# Patient Record
Sex: Male | Born: 1940 | Race: White | Hispanic: No | Smoking: Former smoker
Health system: Southern US, Community
[De-identification: ages and names within clinical notes are randomized; demographics above are authoritative.]

## PROBLEM LIST (undated history)

## (undated) DIAGNOSIS — F410 Panic disorder [episodic paroxysmal anxiety] without agoraphobia: Secondary | ICD-10-CM

## (undated) DIAGNOSIS — I471 Supraventricular tachycardia, unspecified: Secondary | ICD-10-CM

## (undated) DIAGNOSIS — R911 Solitary pulmonary nodule: Secondary | ICD-10-CM

## (undated) DIAGNOSIS — K219 Gastro-esophageal reflux disease without esophagitis: Secondary | ICD-10-CM

## (undated) DIAGNOSIS — K851 Biliary acute pancreatitis without necrosis or infection: Secondary | ICD-10-CM

## (undated) DIAGNOSIS — I10 Essential (primary) hypertension: Secondary | ICD-10-CM

## (undated) DIAGNOSIS — F419 Anxiety disorder, unspecified: Secondary | ICD-10-CM

## (undated) DIAGNOSIS — C801 Malignant (primary) neoplasm, unspecified: Secondary | ICD-10-CM

## (undated) DIAGNOSIS — A419 Sepsis, unspecified organism: Secondary | ICD-10-CM

## (undated) DIAGNOSIS — I5030 Unspecified diastolic (congestive) heart failure: Secondary | ICD-10-CM

## (undated) DIAGNOSIS — I214 Non-ST elevation (NSTEMI) myocardial infarction: Secondary | ICD-10-CM

## (undated) DIAGNOSIS — F1010 Alcohol abuse, uncomplicated: Secondary | ICD-10-CM

## (undated) DIAGNOSIS — I2510 Atherosclerotic heart disease of native coronary artery without angina pectoris: Secondary | ICD-10-CM

## (undated) DIAGNOSIS — D649 Anemia, unspecified: Secondary | ICD-10-CM

## (undated) DIAGNOSIS — E785 Hyperlipidemia, unspecified: Secondary | ICD-10-CM

## (undated) DIAGNOSIS — Z9049 Acquired absence of other specified parts of digestive tract: Secondary | ICD-10-CM

## (undated) DIAGNOSIS — J90 Pleural effusion, not elsewhere classified: Secondary | ICD-10-CM

## (undated) DIAGNOSIS — E039 Hypothyroidism, unspecified: Secondary | ICD-10-CM

## (undated) HISTORY — DX: Solitary pulmonary nodule: R91.1

## (undated) HISTORY — PX: KNEE ARTHROSCOPY: SUR90

## (undated) HISTORY — PX: THORACENTESIS: SHX235

## (undated) HISTORY — DX: Pleural effusion, not elsewhere classified: J90

## (undated) HISTORY — PX: CORONARY ARTERY BYPASS GRAFT: SHX141

## (undated) HISTORY — DX: Anxiety disorder, unspecified: F41.9

## (undated) HISTORY — DX: Alcohol abuse, uncomplicated: F10.10

## (undated) HISTORY — DX: Supraventricular tachycardia, unspecified: I47.10

## (undated) HISTORY — DX: Anemia, unspecified: D64.9

## (undated) HISTORY — DX: Hypercalcemia: E83.52

## (undated) HISTORY — DX: Panic disorder (episodic paroxysmal anxiety): F41.0

## (undated) HISTORY — DX: Sepsis, unspecified organism: A41.9

## (undated) HISTORY — DX: Supraventricular tachycardia: I47.1

## (undated) HISTORY — DX: Biliary acute pancreatitis without necrosis or infection: K85.10

## (undated) HISTORY — DX: Unspecified diastolic (congestive) heart failure: I50.30

---

## 2001-04-03 DIAGNOSIS — F410 Panic disorder [episodic paroxysmal anxiety] without agoraphobia: Secondary | ICD-10-CM | POA: Insufficient documentation

## 2002-06-16 DIAGNOSIS — K219 Gastro-esophageal reflux disease without esophagitis: Secondary | ICD-10-CM

## 2010-08-26 HISTORY — PX: COLON SURGERY: SHX602

## 2011-08-27 HISTORY — PX: COLOSTOMY REVERSAL: SHX5782

## 2013-11-17 ENCOUNTER — Emergency Department: Payer: Self-pay | Admitting: Emergency Medicine

## 2013-11-17 LAB — URINALYSIS, COMPLETE
BLOOD: NEGATIVE
Bacteria: NONE SEEN
Bilirubin,UR: NEGATIVE
Glucose,UR: NEGATIVE mg/dL (ref 0–75)
KETONE: NEGATIVE
Leukocyte Esterase: NEGATIVE
Nitrite: NEGATIVE
PROTEIN: NEGATIVE
Ph: 5 (ref 4.5–8.0)
RBC,UR: NONE SEEN /HPF (ref 0–5)
SPECIFIC GRAVITY: 1.005 (ref 1.003–1.030)
Squamous Epithelial: <1

## 2013-11-17 LAB — CBC WITH DIFFERENTIAL/PLATELET
Basophil #: 0 10*3/uL (ref 0.0–0.1)
Basophil %: 0.6 %
EOS ABS: 0.4 10*3/uL (ref 0.0–0.7)
Eosinophil %: 8.6 %
HCT: 40.7 % (ref 40.0–52.0)
HGB: 13.8 g/dL (ref 13.0–18.0)
LYMPHS ABS: 1.2 10*3/uL (ref 1.0–3.6)
Lymphocyte %: 25.7 %
MCH: 31.7 pg (ref 26.0–34.0)
MCHC: 34 g/dL (ref 32.0–36.0)
MCV: 93 fL (ref 80–100)
MONOS PCT: 13.5 %
Monocyte #: 0.6 x10 3/mm (ref 0.2–1.0)
NEUTROS ABS: 2.4 10*3/uL (ref 1.4–6.5)
Neutrophil %: 51.6 %
Platelet: 175 10*3/uL (ref 150–440)
RBC: 4.37 10*6/uL — AB (ref 4.40–5.90)
RDW: 12.4 % (ref 11.5–14.5)
WBC: 4.7 10*3/uL (ref 3.8–10.6)

## 2013-11-17 LAB — COMPREHENSIVE METABOLIC PANEL
ALBUMIN: 4.2 g/dL (ref 3.4–5.0)
ALK PHOS: 71 U/L
ANION GAP: 5 — AB (ref 7–16)
BUN: 11 mg/dL (ref 7–18)
Bilirubin,Total: 0.7 mg/dL (ref 0.2–1.0)
CALCIUM: 9.2 mg/dL (ref 8.5–10.1)
CHLORIDE: 104 mmol/L (ref 98–107)
Co2: 27 mmol/L (ref 21–32)
Creatinine: 1.16 mg/dL (ref 0.60–1.30)
EGFR (African American): >60
EGFR (Non-African Amer.): >60
Glucose: 127 mg/dL — ABNORMAL HIGH (ref 65–99)
Osmolality: 273 (ref 275–301)
Potassium: 3.6 mmol/L (ref 3.5–5.1)
SGOT(AST): 42 U/L — ABNORMAL HIGH (ref 15–37)
SGPT (ALT): 40 U/L (ref 12–78)
Sodium: 136 mmol/L (ref 136–145)
TOTAL PROTEIN: 8.1 g/dL (ref 6.4–8.2)

## 2014-02-22 DIAGNOSIS — R918 Other nonspecific abnormal finding of lung field: Secondary | ICD-10-CM | POA: Insufficient documentation

## 2014-02-22 DIAGNOSIS — F1011 Alcohol abuse, in remission: Secondary | ICD-10-CM | POA: Insufficient documentation

## 2014-02-22 DIAGNOSIS — F419 Anxiety disorder, unspecified: Secondary | ICD-10-CM | POA: Insufficient documentation

## 2014-02-22 DIAGNOSIS — Z85038 Personal history of other malignant neoplasm of large intestine: Secondary | ICD-10-CM | POA: Insufficient documentation

## 2014-02-22 DIAGNOSIS — Z87891 Personal history of nicotine dependence: Secondary | ICD-10-CM | POA: Insufficient documentation

## 2014-02-22 DIAGNOSIS — K432 Incisional hernia without obstruction or gangrene: Secondary | ICD-10-CM | POA: Insufficient documentation

## 2015-02-03 ENCOUNTER — Encounter: Payer: Self-pay | Admitting: Intensive Care

## 2015-02-03 ENCOUNTER — Emergency Department
Admission: EM | Admit: 2015-02-03 | Discharge: 2015-02-03 | Disposition: A | Discharge status: Home / Self Care | Payer: Medicare Other | Attending: Emergency Medicine | Admitting: Emergency Medicine

## 2015-02-03 ENCOUNTER — Emergency Department: Payer: Medicare Other

## 2015-02-03 DIAGNOSIS — Z87891 Personal history of nicotine dependence: Secondary | ICD-10-CM | POA: Insufficient documentation

## 2015-02-03 DIAGNOSIS — Y9289 Other specified places as the place of occurrence of the external cause: Secondary | ICD-10-CM | POA: Insufficient documentation

## 2015-02-03 DIAGNOSIS — S39012A Strain of muscle, fascia and tendon of lower back, initial encounter: Secondary | ICD-10-CM

## 2015-02-03 DIAGNOSIS — Y93H2 Activity, gardening and landscaping: Secondary | ICD-10-CM | POA: Insufficient documentation

## 2015-02-03 DIAGNOSIS — I10 Essential (primary) hypertension: Secondary | ICD-10-CM | POA: Insufficient documentation

## 2015-02-03 DIAGNOSIS — X58XXXA Exposure to other specified factors, initial encounter: Secondary | ICD-10-CM | POA: Insufficient documentation

## 2015-02-03 DIAGNOSIS — Y998 Other external cause status: Secondary | ICD-10-CM | POA: Insufficient documentation

## 2015-02-03 HISTORY — DX: Malignant (primary) neoplasm, unspecified: C80.1

## 2015-02-03 HISTORY — DX: Gastro-esophageal reflux disease without esophagitis: K21.9

## 2015-02-03 HISTORY — DX: Essential (primary) hypertension: I10

## 2015-02-03 HISTORY — DX: Acquired absence of other specified parts of digestive tract: Z90.49

## 2015-02-03 LAB — URINALYSIS COMPLETE WITH MICROSCOPIC (ARMC ONLY)
Bacteria, UA: NONE SEEN
Bilirubin Urine: NEGATIVE
GLUCOSE, UA: NEGATIVE mg/dL
Hgb urine dipstick: NEGATIVE
Ketones, ur: NEGATIVE mg/dL
LEUKOCYTES UA: NEGATIVE
NITRITE: NEGATIVE
PH: 6 (ref 5.0–8.0)
Protein, ur: NEGATIVE mg/dL
SQUAMOUS EPITHELIAL / LPF: NONE SEEN
Specific Gravity, Urine: 1.004 — ABNORMAL LOW (ref 1.005–1.030)

## 2015-02-03 MED ORDER — DIAZEPAM 2 MG PO TABS: 2.0000 mg | ORAL_TABLET | Freq: Three times a day (TID) | ORAL | Status: DC | PRN

## 2015-02-03 MED ORDER — HYDROCODONE-ACETAMINOPHEN 5-325 MG PO TABS
1.0000 | ORAL_TABLET | Freq: Once | ORAL | Status: AC
  Administered 2015-02-03: 1 via ORAL

## 2015-02-03 MED ORDER — HYDROCODONE-ACETAMINOPHEN 5-325 MG PO TABS: 1.0000 | ORAL_TABLET | ORAL | Status: DC | PRN

## 2015-02-03 MED ORDER — HYDROCODONE-ACETAMINOPHEN 5-325 MG PO TABS
ORAL_TABLET | ORAL | Status: AC
  Filled 2015-02-03: qty 1

## 2015-02-03 NOTE — ED Notes (Signed)
Pt states that he has pain in right lower back that radiated down his right leg. It started 3-4 days ago , and had never had this pain before . No numbness and tingling in the right leg. He feels stabbing pain when he lays on bed.

## 2015-02-03 NOTE — ED Provider Notes (Signed)
[facility] Emergency Department Provider Note  ____________________________________________  Time seen: 1611  I have reviewed the triage vital signs and the nursing notes.   HISTORY  Chief Complaint Back Pain   HPI Preston Bell is a 74 y.o. male is complaining of low back pain. He states approximately 3-4 days ago he was out doing some gardening where he was bent over for some time hoeing.  He denies any fall or trauma to his back. There was no heavy lifting done that day. Since that time he's had right lower back pain radiating into his right leg. He describes it as a stabbing pain when he is lying in bed. He has not had problems such as this in the past. He deniesUTI or kidney stones in the past. He has not taken any medication over-the-counter. He denies any loss of bowel or bladder function. Currently he rates his pain as 10 out of 10.   Past Medical History  Diagnosis Date  . Cancer   . History of colectomy   . Hypertension   . Irregular heart rhythm   . GERD (gastroesophageal reflux disease)     There are no active problems to display for this patient.   Past Surgical History  Procedure Laterality Date  . Colostomy reversal      Current Outpatient Rx  Name  Route  Sig  Dispense  Refill  . diazepam (VALIUM) 2 MG tablet   Oral   Take 1 tablet (2 mg total) by mouth every 8 (eight) hours as needed for muscle spasms.   9 tablet   0   . HYDROcodone-acetaminophen (NORCO/VICODIN) 5-325 MG per tablet   Oral   Take 1 tablet by mouth every 4 (four) hours as needed for moderate pain.   20 tablet   0     Allergies Review of patient's allergies indicates no known allergies.  History reviewed. No pertinent family history.  Social History History  Substance Use Topics  . Smoking status: Former Research scientist (life sciences)  . Smokeless tobacco: Never Used  . Alcohol Use: No    Review of Systems Constitutional: No fever/chills Cardiovascular: Denies chest  pain. Respiratory: Denies shortness of breath. Gastrointestinal: No abdominal pain.  No nausea, no vomiting.  No diarrhea.  No constipation. Genitourinary: Negative for dysuria. Musculoskeletal: Negative for back pain. Skin: Negative for rash. Neurological: Negative for headaches, focal weakness or numbness.  10-point ROS otherwise negative.  ____________________________________________   PHYSICAL EXAM:  VITAL SIGNS: ED Triage Vitals  Enc Vitals Group     BP --      Pulse --      Resp --      Temp --      Temp src --      SpO2 --      Weight --      Height --      Head Cir --      Peak Flow --      Pain Score 02/03/15 1546 10     Pain Loc --      Pain Edu? --      Excl. in [place]? --     Constitutional: Alert and oriented. Well appearing and in no acute distress. Eyes: Conjunctivae are normal. PERRL. EOMI. Head: Atraumatic. Nose: No congestion/rhinnorhea. Neck: No stridor.   Cardiovascular: Normal rate, regular rhythm. Grossly normal heart sounds.  Good peripheral circulation. Respiratory: Normal respiratory effort.  No retractions. Lungs CTAB. Gastrointestinal: Soft and nontender. No distention. No abdominal  bruits. No CVA tenderness. Musculoskeletal: No lower extremity tenderness nor edema.  No joint effusions. Examination of the back did not show any gross deformity. Range of motion is guarded secondary to pain. There is moderate tenderness on palpation of the right lateral muscle area. There is minimal tenderness on palpation of the vertebral lumbosacral area L5-S1 area. Straight leg raises are 90 and negative bilaterally. Strength in both legs are grossly equal bilaterally. Neurologic:  Normal speech and language. No gross focal neurologic deficits are appreciated. Speech is normal. No gait instability. Reflexes are 2+ bilaterally Skin:  Skin is warm, dry and intact. No rash noted. Psychiatric: Mood and affect are normal. Speech and behavior are  normal.  ____________________________________________   LABS (all labs ordered are listed, but only abnormal results are displayed)  Labs Reviewed  URINALYSIS COMPLETEWITH MICROSCOPIC ([place] ONLY) - Abnormal; Notable for the following:    Color, Urine STRAW (*)    APPearance CLEAR (*)    Specific Gravity, Urine 1.004 (*)    All other components within normal limits   RADIOLOGY  Lumbar x-rays showed no acute bony abnormality. Patient has degenerative changes on multiple levels. Thoracic spine x-rays showed no bony or abnormalities and minimal spine degenerative changes. ____________________________________________   PROCEDURES  Procedure(s) performed: None  Critical Care performed: No  ____________________________________________   INITIAL IMPRESSION / ASSESSMENT AND PLAN / ED COURSE  Pertinent labs & imaging results that were available during my care of the patient were reviewed by me and considered in my medical decision making (see chart for details).  Patient was started on Norco for pain and also Valium 2 mg as needed for muscle spasms. He is aware that the 2 medications can cause drowsiness increase his risk for fall. He is also sees ice or heat to his back as needed. He is to follow-up with his doctor at [facility] if any continued problems. ____________________________________________   FINAL CLINICAL IMPRESSION(S) / ED DIAGNOSES  Final diagnoses:  Back strain, initial encounter      Johnn Hai, PA-C 02/03/15 1744  Harvest Dark, MD 02/03/15 2325

## 2015-02-03 NOTE — Discharge Instructions (Signed)
° °  ICE OR HEAT TO MUSCLE AREAS AS NEEDED FOR COMFORT BE AWARE THAT THESE MEDICATIONS COULD CAUSE DROWSINESS AND INCREASE YOUR RISK OF FALLING RETURN TO ER IF ANY SEVERE WORSENING OF YOUR SYMPTOMS OR URGENT CONCERNS

## 2015-02-03 NOTE — ED Notes (Signed)
PT C/O right lower back pain that radiates to right thigh. Pt states he was gardening the day before back pain started. Pt denies urinary symptoms.

## 2015-06-14 DIAGNOSIS — R634 Abnormal weight loss: Secondary | ICD-10-CM | POA: Insufficient documentation

## 2015-09-27 DIAGNOSIS — I214 Non-ST elevation (NSTEMI) myocardial infarction: Secondary | ICD-10-CM

## 2015-09-27 HISTORY — DX: Non-ST elevation (NSTEMI) myocardial infarction: I21.4

## 2015-10-05 ENCOUNTER — Other Ambulatory Visit: Payer: Self-pay

## 2015-10-05 ENCOUNTER — Encounter: Payer: Self-pay | Admitting: Anesthesiology

## 2015-10-05 ENCOUNTER — Emergency Department: Payer: Medicare Other

## 2015-10-05 ENCOUNTER — Inpatient Hospital Stay
Admission: EM | Admit: 2015-10-05 | Discharge: 2015-10-09 | DRG: 871 | Disposition: A | Discharge status: Other Acute Inpatient Hospital | Payer: Medicare Other | Attending: Specialist | Admitting: Specialist

## 2015-10-05 ENCOUNTER — Inpatient Hospital Stay: Payer: Medicare Other

## 2015-10-05 ENCOUNTER — Inpatient Hospital Stay
Admit: 2015-10-05 | Discharge: 2015-10-05 | Disposition: A | Discharge status: Home / Self Care | Payer: Medicare Other | Attending: General Surgery | Admitting: General Surgery

## 2015-10-05 ENCOUNTER — Encounter
Admission: EM | Disposition: A | Discharge status: Other Acute Inpatient Hospital | Payer: Self-pay | Source: Home / Self Care | Attending: Specialist

## 2015-10-05 ENCOUNTER — Encounter: Payer: Self-pay | Admitting: Emergency Medicine

## 2015-10-05 DIAGNOSIS — Z8049 Family history of malignant neoplasm of other genital organs: Secondary | ICD-10-CM

## 2015-10-05 DIAGNOSIS — Z9049 Acquired absence of other specified parts of digestive tract: Secondary | ICD-10-CM | POA: Diagnosis not present

## 2015-10-05 DIAGNOSIS — E785 Hyperlipidemia, unspecified: Secondary | ICD-10-CM | POA: Diagnosis present

## 2015-10-05 DIAGNOSIS — A419 Sepsis, unspecified organism: Secondary | ICD-10-CM | POA: Diagnosis not present

## 2015-10-05 DIAGNOSIS — R197 Diarrhea, unspecified: Secondary | ICD-10-CM

## 2015-10-05 DIAGNOSIS — R109 Unspecified abdominal pain: Secondary | ICD-10-CM | POA: Insufficient documentation

## 2015-10-05 DIAGNOSIS — R778 Other specified abnormalities of plasma proteins: Secondary | ICD-10-CM

## 2015-10-05 DIAGNOSIS — K219 Gastro-esophageal reflux disease without esophagitis: Secondary | ICD-10-CM | POA: Diagnosis present

## 2015-10-05 DIAGNOSIS — Z79899 Other long term (current) drug therapy: Secondary | ICD-10-CM

## 2015-10-05 DIAGNOSIS — I471 Supraventricular tachycardia: Secondary | ICD-10-CM | POA: Diagnosis present

## 2015-10-05 DIAGNOSIS — Z7982 Long term (current) use of aspirin: Secondary | ICD-10-CM | POA: Diagnosis not present

## 2015-10-05 DIAGNOSIS — R7989 Other specified abnormal findings of blood chemistry: Secondary | ICD-10-CM

## 2015-10-05 DIAGNOSIS — R1084 Generalized abdominal pain: Secondary | ICD-10-CM | POA: Diagnosis not present

## 2015-10-05 DIAGNOSIS — K8000 Calculus of gallbladder with acute cholecystitis without obstruction: Secondary | ICD-10-CM | POA: Diagnosis present

## 2015-10-05 DIAGNOSIS — I340 Nonrheumatic mitral (valve) insufficiency: Secondary | ICD-10-CM | POA: Diagnosis not present

## 2015-10-05 DIAGNOSIS — A4189 Other specified sepsis: Principal | ICD-10-CM | POA: Diagnosis present

## 2015-10-05 DIAGNOSIS — I2510 Atherosclerotic heart disease of native coronary artery without angina pectoris: Secondary | ICD-10-CM | POA: Diagnosis not present

## 2015-10-05 DIAGNOSIS — Z8249 Family history of ischemic heart disease and other diseases of the circulatory system: Secondary | ICD-10-CM

## 2015-10-05 DIAGNOSIS — Z85038 Personal history of other malignant neoplasm of large intestine: Secondary | ICD-10-CM

## 2015-10-05 DIAGNOSIS — I252 Old myocardial infarction: Secondary | ICD-10-CM | POA: Diagnosis not present

## 2015-10-05 DIAGNOSIS — E860 Dehydration: Secondary | ICD-10-CM | POA: Diagnosis present

## 2015-10-05 DIAGNOSIS — Z801 Family history of malignant neoplasm of trachea, bronchus and lung: Secondary | ICD-10-CM | POA: Diagnosis not present

## 2015-10-05 DIAGNOSIS — I10 Essential (primary) hypertension: Secondary | ICD-10-CM | POA: Diagnosis present

## 2015-10-05 DIAGNOSIS — B961 Klebsiella pneumoniae [K. pneumoniae] as the cause of diseases classified elsewhere: Secondary | ICD-10-CM | POA: Diagnosis present

## 2015-10-05 DIAGNOSIS — Z87891 Personal history of nicotine dependence: Secondary | ICD-10-CM

## 2015-10-05 DIAGNOSIS — K819 Cholecystitis, unspecified: Secondary | ICD-10-CM

## 2015-10-05 DIAGNOSIS — I214 Non-ST elevation (NSTEMI) myocardial infarction: Secondary | ICD-10-CM | POA: Diagnosis not present

## 2015-10-05 DIAGNOSIS — R1110 Vomiting, unspecified: Secondary | ICD-10-CM

## 2015-10-05 HISTORY — DX: Hyperlipidemia, unspecified: E78.5

## 2015-10-05 LAB — URINALYSIS COMPLETE WITH MICROSCOPIC (ARMC ONLY)
BACTERIA UA: NONE SEEN
Bilirubin Urine: NEGATIVE
GLUCOSE, UA: NEGATIVE mg/dL
Ketones, ur: NEGATIVE mg/dL
Leukocytes, UA: NEGATIVE
Nitrite: NEGATIVE
Protein, ur: NEGATIVE mg/dL
Specific Gravity, Urine: 1.011 (ref 1.005–1.030)
pH: 7 (ref 5.0–8.0)

## 2015-10-05 LAB — BLOOD CULTURE ID PANEL (REFLEXED)
ACINETOBACTER BAUMANNII: NOT DETECTED
CANDIDA PARAPSILOSIS: NOT DETECTED
Candida albicans: NOT DETECTED
Candida glabrata: NOT DETECTED
Candida krusei: NOT DETECTED
Candida tropicalis: NOT DETECTED
Carbapenem resistance: NOT DETECTED
ENTEROCOCCUS SPECIES: NOT DETECTED
Enterobacter cloacae complex: NOT DETECTED
Enterobacteriaceae species: DETECTED — AB
Escherichia coli: NOT DETECTED
HAEMOPHILUS INFLUENZAE: NOT DETECTED
Klebsiella oxytoca: NOT DETECTED
Klebsiella pneumoniae: DETECTED — AB
LISTERIA MONOCYTOGENES: NOT DETECTED
METHICILLIN RESISTANCE: NOT DETECTED
Neisseria meningitidis: NOT DETECTED
PSEUDOMONAS AERUGINOSA: NOT DETECTED
Proteus species: NOT DETECTED
SERRATIA MARCESCENS: NOT DETECTED
STAPHYLOCOCCUS AUREUS BCID: NOT DETECTED
STREPTOCOCCUS SPECIES: NOT DETECTED
Staphylococcus species: NOT DETECTED
Streptococcus agalactiae: NOT DETECTED
Streptococcus pneumoniae: NOT DETECTED
Streptococcus pyogenes: NOT DETECTED
VANCOMYCIN RESISTANCE: NOT DETECTED

## 2015-10-05 LAB — COMPREHENSIVE METABOLIC PANEL
ALBUMIN: 4.8 g/dL (ref 3.5–5.0)
ALT: 173 U/L — ABNORMAL HIGH (ref 17–63)
ANION GAP: 21 — AB (ref 5–15)
AST: 190 U/L — ABNORMAL HIGH (ref 15–41)
Alkaline Phosphatase: 125 U/L (ref 38–126)
BUN: 21 mg/dL — ABNORMAL HIGH (ref 6–20)
CO2: 16 mmol/L — AB (ref 22–32)
Calcium: 9.8 mg/dL (ref 8.9–10.3)
Chloride: 104 mmol/L (ref 101–111)
Creatinine, Ser: 1.51 mg/dL — ABNORMAL HIGH (ref 0.61–1.24)
GFR calc Af Amer: 51 mL/min — ABNORMAL LOW (ref >60)
GFR calc non Af Amer: 44 mL/min — ABNORMAL LOW (ref >60)
GLUCOSE: 216 mg/dL — AB (ref 65–99)
POTASSIUM: 3.6 mmol/L (ref 3.5–5.1)
Sodium: 141 mmol/L (ref 135–145)
TOTAL PROTEIN: 8.4 g/dL — AB (ref 6.5–8.1)
Total Bilirubin: 1.9 mg/dL — ABNORMAL HIGH (ref 0.3–1.2)

## 2015-10-05 LAB — APTT: aPTT: 31 seconds (ref 24–36)

## 2015-10-05 LAB — CBC WITH DIFFERENTIAL/PLATELET
BASOS PCT: 0 %
Basophils Absolute: 0.1 10*3/uL (ref 0–0.1)
EOS ABS: 0 10*3/uL (ref 0–0.7)
Eosinophils Relative: 0 %
HCT: 39.3 % — ABNORMAL LOW (ref 40.0–52.0)
Hemoglobin: 13.2 g/dL (ref 13.0–18.0)
Lymphocytes Relative: 4 %
Lymphs Abs: 0.8 10*3/uL — ABNORMAL LOW (ref 1.0–3.6)
MCH: 32.4 pg (ref 26.0–34.0)
MCHC: 33.6 g/dL (ref 32.0–36.0)
MCV: 96.4 fL (ref 80.0–100.0)
MONO ABS: 0.7 10*3/uL (ref 0.2–1.0)
Monocytes Relative: 3 %
NEUTROS PCT: 93 %
Neutro Abs: 21.6 10*3/uL — ABNORMAL HIGH (ref 1.4–6.5)
PLATELETS: 238 10*3/uL (ref 150–440)
RBC: 4.07 MIL/uL — ABNORMAL LOW (ref 4.40–5.90)
RDW: 14.1 % (ref 11.5–14.5)
WBC: 23.1 10*3/uL — ABNORMAL HIGH (ref 3.8–10.6)

## 2015-10-05 LAB — GLUCOSE, CAPILLARY: GLUCOSE-CAPILLARY: 124 mg/dL — AB (ref 65–99)

## 2015-10-05 LAB — BASIC METABOLIC PANEL
Anion gap: 7 (ref 5–15)
BUN: 19 mg/dL (ref 6–20)
CALCIUM: 7.9 mg/dL — AB (ref 8.9–10.3)
CO2: 24 mmol/L (ref 22–32)
CREATININE: 1.07 mg/dL (ref 0.61–1.24)
Chloride: 105 mmol/L (ref 101–111)
Glucose, Bld: 151 mg/dL — ABNORMAL HIGH (ref 65–99)
Potassium: 4.1 mmol/L (ref 3.5–5.1)
SODIUM: 136 mmol/L (ref 135–145)

## 2015-10-05 LAB — LACTIC ACID, PLASMA
LACTIC ACID, VENOUS: 3.2 mmol/L — AB (ref 0.5–2.0)
Lactic Acid, Venous: 8.9 mmol/L (ref 0.5–2.0)

## 2015-10-05 LAB — TROPONIN I
TROPONIN I: 7.74 ng/mL — AB (ref <0.031)
Troponin I: 0.04 ng/mL — ABNORMAL HIGH (ref <0.031)
Troponin I: 13.17 ng/mL — ABNORMAL HIGH (ref <0.031)

## 2015-10-05 LAB — PROTIME-INR
INR: 1.18
PROTHROMBIN TIME: 15.2 s — AB (ref 11.4–15.0)

## 2015-10-05 LAB — MAGNESIUM: MAGNESIUM: 1.1 mg/dL — AB (ref 1.7–2.4)

## 2015-10-05 LAB — LIPASE, BLOOD: Lipase: 366 U/L — ABNORMAL HIGH (ref 11–51)

## 2015-10-05 SURGERY — LAPAROSCOPIC CHOLECYSTECTOMY WITH INTRAOPERATIVE CHOLANGIOGRAM
Anesthesia: Choice

## 2015-10-05 MED ORDER — PIPERACILLIN-TAZOBACTAM 3.375 G IVPB
3.3750 g | Freq: Once | INTRAVENOUS | Status: AC
  Administered 2015-10-05: 3.375 g via INTRAVENOUS
  Filled 2015-10-05: qty 50

## 2015-10-05 MED ORDER — ATORVASTATIN CALCIUM 20 MG PO TABS
40.0000 mg | ORAL_TABLET | Freq: Every day | ORAL | Status: DC
  Administered 2015-10-05 – 2015-10-08 (×4): 40 mg via ORAL
  Filled 2015-10-05 (×4): qty 2

## 2015-10-05 MED ORDER — LIDOCAINE HCL (PF) 1 % IJ SOLN
INTRAMUSCULAR | Status: DC | PRN
  Administered 2015-10-05: 10 mL

## 2015-10-05 MED ORDER — DIPHENHYDRAMINE HCL 50 MG/ML IJ SOLN: 12.5000 mg | Freq: Four times a day (QID) | INTRAMUSCULAR | Status: DC | PRN

## 2015-10-05 MED ORDER — LACTATED RINGERS IV BOLUS (SEPSIS)
500.0000 mL | Freq: Once | INTRAVENOUS | Status: AC
  Administered 2015-10-05: 500 mL via INTRAVENOUS

## 2015-10-05 MED ORDER — MIRTAZAPINE 15 MG PO TABS
15.0000 mg | ORAL_TABLET | Freq: Every day | ORAL | Status: DC
  Administered 2015-10-05 – 2015-10-08 (×4): 15 mg via ORAL
  Filled 2015-10-05 (×4): qty 1

## 2015-10-05 MED ORDER — FENTANYL CITRATE (PF) 100 MCG/2ML IJ SOLN
INTRAMUSCULAR | Status: AC | PRN
  Administered 2015-10-05: 25 ug via INTRAVENOUS
  Administered 2015-10-05: 50 ug via INTRAVENOUS
  Administered 2015-10-05: 25 ug via INTRAVENOUS

## 2015-10-05 MED ORDER — DILTIAZEM HCL 25 MG/5ML IV SOLN
10.0000 mg | Freq: Once | INTRAVENOUS | Status: AC
  Administered 2015-10-05: 10 mg via INTRAVENOUS
  Filled 2015-10-05: qty 5

## 2015-10-05 MED ORDER — HYDROMORPHONE HCL 1 MG/ML IJ SOLN
1.0000 mg | Freq: Once | INTRAMUSCULAR | Status: AC
  Administered 2015-10-05: 1 mg via INTRAVENOUS

## 2015-10-05 MED ORDER — ASPIRIN 325 MG PO TABS
325.0000 mg | ORAL_TABLET | Freq: Once | ORAL | Status: AC
  Administered 2015-10-05: 325 mg via ORAL

## 2015-10-05 MED ORDER — LISINOPRIL 20 MG PO TABS
40.0000 mg | ORAL_TABLET | Freq: Every day | ORAL | Status: DC
  Administered 2015-10-06: 40 mg via ORAL
  Filled 2015-10-05 (×2): qty 2

## 2015-10-05 MED ORDER — HEPARIN BOLUS VIA INFUSION
4000.0000 [IU] | Freq: Once | INTRAVENOUS | Status: AC
  Administered 2015-10-05: 4000 [IU] via INTRAVENOUS
  Filled 2015-10-05: qty 4000

## 2015-10-05 MED ORDER — MIDAZOLAM HCL 5 MG/5ML IJ SOLN
INTRAMUSCULAR | Status: AC
  Filled 2015-10-05: qty 5

## 2015-10-05 MED ORDER — MORPHINE SULFATE (PF) 4 MG/ML IV SOLN
4.0000 mg | Freq: Once | INTRAVENOUS | Status: AC
  Administered 2015-10-05: 4 mg via INTRAVENOUS
  Filled 2015-10-05: qty 1

## 2015-10-05 MED ORDER — SODIUM CHLORIDE 0.9 % IV SOLN
INTRAVENOUS | Status: DC
  Administered 2015-10-05 – 2015-10-08 (×4): via INTRAVENOUS

## 2015-10-05 MED ORDER — MORPHINE SULFATE (PF) 4 MG/ML IV SOLN
4.0000 mg | INTRAVENOUS | Status: DC | PRN
  Administered 2015-10-05 – 2015-10-08 (×7): 4 mg via INTRAVENOUS
  Filled 2015-10-05 (×7): qty 1

## 2015-10-05 MED ORDER — ASPIRIN EC 81 MG PO TBEC
81.0000 mg | DELAYED_RELEASE_TABLET | Freq: Every day | ORAL | Status: DC
  Administered 2015-10-05: 325 mg via ORAL
  Administered 2015-10-05 – 2015-10-08 (×4): 81 mg via ORAL
  Filled 2015-10-05 (×4): qty 1

## 2015-10-05 MED ORDER — VANCOMYCIN HCL IN DEXTROSE 1-5 GM/200ML-% IV SOLN
1000.0000 mg | Freq: Once | INTRAVENOUS | Status: AC
  Administered 2015-10-05: 1000 mg via INTRAVENOUS
  Filled 2015-10-05: qty 200

## 2015-10-05 MED ORDER — ADENOSINE 6 MG/2ML IV SOLN
INTRAVENOUS | Status: AC
  Administered 2015-10-05: 6 mg via INTRAVENOUS
  Filled 2015-10-05: qty 2

## 2015-10-05 MED ORDER — LACTATED RINGERS IV SOLN
INTRAVENOUS | Status: DC
  Administered 2015-10-05 – 2015-10-06 (×3): via INTRAVENOUS

## 2015-10-05 MED ORDER — IOHEXOL 240 MG/ML SOLN
25.0000 mL | Freq: Once | INTRAMUSCULAR | Status: AC | PRN
  Administered 2015-10-05: 25 mL via ORAL

## 2015-10-05 MED ORDER — FENTANYL CITRATE (PF) 100 MCG/2ML IJ SOLN
INTRAMUSCULAR | Status: AC
  Filled 2015-10-05: qty 4

## 2015-10-05 MED ORDER — PANTOPRAZOLE SODIUM 40 MG IV SOLR
40.0000 mg | Freq: Every day | INTRAVENOUS | Status: DC
  Administered 2015-10-05 – 2015-10-08 (×4): 40 mg via INTRAVENOUS
  Filled 2015-10-05 (×4): qty 40

## 2015-10-05 MED ORDER — HYDRALAZINE HCL 20 MG/ML IJ SOLN
10.0000 mg | INTRAMUSCULAR | Status: DC | PRN
Start: 2015-10-05 — End: 2015-10-09

## 2015-10-05 MED ORDER — SODIUM CHLORIDE 0.9 % IV BOLUS (SEPSIS)
1310.0000 mL | Freq: Once | INTRAVENOUS | Status: AC
  Administered 2015-10-05: 1000 mL via INTRAVENOUS

## 2015-10-05 MED ORDER — DIPHENHYDRAMINE HCL 12.5 MG/5ML PO ELIX
12.5000 mg | ORAL_SOLUTION | Freq: Four times a day (QID) | ORAL | Status: DC | PRN
  Filled 2015-10-05: qty 5

## 2015-10-05 MED ORDER — IOHEXOL 300 MG/ML  SOLN
30.0000 mL | Freq: Once | INTRAMUSCULAR | Status: AC | PRN
  Administered 2015-10-05: 5 mL

## 2015-10-05 MED ORDER — SODIUM CHLORIDE 0.9 % IV BOLUS (SEPSIS)
1000.0000 mL | Freq: Once | INTRAVENOUS | Status: AC
  Administered 2015-10-05: 1000 mL via INTRAVENOUS

## 2015-10-05 MED ORDER — MAGNESIUM SULFATE 4 GM/100ML IV SOLN
4.0000 g | Freq: Once | INTRAVENOUS | Status: AC
  Administered 2015-10-05: 4 g via INTRAVENOUS
  Filled 2015-10-05: qty 100

## 2015-10-05 MED ORDER — ENOXAPARIN SODIUM 40 MG/0.4ML ~~LOC~~ SOLN: 40.0000 mg | SUBCUTANEOUS | Status: DC

## 2015-10-05 MED ORDER — ONDANSETRON HCL 4 MG/2ML IJ SOLN
4.0000 mg | Freq: Once | INTRAMUSCULAR | Status: AC
  Administered 2015-10-05: 4 mg via INTRAVENOUS
  Filled 2015-10-05: qty 2

## 2015-10-05 MED ORDER — PANTOPRAZOLE SODIUM 40 MG PO TBEC: 40.0000 mg | DELAYED_RELEASE_TABLET | Freq: Every day | ORAL | Status: DC

## 2015-10-05 MED ORDER — ADENOSINE 6 MG/2ML IV SOLN
6.0000 mg | Freq: Once | INTRAVENOUS | Status: AC
  Administered 2015-10-05: 6 mg via INTRAVENOUS

## 2015-10-05 MED ORDER — IOHEXOL 300 MG/ML  SOLN
80.0000 mL | Freq: Once | INTRAMUSCULAR | Status: AC | PRN
  Administered 2015-10-05: 80 mL via INTRAVENOUS

## 2015-10-05 MED ORDER — PIPERACILLIN-TAZOBACTAM 3.375 G IVPB
3.3750 g | Freq: Three times a day (TID) | INTRAVENOUS | Status: AC
  Administered 2015-10-05 – 2015-10-06 (×4): 3.375 g via INTRAVENOUS
  Filled 2015-10-05 (×5): qty 50

## 2015-10-05 MED ORDER — METOPROLOL SUCCINATE ER 50 MG PO TB24
50.0000 mg | ORAL_TABLET | Freq: Every day | ORAL | Status: DC
  Administered 2015-10-05 – 2015-10-06 (×2): 50 mg via ORAL
  Filled 2015-10-05 (×3): qty 1

## 2015-10-05 MED ORDER — HEPARIN (PORCINE) IN NACL 100-0.45 UNIT/ML-% IJ SOLN
1550.0000 [IU]/h | INTRAMUSCULAR | Status: DC
  Administered 2015-10-05: 950 [IU]/h via INTRAVENOUS
  Administered 2015-10-07: 1400 [IU]/h via INTRAVENOUS
  Administered 2015-10-08 – 2015-10-09 (×2): 1550 [IU]/h via INTRAVENOUS
  Filled 2015-10-05 (×10): qty 250

## 2015-10-05 MED ORDER — ONDANSETRON HCL 4 MG/2ML IJ SOLN
4.0000 mg | Freq: Four times a day (QID) | INTRAMUSCULAR | Status: DC | PRN
  Administered 2015-10-06: 4 mg via INTRAVENOUS
  Filled 2015-10-05: qty 2

## 2015-10-05 MED ORDER — MIDAZOLAM HCL 5 MG/5ML IJ SOLN
INTRAMUSCULAR | Status: AC | PRN
  Administered 2015-10-05: 1 mg via INTRAVENOUS
  Administered 2015-10-05: 0.5 mg via INTRAVENOUS
  Administered 2015-10-05: 2 mg via INTRAVENOUS

## 2015-10-05 MED ORDER — HYDROMORPHONE HCL 1 MG/ML IJ SOLN
INTRAMUSCULAR | Status: AC
  Administered 2015-10-05: 1 mg via INTRAVENOUS
  Filled 2015-10-05: qty 1

## 2015-10-05 MED ORDER — ONDANSETRON 4 MG PO TBDP
4.0000 mg | ORAL_TABLET | Freq: Four times a day (QID) | ORAL | Status: DC | PRN
  Filled 2015-10-05: qty 1

## 2015-10-05 NOTE — Consult Note (Signed)
[place] [facility] [place] NAME: Preston Bell    MR#:  [id number]  DATE OF BIRTH:  Nov 23, 1940  DATE OF CONSULT:  10/05/2015  PRIMARY CARE PHYSICIAN: Pcp Not In System   REQUESTING/REFERRING PHYSICIAN: Dr. Adonis Huguenin  CHIEF COMPLAINT:   Chief Complaint  Patient presents with  . Abdominal Pain    HISTORY OF PRESENT ILLNESS:  Preston Bell  is a 75 y.o. male with a known history of colon cancer, hypertension, GERD, history of tobacco and alcohol abuse, anxiety who presented to the hospital complaining of right upper quadrant abdominal pain associated with nausea and vomiting. Patient's abdominal pain and nausea vomiting began yesterday and have progressively gotten worse and therefore he came to the ER for further evaluation. In the emergency room patient underwent a CT scan of the abdomen and pelvis which was suggestive of acute cholecystitis. She was noted to be in SVT and unstable for a laparoscopic cholecystectomy and therefore was taken for a percutaneous cholecystostomy tube drainage. Patient post percutaneous drainage feels better and his heart rate has improved. Hospitalist services were contacted for medical management. Patient presently denies any chest pain, shortness of breath, nausea, vomiting, fever, chills, syncope or any other associated symptoms presently.  PAST MEDICAL HISTORY:   Past Medical History  Diagnosis Date  . Cancer ([place])   . History of colectomy   . Hypertension   . Irregular heart rhythm   . GERD (gastroesophageal reflux disease)     PAST SURGICAL HISTOIRY:   Past Surgical History  Procedure Laterality Date  . Colostomy reversal      SOCIAL HISTORY:   Social History  Substance Use Topics  . Smoking status: Former Smoker -- 1.00 packs/day for 30 years  . Smokeless tobacco: Never Used  . Alcohol Use: No    FAMILY HISTORY:   Family History  Problem Relation Age of Onset  . Cervical cancer Mother    . Lung cancer Father   . Lung cancer Brother     DRUG ALLERGIES:  No Known Allergies  REVIEW OF SYSTEMS:   Review of Systems  Constitutional: Negative for fever and weight loss.  HENT: Negative for congestion, nosebleeds and tinnitus.   Eyes: Negative for blurred vision, double vision and redness.  Respiratory: Negative for cough, hemoptysis and shortness of breath.   Cardiovascular: Negative for chest pain, orthopnea, leg swelling and PND.  Gastrointestinal: Positive for abdominal pain (right upper quadrant). Negative for diarrhea and melena.  Genitourinary: Negative for dysuria, urgency and hematuria.  Musculoskeletal: Negative for joint pain and falls.  Neurological: Negative for dizziness, tingling, sensory change, focal weakness, seizures, weakness and headaches.  Endo/Heme/Allergies: Negative for polydipsia. Does not bruise/bleed easily.  Psychiatric/Behavioral: Negative for depression and memory loss. The patient is not nervous/anxious.      MEDICATIONS AT HOME:   Prior to Admission medications   Medication Sig Start Date End Date Taking? Authorizing Provider  aspirin EC 81 MG tablet Take 81 mg by mouth daily. 06/14/15 02/20/29 Yes Historical Provider, MD  atorvastatin (LIPITOR) 20 MG tablet Take 40 mg by mouth daily. 06/14/15 06/13/16 Yes Historical Provider, MD  lisinopril (PRINIVIL,ZESTRIL) 40 MG tablet Take 40 mg by mouth daily. 06/14/15 06/13/16 Yes Historical Provider, MD  metoprolol succinate (TOPROL XL) 50 MG 24 hr tablet Take 50 mg by mouth daily. 06/14/15 06/13/16 Yes Historical Provider, MD  mirtazapine (REMERON) 15 MG tablet Take 15 mg by mouth at bedtime. 06/14/15 06/13/16 Yes Historical Provider, MD  omeprazole (PRILOSEC) 20 MG capsule Take 20 mg by mouth daily. 06/14/15  Yes Historical Provider, MD      VITAL SIGNS:  Blood pressure 114/61, pulse 116, temperature 98.4 F (36.9 C), temperature source Oral, resp. rate 28, height 6\' 2"  (1.88 m), weight 78 kg  (171 lb 15.3 oz), SpO2 94 %.  PHYSICAL EXAMINATION:  GENERAL:  75 y.o.-year-old patient lying in the bed with no acute distress.  EYES: Pupils equal, round, reactive to light and accommodation. No scleral icterus. Extraocular muscles intact.  HEENT: Head atraumatic, normocephalic. Oropharynx and nasopharynx clear.  NECK:  Supple, no jugular venous distention. No thyroid enlargement, no tenderness.  LUNGS: Normal breath sounds bilaterally, no wheezing, rales, rhonchi . No use of accessory muscles of respiration.  CARDIOVASCULAR: S1, S2, RRR. No murmurs, rubs, gallops, clicks.  ABDOMEN: Soft, Tender in the RUQ, no rebound, rigidity, nondistended. Bowel sounds present. No organomegaly or mass. + abdominal wall hernia from previous colon cancer surgery which is reducible  + right sided percutaneous cholecystostomy tube with bilious drainage EXTREMITIES: No pedal edema, cyanosis, or clubbing.  NEUROLOGIC: Cranial nerves II through XII are intact. No focal motor or sensory deficits appreciated bilaterally  PSYCHIATRIC: The patient is alert and oriented x 3. Good affect SKIN: No obvious rash, lesion, or ulcer.   LABORATORY PANEL:   CBC  Recent Labs Lab 10/05/15 0839  WBC 23.1*  HGB 13.2  HCT 39.3*  PLT 238   ------------------------------------------------------------------------------------------------------------------  Chemistries   Recent Labs Lab 10/05/15 0839  NA 141  K 3.6  CL 104  CO2 16*  GLUCOSE 216*  BUN 21*  CREATININE 1.51*  CALCIUM 9.8  AST 190*  ALT 173*  ALKPHOS 125  BILITOT 1.9*   ------------------------------------------------------------------------------------------------------------------  Cardiac Enzymes  Recent Labs Lab 10/05/15 0839  TROPONINI 0.04*   ------------------------------------------------------------------------------------------------------------------  RADIOLOGY:  Dg Abd 1 View  10/05/2015  CLINICAL DATA:  Abdominal pain.   Colon cancer.  Possible sepsis. EXAM: ABDOMEN - 1 VIEW COMPARISON:  Chest radiograph of same date. FINDINGS: Portable AP upright view of the abdomen pelvis. No free intraperitoneal air. A right abdominal air-fluid level could be within the proximal small bowel or distal stomach. Mild paucity of bowel gas otherwise. Exclusion of the pelvis. No abnormal abdominal calcifications. No appendicolith. Convex left lumbar spine curvature with advanced spondylosis. IMPRESSION: Nonspecific bowel gas pattern. Relative paucity of bowel gas with an isolated right abdominal fluid level. No free intraperitoneal air identified Electronically Signed   By: Abigail Miyamoto M.D.   On: 10/05/2015 09:45   [place] Intraoperative  10/05/2015  CLINICAL DATA:  Ultrasound was provided for use by the ordering physician, and a technical charge was applied by the performing facility.  No radiologist interpretation/professional services rendered.   Ct Abdomen Pelvis W Contrast  10/05/2015  CLINICAL DATA:  Abdominal pain since 8 o'clock last night. Vomiting and diarrhea. EXAM: CT ABDOMEN AND PELVIS WITH CONTRAST TECHNIQUE: Multidetector CT imaging of the abdomen and pelvis was performed using the standard protocol following bolus administration of intravenous contrast. CONTRAST:  16mL OMNIPAQUE IOHEXOL 300 MG/ML  SOLN COMPARISON:  None. FINDINGS: Lower chest: Mild bibasilar atelectasis. The mild right basilar calcified pleural plaque with a 4.2 x 1.5 cm nodular area adjacently which may reflect focal pleural thickening versus fibrosis. Normal heart size. Mild coronary artery atherosclerotic cyst in the LAD. Hepatobiliary: Normal liver. Cholelithiasis with gallbladder wall thickening with pericholecystic fluid and surrounding inflammatory changes . Pancreas: Normal. Spleen: Normal. Adrenals/Urinary Tract: Normal adrenal glands.  Normal kidneys. Normal bladder. Stomach/Bowel: No bowel wall thickening or dilatation. No pneumatosis, pneumoperitoneum or  portal venous gas. Small amount of abdominal free fluid extending into the right pericolic gutter. Vascular/Lymphatic: Normal caliber abdominal aorta with atherosclerosis. No lymphadenopathy. Other: No fluid collection or hematoma. Musculoskeletal: No lytic or sclerotic osseous lesion. No aggressive osseous abnormality. 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 IMPRESSION: 1. Cholelithiasis with gallbladder wall thickening with pericholecystic fluid and surrounding inflammatory changes most concerning for acute cholecystitis. 2. Mild right basilar calcified pleural plaque with a 4.2 x 1.5 cm nodular area adjacently which may reflect focal pleural thickening versus fibrosis. Recommend follow-up CT chest in 3-6 months. Electronically Signed   By: Kathreen Devoid   On: 10/05/2015 11:11   Ir Perc Cholecystostomy  10/05/2015  CLINICAL DATA:  Acute calculus cholecystitis. The patient is at high risk for immediate cholecystectomy and requires percutaneous cholecystostomy tube placement. EXAM: PERCUTANEOUS CHOLECYSTOSTOMY COMPARISON:  CT of the abdomen earlier today ANESTHESIA/SEDATION: 3.5 mg IV Versed; 125 mcg IV Fentanyl. Total Moderate Sedation Time 15 minutes. The patient's level of consciousness and physiologic status were continuously monitored during the procedure by Radiology nursing. CONTRAST:  10mL OMNIPAQUE IOHEXOL 300 MG/ML  SOLN MEDICATIONS: No additional medications. FLUOROSCOPY TIME:  30 seconds. PROCEDURE: The procedure, risks, benefits, and alternatives were explained to the patient. Questions regarding the procedure were encouraged and answered. The patient understands and consents to the procedure. A time-out was performed prior to the procedure. Ultrasound was used to localize the gallbladder. The right abdominal wall was prepped with chlorhexidine in a sterile fashion, and a sterile drape was applied covering the operative field. A sterile  gown and sterile gloves were used for the procedure. Local anesthesia was provided with 1% Lidocaine. Ultrasound image documentation was performed. Fluoroscopy during the procedure and fluoro spot radiograph confirms appropriate catheter position. Under direct ultrasound guidance, a 21 gauge needle was advanced into the gallbladder lumen. Aspiration was performed and a bile sample sent for culture studies. A small amount of diluted contrast material was injected. A guide wire was then advanced into the gallbladder. A transitional dilator was placed. Percutaneous tract dilatation was then performed over a guide wire to 10-French. A 10-French pigtail drainage catheter was then advanced into the gallbladder lumen under fluoroscopy. Catheter was formed and injected with contrast material to confirm position. The catheter was flushed and connected to a gravity drainage bag. It was secured at the skin with a Prolene retention suture and Stat-Lock device. COMPLICATIONS: None FINDINGS: A cholecystostomy tube was advanced into the gallbladder lumen and formed. It is now draining bile. This tube will be left to gravity drainage. IMPRESSION: Percutaneous cholecystostomy with placement of 10-French drainage catheter into the gallbladder lumen. This was left to gravity drainage. Electronically Signed   By: Aletta Edouard M.D.   On: 10/05/2015 16:33   Dg Chest Portable 1 View  10/05/2015  CLINICAL DATA:  Vomiting. EXAM: PORTABLE CHEST 1 VIEW COMPARISON:  None. FINDINGS: The heart size and mediastinal contours are within normal limits. Both lungs are clear. No pneumothorax or pleural effusion is noted. The visualized skeletal structures are unremarkable. IMPRESSION: No acute cardiopulmonary abnormality seen. Electronically Signed   By: Marijo Conception, M.D.   On: 10/05/2015 09:44     IMPRESSION AND PLAN:   75 year old male with past medical history of colon cancer, hypertension, GERD, anxiety, history of alcohol and  tobacco abuse who presents to the  hospital with right upper quadrant abdominal pain nausea vomiting.    #1 acute cholecystitis-this is the cause of patient's right upper quadrant abdominal pain nausea vomiting. -Patient is status post percutaneous cholecystostomy tube placement. -Continue IV antibiotics (Zosyn) and supportive care as per general surgery. Patient likely will need a laparoscopic cholecystectomy in the next couple weeks.  #2 sepsis-due to acute cholecystitis. -Continue IV Zosyn, patient is status post percutaneous drainage. Follow blood cultures and fever curve.  #3 SVT/sinus tachycardia-patient apparently had SVT with heart rates in the 160s but that has significantly improved with some as needed Cardizem/Adenosine and IV fluids and treatment for underlying sepsis. -Currently patient is in sinus tachycardia and is probably related to underlying infection and dehydration. -Continue IV fluid, IV abx (Zosyn) and follow HR.  - there is a Cardiology consult pending.   #4. Elevated Troponin - likely in the setting of demand ischemia from sepsis.  - cycle cardiac markers and will monitor.   #5. Essential hypertension - cont. Metoprolol, Lisinopril if BP can tolerate it.   #6. Hyperlipidemia - cont. Atorvastatin  #7. GERD - cont. Protonix.    All the records are reviewed and case discussed with Consulting provider. Management plans discussed with the patient, family and they are in agreement.  CODE STATUS: Full  TOTAL TIME TAKING CARE OF THIS PATIENT: 45 minutes.    Henreitta Leber M.D on 10/05/2015 at 6:33 PM  Between 7am to 6pm - Pager - [phone]  After 6pm go to [email or web address] - password [email or web address] [place] [facility]  Office  [phone]  CC: Primary care Physician: Pcp Not In System

## 2015-10-05 NOTE — ED Notes (Signed)
Reported lactic acid of 3.2 to Dr Edd Fabian

## 2015-10-05 NOTE — Progress Notes (Signed)
Antibiotic follow up note.  [facility] returned K. pneumoniae  from blood cultures, KPC (-) Patient os already on Zosyn for cholecystitis which should also cover this organism. No change in therapy per conversation with hospitalist.  Sim Boast, PharmD, BCPS  10/05/2015

## 2015-10-05 NOTE — Sedation Documentation (Signed)
Was logged out of computer/procedure log prior to completing documentation.  Patient stable.  Procedure uncomplicated.

## 2015-10-05 NOTE — Procedures (Signed)
Interventional Radiology Procedure Note  Procedure:  Percutaneous cholecystostomy  Complications:  None  Estimated Blood Loss: < 10 mL  Dark colored bile in gallbladder.  10 Fr percutaneous drain placed into gallbladder. Drain attached to gravity bag drainage.  Venetia Night. Kathlene Cote, M.D Pager:  [phone]

## 2015-10-05 NOTE — Care Management (Signed)
Patient presented to the ED with abd pain.  History of colon cancer w colostomy.  Radiology findings concerning for a cute cholecystitis.  Also found to have heart rate in the 150's. Patient is  now status post percutaneous drainage on 2/9.  Admitted to icu with sx of spepsis

## 2015-10-05 NOTE — Progress Notes (Signed)
ANTICOAGULATION CONSULT NOTE - Initial Consult  Pharmacy Consult for heparin drip Indication: ACS/STEMI  No Known Allergies  Patient Measurements: Height: 6\' 2"  (188 cm) Weight: 171 lb 15.3 oz (78 kg) IBW/kg (Calculated) : 82.2 Heparin Dosing Weight: 78kg  Vital Signs: Temp: 98 F (36.7 C) (02/09 1900) Temp Source: Axillary (02/09 1900) BP: 112/60 mmHg (02/09 2110) Pulse Rate: 114 (02/09 2110)  Labs:  Recent Labs  10/05/15 0839 10/05/15 1858 10/05/15 2141  HGB 13.2  --   --   HCT 39.3*  --   --   PLT 238  --   --   APTT 31  --   --   LABPROT 15.2*  --   --   INR 1.18  --   --   CREATININE 1.51*  --  1.07  TROPONINI 0.04* 7.74*  --     Estimated Creatinine Clearance: 66.8 mL/min (by C-G formula based on Cr of 1.07).   Medical History: Past Medical History  Diagnosis Date  . Cancer ([place])   . History of colectomy   . Hypertension   . Irregular heart rhythm   . GERD (gastroesophageal reflux disease)     Medications:    Assessment: Hgb 13.2  plt 230 INR 1.18  aPTT 31 No outpatient anticoag in med rec.  Goal of Therapy:  Heparin level 0.3-0.7 units/ml Monitor platelets by anticoagulation protocol: Yes   Plan:  4000 unit bolus and initial rate of 950 units/hr. First anti-Xa 8 hours after start of infusion.  Ridhaan Dreibelbis S 10/05/2015,10:39 PM

## 2015-10-05 NOTE — H&P (Signed)
Patient ID: Preston Bell, male   DOB: 02/03/1941, 75 y.o.   MRN: [id number]  CC: ABDOMINAL PAIN  HPI Preston Bell is a 75 y.o. male presents emergency department with a one-day history of breast or worsening abdominal pain, nausea, vomiting. Patient states the pain started mid abdomen moves all way up to the right side. Currently hurts over the entirety of the abdomen. He is been unable to hold anything down by mouth 24 hours. Patient states she's never had anything like this before. Patient describes pain as sharp and coming in and out of waves. Patient is also had diarrhea today had a normal bowel movement yesterday. He admits to chills but has not checked her temperature for fever. He denies any chest pain or shortness of breath  HPI  Past Medical History  Diagnosis Date  . Cancer ([place])   . History of colectomy   . Hypertension   . Irregular heart rhythm   . GERD (gastroesophageal reflux disease)     Past Surgical History  Procedure Laterality Date  . Colostomy reversal      No family history on file.  Social History Social History  Substance Use Topics  . Smoking status: Former Research scientist (life sciences)  . Smokeless tobacco: Never Used  . Alcohol Use: No    No Known Allergies  No current facility-administered medications for this encounter.   Current Outpatient Prescriptions  Medication Sig Dispense Refill  . aspirin EC 81 MG tablet Take 81 mg by mouth daily.    Marland Kitchen atorvastatin (LIPITOR) 20 MG tablet Take 40 mg by mouth daily.    Marland Kitchen lisinopril (PRINIVIL,ZESTRIL) 40 MG tablet Take 40 mg by mouth daily.    . metoprolol succinate (TOPROL XL) 50 MG 24 hr tablet Take 50 mg by mouth daily.    . mirtazapine (REMERON) 15 MG tablet Take 15 mg by mouth at bedtime.    Marland Kitchen omeprazole (PRILOSEC) 20 MG capsule Take 20 mg by mouth daily.       Review of Systems A tie point review of systems was asked and was negative except for the findings documented in the history of present illness  Physical  Exam Blood pressure 136/64, pulse 126, temperature 97.6 F (36.4 C), resp. rate 20, height 6\' 2"  (1.88 m), weight 81.647 kg (180 lb), SpO2 95 %. CONSTITUTIONAL: Resting in bed in no distress. EYES: Pupils are equal, round, and reactive to light, Sclera are non-icteric. EARS, NOSE, MOUTH AND THROAT: The oropharynx is clear. The oral mucosa is pink and moist. Hearing is intact to voice. LYMPH NODES:  Lymph nodes in the neck are normal. RESPIRATORY:  Lungs are coarse throughout. There is normal respiratory effort, with equal breath sounds bilaterally, and without pathologic use of accessory muscles. CARDIOVASCULAR: Heart is tachycardic GI: The abdomen is soft, tender to palpation in all quadrants worse in the right upper quadrant with a positive Murphy sign but no obvious peritonitis, and nondistended. There is a large obvious hernia at the site of his prior colostomy. Multiple well-healed prior incisions in the midline and lower abdomen. There are normal bowel sounds in all quadrants. GU: Rectal deferred.   MUSCULOSKELETAL: Normal muscle strength and tone. No cyanosis or edema.   SKIN: Turgor is good and there are no pathologic skin lesions or ulcers. NEUROLOGIC: Motor and sensation is grossly normal. Cranial nerves are grossly intact. PSYCH:  Oriented to person, place and time. Affect is normal.  Data Reviewed I reviewed the patient's images and labs. CT scan  does show a very large, edematous gallbladder with fluid tracking along the right paracolic gutter. Multiple areas of bowel missing the staple lines from prior colon cancer. No evidence of free air within the abdominal cavity. There is a calcified lesion in the base of the right long along the diaphragm. Labs are mostly concerning for leukocytosis of 23.1. Also has a lactic acidosis of 8.9 and elevated transaminases and bilirubin. I have personally reviewed the patient's imaging, laboratory findings and medical records.    Assessment     75 year old male with acute cholecystitis.    Plan    47 male with a very inflamed acute cholecystitis. Discussed treatment options with the patient to include antibiotics, per venous strain, possible surgery. Patient states he wants to be over with this process as was possible and elects for surgery.  I discussed the procedure in detail. We discussed the risks and benefits of a laparoscopic cholecystectomy and possible cholangiogram including, but not limited to bleeding, infection, injury to surrounding structures such as the intestine or liver, bile leak, retained gallstones, need to convert to an open procedure, prolonged diarrhea, blood clots such as  DVT, common bile duct injury, anesthesia risks, and possible need for additional procedures.  The likelihood of improvement in symptoms and return to the patient's normal status is good. We discussed the typical post-operative recovery course. Plan for admission to the hospital from the emergency department currently. We'll continue with IV fluid rehydration. Proceed to the operating room later this afternoon. He will likely have a prolonged stay postoperatively given his advanced age      Time spent with the patient was 55 minutes, with more than 50% of the time spent in face-to-face education, counseling and care coordination.     Clayburn Pert, MD FACS General Surgeon 10/05/2015, 11:58 AM

## 2015-10-05 NOTE — ED Provider Notes (Signed)
[facility] Emergency Department Provider Note  ____________________________________________  Time seen: Approximately 8:30 AM  I have reviewed the triage vital signs and the nursing notes.   HISTORY  Chief Complaint Abdominal Pain    HPI Preston Bell is a 75 y.o. male history of hypertension, coronary artery disease, stable lung mass, history of colon cancer status post colostomy with reversal who presents for evaluation of less than 24 hours recurrent nonbloody nonbilious emesis as well as diffuse abdominal pain, gradual onset, constant since onset, currently severe. He also had one episode of diarrhea this morning. He denies fevers. He has had chills. No chest pain or difficulty breathing. No dysuria.   Past Medical History  Diagnosis Date  . Cancer ([place])   . History of colectomy   . Hypertension   . Irregular heart rhythm   . GERD (gastroesophageal reflux disease)     Patient Active Problem List   Diagnosis Date Noted  . Cholecystitis 10/05/2015  . Sepsis ([place]) 10/05/2015  . Generalized abdominal pain     Past Surgical History  Procedure Laterality Date  . Colostomy reversal      Current Outpatient Rx  Name  Route  Sig  Dispense  Refill  . aspirin EC 81 MG tablet   Oral   Take 81 mg by mouth daily.         Marland Kitchen atorvastatin (LIPITOR) 20 MG tablet   Oral   Take 40 mg by mouth daily.         Marland Kitchen lisinopril (PRINIVIL,ZESTRIL) 40 MG tablet   Oral   Take 40 mg by mouth daily.         . metoprolol succinate (TOPROL XL) 50 MG 24 hr tablet   Oral   Take 50 mg by mouth daily.         . mirtazapine (REMERON) 15 MG tablet   Oral   Take 15 mg by mouth at bedtime.         Marland Kitchen omeprazole (PRILOSEC) 20 MG capsule   Oral   Take 20 mg by mouth daily.           Allergies Review of patient's allergies indicates no known allergies.  No family history on file.  Social History Social History  Substance Use Topics  . Smoking  status: Former Research scientist (life sciences)  . Smokeless tobacco: Never Used  . Alcohol Use: No    Review of Systems Constitutional: No fever; +chills Eyes: No visual changes. ENT: No sore throat. Cardiovascular: Denies chest pain. Respiratory: Denies shortness of breath. Gastrointestinal: + abdominal pain.  + nausea, + vomiting.  + diarrhea.  No constipation. Genitourinary: Negative for dysuria. Musculoskeletal: Negative for back pain. Skin: Negative for rash. Neurological: Negative for headaches, focal weakness or numbness.  10-point ROS otherwise negative.  ____________________________________________   PHYSICAL EXAM:  VITAL SIGNS: ED Triage Vitals  Enc Vitals Group     BP 10/05/15 0822 180/99 mmHg     Pulse Rate 10/05/15 0821 157     Resp 10/05/15 0821 24     Temp 10/05/15 0821 97.6 F (36.4 C)     Temp src --      SpO2 --      Weight 10/05/15 0821 170 lb (77.111 kg)     Height 10/05/15 0821 6\' 2"  (1.88 m)     Head Cir --      Peak Flow --      Pain Score 10/05/15 0822 10     Pain Loc --  Pain Edu? --      Excl. in [place]? --     Constitutional: Alert and oriented. Ill-appearing, tremulous. Eyes: Conjunctivae are normal. PERRL. EOMI. Head: Atraumatic. Nose: No congestion/rhinnorhea. Mouth/Throat: Mucous membranes are moist.  Oropharynx non-erythematous. Neck: No stridor.  Cardiovascular: Tachycardic rate, regular rhythm. Grossly normal heart sounds.  Good peripheral circulation. Respiratory: + tachypneic. Normal respiratory effort.  No retractions. Lungs CTAB. Gastrointestinal: Soft with diffuse tenderness. No CVA tenderness. Genitourinary: deferred Musculoskeletal: No lower extremity tenderness nor edema.  No joint effusions. Neurologic:  Normal speech and language. No gross focal neurologic deficits are appreciated. Skin:  Skin is warm, dry and intact. No rash noted. Psychiatric: Mood and affect are normal. Speech and behavior are  normal.  ____________________________________________   LABS (all labs ordered are listed, but only abnormal results are displayed)  Labs Reviewed  CBC WITH DIFFERENTIAL/PLATELET - Abnormal; Notable for the following:    WBC 23.1 (*)    RBC 4.07 (*)    HCT 39.3 (*)    Neutro Abs 21.6 (*)    Lymphs Abs 0.8 (*)    All other components within normal limits  COMPREHENSIVE METABOLIC PANEL - Abnormal; Notable for the following:    CO2 16 (*)    Glucose, Bld 216 (*)    BUN 21 (*)    Creatinine, Ser 1.51 (*)    Total Protein 8.4 (*)    AST 190 (*)    ALT 173 (*)    Total Bilirubin 1.9 (*)    GFR calc non Af Amer 44 (*)    GFR calc Af Amer 51 (*)    Anion gap 21 (*)    All other components within normal limits  LIPASE, BLOOD - Abnormal; Notable for the following:    Lipase 366 (*)    All other components within normal limits  TROPONIN I - Abnormal; Notable for the following:    Troponin I 0.04 (*)    All other components within normal limits  PROTIME-INR - Abnormal; Notable for the following:    Prothrombin Time 15.2 (*)    All other components within normal limits  URINALYSIS COMPLETEWITH MICROSCOPIC ([facility] ONLY) - Abnormal; Notable for the following:    Color, Urine STRAW (*)    APPearance CLEAR (*)    Hgb urine dipstick 1+ (*)    Squamous Epithelial / LPF 0-5 (*)    All other components within normal limits  LACTIC ACID, PLASMA - Abnormal; Notable for the following:    Lactic Acid, Venous 8.9 (*)    All other components within normal limits  LACTIC ACID, PLASMA - Abnormal; Notable for the following:    Lactic Acid, Venous 3.2 (*)    All other components within normal limits  CULTURE, BLOOD (ROUTINE X 2)  CULTURE, BLOOD (ROUTINE X 2)  APTT   ____________________________________________  EKG  ED ECG REPORT I, Joanne Gavel, the attending physician, personally viewed and interpreted this ECG.   Date: 10/05/2015  EKG Time: 08:40  Rate: 152  Rhythm: sinus  tachycardia  Axis: normal  Intervals:none  ST&T Change: No acute ST elevation. ST depression in V2, V3, V4, V5, V6.  ____________________________________________  RADIOLOGY  CXR  CT abdomen and pelvis ____________________________________________   PROCEDURES  Procedure(s) performed: None  Critical Care performed: Yes, see critical care note(s). Total critical care time spent 35 minutes.  ____________________________________________   INITIAL IMPRESSION / ASSESSMENT AND PLAN / ED COURSE  Pertinent labs & imaging results that were available  during my care of the patient were reviewed by me and considered in my medical decision making (see chart for details).  Preston Bell is a 75 y.o. male history of hypertension, coronary artery disease, stable lung mass, history of colon cancer status post colostomy with reversal who presents for evaluation of less than 24 hours recurrent nonbloody nonbilious emesis as well as diffuse abdominal pain. On arrival to the emergency department he is ill-appearing, tremulous, tachypnea, heart rate 150s. EKG with question of sinus tachycardia however clinically his rate behaves more like atrial flutter so will give Cardizem. Additionally, meeting multiple Sirs criteria, we'll give IV fluids as well as Zosyn due to concern for possible intra-abdominal infection. We'll treat his pain. Screening labs, urinalysis, CT abdomen and pelvis and chest x-ray pending.   ----------------------------------------- 9:37 AM on 10/05/2015 -----------------------------------------  Patient with heart rate in the 160s despite diltiazem. Attempted to give adenosine to slow rate so that we can determine the underlying rhythm however due to problems with the EKG at bedside, this was not captured. The patient's rate has improved to 137 bpm at this time and I suspect sinus tachycardia.  ----------------------------------------- 11:24 AM on  10/05/2015 -----------------------------------------  Heart rate improved to 125 bpm at this time. CT scan concerning for acute cholecystitis. I discussed the case with Dr. Adonis Huguenin of Gen. surgery who will evaluate the patient and admit. ____________________________________________   FINAL CLINICAL IMPRESSION(S) / ED DIAGNOSES  Final diagnoses:  Generalized abdominal pain  Vomiting and diarrhea  Cholecystitis      Joanne Gavel, MD 10/05/15 1544

## 2015-10-05 NOTE — ED Notes (Signed)
Pt transported to specials with medic and rn.

## 2015-10-05 NOTE — ED Notes (Addendum)
Pt reports vomiting since last night; hx of colon cancer and colostomy, pt reports vomiting and diarrhea since last night. Pt reports generalized abdominal pain.

## 2015-10-05 NOTE — H&P (Signed)
Chief Complaint: Cholecystitis.  Referring Physician(s): Dr. Clayburn Pert  History of Present Illness: Preston Bell is a 75 y.o. male presenting with abdominal pain, sepsis and CT evidence of cholelithiasis and acute cholecystitis.  Not a candidate for cholecystectomy per Dr. Adonis Huguenin due to high current surgical risk.  Consulted for percutaneous cholecystostomy tube placement.  Past Medical History  Diagnosis Date  . Cancer ([place])   . History of colectomy   . Hypertension   . Irregular heart rhythm   . GERD (gastroesophageal reflux disease)     Past Surgical History  Procedure Laterality Date  . Colostomy reversal      Allergies: Review of patient's allergies indicates no known allergies.  Medications: Prior to Admission medications   Medication Sig Start Date End Date Taking? Authorizing Provider  aspirin EC 81 MG tablet Take 81 mg by mouth daily. 06/14/15 02/20/29 Yes Historical Provider, MD  atorvastatin (LIPITOR) 20 MG tablet Take 40 mg by mouth daily. 06/14/15 06/13/16 Yes Historical Provider, MD  lisinopril (PRINIVIL,ZESTRIL) 40 MG tablet Take 40 mg by mouth daily. 06/14/15 06/13/16 Yes Historical Provider, MD  metoprolol succinate (TOPROL XL) 50 MG 24 hr tablet Take 50 mg by mouth daily. 06/14/15 06/13/16 Yes Historical Provider, MD  mirtazapine (REMERON) 15 MG tablet Take 15 mg by mouth at bedtime. 06/14/15 06/13/16 Yes Historical Provider, MD  omeprazole (PRILOSEC) 20 MG capsule Take 20 mg by mouth daily. 06/14/15  Yes Historical Provider, MD     No family history on file.  Social History   Social History  . Marital Status: Legally Separated    Spouse Name: N/A  . Number of Children: N/A  . Years of Education: N/A   Social History Main Topics  . Smoking status: Former Research scientist (life sciences)  . Smokeless tobacco: Never Used  . Alcohol Use: No  . Drug Use: No  . Sexual Activity: Not Asked   Other Topics Concern  . None   Social History Narrative     Review of Systems: A 12 point ROS discussed and pertinent positives are indicated in the HPI above.  All other systems are negative.  Review of Systems  Vital Signs: BP 121/66 mmHg  Pulse 122  Temp(Src) 98.3 F (36.8 C) (Axillary)  Resp 20  Ht 6\' 2"  (1.88 m)  Wt 180 lb (81.647 kg)  BMI 23.10 kg/m2  SpO2 93%  Physical Exam  Mallampati Score:  MD Evaluation Airway: WNL Heart: WNL Abdomen: Other (comments) Abdomen comments: Tender to palpation Chest/ Lungs: WNL ASA  Classification: 3 Mallampati/Airway Score: One  Imaging: Dg Abd 1 View  10/05/2015  CLINICAL DATA:  Abdominal pain.  Colon cancer.  Possible sepsis. EXAM: ABDOMEN - 1 VIEW COMPARISON:  Chest radiograph of same date. FINDINGS: Portable AP upright view of the abdomen pelvis. No free intraperitoneal air. A right abdominal air-fluid level could be within the proximal small bowel or distal stomach. Mild paucity of bowel gas otherwise. Exclusion of the pelvis. No abnormal abdominal calcifications. No appendicolith. Convex left lumbar spine curvature with advanced spondylosis. IMPRESSION: Nonspecific bowel gas pattern. Relative paucity of bowel gas with an isolated right abdominal fluid level. No free intraperitoneal air identified Electronically Signed   By: Abigail Miyamoto M.D.   On: 10/05/2015 09:45   Ct Abdomen Pelvis W Contrast  10/05/2015  CLINICAL DATA:  Abdominal pain since 8 o'clock last night. Vomiting and diarrhea. EXAM: CT ABDOMEN AND PELVIS WITH CONTRAST TECHNIQUE: Multidetector CT imaging of the abdomen and pelvis was  performed using the standard protocol following bolus administration of intravenous contrast. CONTRAST:  12mL OMNIPAQUE IOHEXOL 300 MG/ML  SOLN COMPARISON:  None. FINDINGS: Lower chest: Mild bibasilar atelectasis. The mild right basilar calcified pleural plaque with a 4.2 x 1.5 cm nodular area adjacently which may reflect focal pleural thickening versus fibrosis. Normal heart size. Mild coronary artery  atherosclerotic cyst in the LAD. Hepatobiliary: Normal liver. Cholelithiasis with gallbladder wall thickening with pericholecystic fluid and surrounding inflammatory changes . Pancreas: Normal. Spleen: Normal. Adrenals/Urinary Tract: Normal adrenal glands. Normal kidneys. Normal bladder. Stomach/Bowel: No bowel wall thickening or dilatation. No pneumatosis, pneumoperitoneum or portal venous gas. Small amount of abdominal free fluid extending into the right pericolic gutter. Vascular/Lymphatic: Normal caliber abdominal aorta with atherosclerosis. No lymphadenopathy. Other: No fluid collection or hematoma. Musculoskeletal: No lytic or sclerotic osseous lesion. No aggressive osseous abnormality. 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 IMPRESSION: 1. Cholelithiasis with gallbladder wall thickening with pericholecystic fluid and surrounding inflammatory changes most concerning for acute cholecystitis. 2. Mild right basilar calcified pleural plaque with a 4.2 x 1.5 cm nodular area adjacently which may reflect focal pleural thickening versus fibrosis. Recommend follow-up CT chest in 3-6 months. Electronically Signed   By: Kathreen Devoid   On: 10/05/2015 11:11   Dg Chest Portable 1 View  10/05/2015  CLINICAL DATA:  Vomiting. EXAM: PORTABLE CHEST 1 VIEW COMPARISON:  None. FINDINGS: The heart size and mediastinal contours are within normal limits. Both lungs are clear. No pneumothorax or pleural effusion is noted. The visualized skeletal structures are unremarkable. IMPRESSION: No acute cardiopulmonary abnormality seen. Electronically Signed   By: Marijo Conception, M.D.   On: 10/05/2015 09:44    Labs:  CBC:  Recent Labs  10/05/15 0839  WBC 23.1*  HGB 13.2  HCT 39.3*  PLT 238    COAGS:  Recent Labs  10/05/15 0839  INR 1.18  APTT 31    BMP:  Recent Labs  10/05/15 0839  NA 141  K 3.6  CL 104  CO2 16*  GLUCOSE 216*  BUN 21*    CALCIUM 9.8  CREATININE 1.51*  GFRNONAA 44*  GFRAA 51*    LIVER FUNCTION TESTS:  Recent Labs  10/05/15 0839  BILITOT 1.9*  AST 190*  ALT 173*  ALKPHOS 125  PROT 8.4*  ALBUMIN 4.8     Assessment and Plan:  CT reviewed.  Discussed procedure with Preston Bell and his son.  Percutaneous cholecystostomy indicated due to high current surgical risk.  Will proceed today with tube placement under IV conscious sedation.  Risks and benefits discussed with the patient including, but not limited to bleeding, infection, gallbladder perforation, bile leak, sepsis or even death.  All of the patient's questions were answered, patient is agreeable to proceed.  Consent signed and in chart.  Electronically SignedAletta Edouard T 10/05/2015, 3:17 PM

## 2015-10-05 NOTE — Progress Notes (Signed)
Pt w Acute cholecystitis s/p cholecystostomy tube SVT earlier this am, adenosine and cardizem given. Rounding this evening I discovered a bumped in his trop to 7.7. Ordered repeat EKG , follow another set of troponin Cards consult requested ( further w/u including echo, control HR , ? Heparin) D/w hospitalist in detail give 500 cc LR since I think he is still intravascular depleted Will likely need to delay any surgical intervention until medically optimize from CV perspective ( ideally more than 6 months if in fact he is having an MI) From the surgical perspective his abdomen is soft. Mildly tender and there is improvement after drain has been placed

## 2015-10-05 NOTE — Progress Notes (Signed)
[place] [facility] [place] NAME: Preston Bell    MR#:  [id number]  DATE OF BIRTH:  1941/07/23  SUBJECTIVE:  CHIEF COMPLAINT:   Chief Complaint  Patient presents with  . Abdominal Pain   Patient admitted for acute cholecystitis. Had SVT earlier and was given IV Cardizem and adenosine which resolved SVT. He was considered high risk for surgery and interventional radiology placed a percutaneous cholecystostomy tube. Follow-up troponin has returned elevated at 7. Patient does not complain of any chest pain or shortness of breath. He does have a history of SVT. EKG shows no significant ST changes.  REVIEW OF SYSTEMS:    Review of Systems  Constitutional: Positive for chills and malaise/fatigue. Negative for fever.  HENT: Negative for sore throat.   Respiratory: Negative for cough, hemoptysis, shortness of breath and wheezing.   Cardiovascular: Positive for palpitations. Negative for chest pain and leg swelling.  Gastrointestinal: Negative for heartburn, nausea, vomiting, abdominal pain, diarrhea and constipation.  Genitourinary: Negative for dysuria and hematuria.  Musculoskeletal: Negative for back pain and joint pain.  Skin: Negative for rash.  Neurological: Positive for weakness. Negative for sensory change, speech change and focal weakness.  Endo/Heme/Allergies: Does not bruise/bleed easily.  Psychiatric/Behavioral: Negative for depression. The patient is not nervous/anxious.       DRUG ALLERGIES:  No Known Allergies  VITALS:  Blood pressure 112/60, pulse 114, temperature 98 F (36.7 C), temperature source Axillary, resp. rate 28, height 6\' 2"  (1.88 m), weight 78 kg (171 lb 15.3 oz), SpO2 94 %.  PHYSICAL EXAMINATION:   Physical Exam  GENERAL:  75 y.o.-year-old patient lying in the bed with no acute distress.  EYES:  No scleral icterus. Extraocular muscles intact.  HEENT: Head atraumatic, normocephalic. Oropharynx and  nasopharynx clear.  NECK:  Supple, no jugular venous distention. No thyroid enlargement, no tenderness.  LUNGS: Normal breath sounds bilaterally, no wheezing, rales, rhonchi. No use of accessory muscles of respiration.  CARDIOVASCULAR: S1, S2 normal. No murmurs, rubs, or gallops.  ABDOMEN: Tenderness positive percutaneous cholecystostomy drain in place.  EXTREMITIES: No cyanosis, clubbing or edema b/l.    PSYCHIATRIC: The patient is alert and oriented x 3.  SKIN: No obvious rash, lesion, or ulcer.    LABORATORY PANEL:   CBC  Recent Labs Lab 10/05/15 0839  WBC 23.1*  HGB 13.2  HCT 39.3*  PLT 238   ------------------------------------------------------------------------------------------------------------------  Chemistries   Recent Labs Lab 10/05/15 0839 10/05/15 2141  NA 141 136  K 3.6 4.1  CL 104 105  CO2 16* 24  GLUCOSE 216* 151*  BUN 21* 19  CREATININE 1.51* 1.07  CALCIUM 9.8 7.9*  MG  --  1.1*  AST 190*  --   ALT 173*  --   ALKPHOS 125  --   BILITOT 1.9*  --    ------------------------------------------------------------------------------------------------------------------  Cardiac Enzymes  Recent Labs Lab 10/05/15 2141  TROPONINI 13.17*   ------------------------------------------------------------------------------------------------------------------  RADIOLOGY:  Dg Abd 1 View  10/05/2015  CLINICAL DATA:  Abdominal pain.  Colon cancer.  Possible sepsis. EXAM: ABDOMEN - 1 VIEW COMPARISON:  Chest radiograph of same date. FINDINGS: Portable AP upright view of the abdomen pelvis. No free intraperitoneal air. A right abdominal air-fluid level could be within the proximal small bowel or distal stomach. Mild paucity of bowel gas otherwise. Exclusion of the pelvis. No abnormal abdominal calcifications. No appendicolith. Convex left lumbar spine curvature with advanced spondylosis. IMPRESSION: Nonspecific bowel gas pattern. Relative paucity  of bowel gas with an  isolated right abdominal fluid level. No free intraperitoneal air identified Electronically Signed   By: Abigail Miyamoto M.D.   On: 10/05/2015 09:45   [place] Intraoperative  10/05/2015  CLINICAL DATA:  Ultrasound was provided for use by the ordering physician, and a technical charge was applied by the performing facility.  No radiologist interpretation/professional services rendered.   Ct Abdomen Pelvis W Contrast  10/05/2015  CLINICAL DATA:  Abdominal pain since 8 o'clock last night. Vomiting and diarrhea. EXAM: CT ABDOMEN AND PELVIS WITH CONTRAST TECHNIQUE: Multidetector CT imaging of the abdomen and pelvis was performed using the standard protocol following bolus administration of intravenous contrast. CONTRAST:  66mL OMNIPAQUE IOHEXOL 300 MG/ML  SOLN COMPARISON:  None. FINDINGS: Lower chest: Mild bibasilar atelectasis. The mild right basilar calcified pleural plaque with a 4.2 x 1.5 cm nodular area adjacently which may reflect focal pleural thickening versus fibrosis. Normal heart size. Mild coronary artery atherosclerotic cyst in the LAD. Hepatobiliary: Normal liver. Cholelithiasis with gallbladder wall thickening with pericholecystic fluid and surrounding inflammatory changes . Pancreas: Normal. Spleen: Normal. Adrenals/Urinary Tract: Normal adrenal glands. Normal kidneys. Normal bladder. Stomach/Bowel: No bowel wall thickening or dilatation. No pneumatosis, pneumoperitoneum or portal venous gas. Small amount of abdominal free fluid extending into the right pericolic gutter. Vascular/Lymphatic: Normal caliber abdominal aorta with atherosclerosis. No lymphadenopathy. Other: No fluid collection or hematoma. Musculoskeletal: No lytic or sclerotic osseous lesion. No aggressive osseous abnormality. 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 IMPRESSION: 1. Cholelithiasis with gallbladder wall thickening with pericholecystic fluid and surrounding inflammatory  changes most concerning for acute cholecystitis. 2. Mild right basilar calcified pleural plaque with a 4.2 x 1.5 cm nodular area adjacently which may reflect focal pleural thickening versus fibrosis. Recommend follow-up CT chest in 3-6 months. Electronically Signed   By: Kathreen Devoid   On: 10/05/2015 11:11   Ir Perc Cholecystostomy  10/05/2015  CLINICAL DATA:  Acute calculus cholecystitis. The patient is at high risk for immediate cholecystectomy and requires percutaneous cholecystostomy tube placement. EXAM: PERCUTANEOUS CHOLECYSTOSTOMY COMPARISON:  CT of the abdomen earlier today ANESTHESIA/SEDATION: 3.5 mg IV Versed; 125 mcg IV Fentanyl. Total Moderate Sedation Time 15 minutes. The patient's level of consciousness and physiologic status were continuously monitored during the procedure by Radiology nursing. CONTRAST:  66mL OMNIPAQUE IOHEXOL 300 MG/ML  SOLN MEDICATIONS: No additional medications. FLUOROSCOPY TIME:  30 seconds. PROCEDURE: The procedure, risks, benefits, and alternatives were explained to the patient. Questions regarding the procedure were encouraged and answered. The patient understands and consents to the procedure. A time-out was performed prior to the procedure. Ultrasound was used to localize the gallbladder. The right abdominal wall was prepped with chlorhexidine in a sterile fashion, and a sterile drape was applied covering the operative field. A sterile gown and sterile gloves were used for the procedure. Local anesthesia was provided with 1% Lidocaine. Ultrasound image documentation was performed. Fluoroscopy during the procedure and fluoro spot radiograph confirms appropriate catheter position. Under direct ultrasound guidance, a 21 gauge needle was advanced into the gallbladder lumen. Aspiration was performed and a bile sample sent for culture studies. A small amount of diluted contrast material was injected. A guide wire was then advanced into the gallbladder. A transitional dilator was  placed. Percutaneous tract dilatation was then performed over a guide wire to 10-French. A 10-French pigtail drainage catheter was then advanced into the gallbladder lumen under fluoroscopy. Catheter was formed and injected  with contrast material to confirm position. The catheter was flushed and connected to a gravity drainage bag. It was secured at the skin with a Prolene retention suture and Stat-Lock device. COMPLICATIONS: None FINDINGS: A cholecystostomy tube was advanced into the gallbladder lumen and formed. It is now draining bile. This tube will be left to gravity drainage. IMPRESSION: Percutaneous cholecystostomy with placement of 10-French drainage catheter into the gallbladder lumen. This was left to gravity drainage. Electronically Signed   By: Aletta Edouard M.D.   On: 10/05/2015 16:33   Dg Chest Portable 1 View  10/05/2015  CLINICAL DATA:  Vomiting. EXAM: PORTABLE CHEST 1 VIEW COMPARISON:  None. FINDINGS: The heart size and mediastinal contours are within normal limits. Both lungs are clear. No pneumothorax or pleural effusion is noted. The visualized skeletal structures are unremarkable. IMPRESSION: No acute cardiopulmonary abnormality seen. Electronically Signed   By: Marijo Conception, M.D.   On: 10/05/2015 09:44     ASSESSMENT AND PLAN:   * Elevated troponin Possible elevation due to SVT with infection/ demand ischemia. Aspirin and beta blocker given. Discussed case with cardiology Dr. Georga Hacking. We'll start heparin drip. Check echocardiogram Discussed with patient regarding plan. Discussed with surgery Dr. Dahlia Byes and can start heparin from surgical standpoint. No plans for surgery at this time. Normal platelets and INR. No bleeding.  * Acute cholecystitis Status post percutaneous cholecystostomy by interventional radiology. Drain in place. On IV antibiotics  * SVT Patient received IV Cardizem and adenosine earlier. Now on metoprolol daily He does have history of SVT.  I  will also taken over as the attending of the patient from surgical service.  Management plans discussed with the patient and is in agreement.   DVT Prophylaxis: heparin drip  TOTAL CC TIME TAKING CARE OF THIS PATIENT: 35 minutes.    [name] M.D on 10/05/2015 at 10:50 PM  Between 7am to 6pm - Pager - [phone]  After 6pm go to [email or web address] - password [email or web address] [facility]  Office  [phone]  CC: Primary care physician; Pcp Not In System    Note: This dictation was prepared with Dragon dictation along with smaller phrase technology. Any transcriptional errors that result from this process are unintentional.

## 2015-10-06 ENCOUNTER — Inpatient Hospital Stay (HOSPITAL_COMMUNITY)
Admit: 2015-10-06 | Discharge: 2015-10-06 | Disposition: A | Discharge status: Home / Self Care | Payer: Medicare Other | Attending: Internal Medicine | Admitting: Internal Medicine

## 2015-10-06 ENCOUNTER — Encounter: Payer: Self-pay | Admitting: Student

## 2015-10-06 DIAGNOSIS — I340 Nonrheumatic mitral (valve) insufficiency: Secondary | ICD-10-CM

## 2015-10-06 DIAGNOSIS — I214 Non-ST elevation (NSTEMI) myocardial infarction: Secondary | ICD-10-CM | POA: Insufficient documentation

## 2015-10-06 DIAGNOSIS — R7989 Other specified abnormal findings of blood chemistry: Secondary | ICD-10-CM

## 2015-10-06 DIAGNOSIS — R778 Other specified abnormalities of plasma proteins: Secondary | ICD-10-CM

## 2015-10-06 LAB — HEPARIN LEVEL (UNFRACTIONATED)
HEPARIN UNFRACTIONATED: 0.27 [IU]/mL — AB (ref 0.30–0.70)
Heparin Unfractionated: 0.22 IU/mL — ABNORMAL LOW (ref 0.30–0.70)

## 2015-10-06 LAB — COMPREHENSIVE METABOLIC PANEL
ALBUMIN: 3.2 g/dL — AB (ref 3.5–5.0)
ALK PHOS: 58 U/L (ref 38–126)
ALT: 103 U/L — AB (ref 17–63)
ANION GAP: 6 (ref 5–15)
AST: 174 U/L — ABNORMAL HIGH (ref 15–41)
BUN: 18 mg/dL (ref 6–20)
CALCIUM: 8.1 mg/dL — AB (ref 8.9–10.3)
CHLORIDE: 103 mmol/L (ref 101–111)
CO2: 27 mmol/L (ref 22–32)
CREATININE: 0.99 mg/dL (ref 0.61–1.24)
GFR calc Af Amer: >60 mL/min (ref >60)
GFR calc non Af Amer: >60 mL/min (ref >60)
Glucose, Bld: 125 mg/dL — ABNORMAL HIGH (ref 65–99)
Potassium: 3.8 mmol/L (ref 3.5–5.1)
SODIUM: 136 mmol/L (ref 135–145)
Total Bilirubin: 2.2 mg/dL — ABNORMAL HIGH (ref 0.3–1.2)
Total Protein: 5.9 g/dL — ABNORMAL LOW (ref 6.5–8.1)

## 2015-10-06 LAB — CBC
HCT: 31.4 % — ABNORMAL LOW (ref 40.0–52.0)
Hemoglobin: 10.8 g/dL — ABNORMAL LOW (ref 13.0–18.0)
MCH: 32.3 pg (ref 26.0–34.0)
MCHC: 34.3 g/dL (ref 32.0–36.0)
MCV: 94.2 fL (ref 80.0–100.0)
Platelets: 115 10*3/uL — ABNORMAL LOW (ref 150–440)
RBC: 3.34 MIL/uL — ABNORMAL LOW (ref 4.40–5.90)
RDW: 14.1 % (ref 11.5–14.5)
WBC: 11.3 10*3/uL — AB (ref 3.8–10.6)

## 2015-10-06 LAB — MAGNESIUM: MAGNESIUM: 2.4 mg/dL (ref 1.7–2.4)

## 2015-10-06 LAB — PHOSPHORUS: PHOSPHORUS: 2.7 mg/dL (ref 2.5–4.6)

## 2015-10-06 LAB — TROPONIN I
TROPONIN I: 11.27 ng/mL — AB (ref <0.031)
Troponin I: 19.47 ng/mL — ABNORMAL HIGH (ref <0.031)
Troponin I: 13.95 ng/mL — ABNORMAL HIGH (ref <0.031)

## 2015-10-06 MED ORDER — CEFTRIAXONE SODIUM 2 G IJ SOLR
2.0000 g | INTRAMUSCULAR | Status: DC
  Administered 2015-10-06 – 2015-10-08 (×3): 2 g via INTRAVENOUS
  Filled 2015-10-06 (×5): qty 2

## 2015-10-06 MED ORDER — METOPROLOL SUCCINATE ER 100 MG PO TB24
100.0000 mg | ORAL_TABLET | Freq: Every day | ORAL | Status: DC
  Administered 2015-10-07 – 2015-10-08 (×2): 100 mg via ORAL
  Filled 2015-10-06 (×2): qty 1

## 2015-10-06 MED ORDER — HEPARIN BOLUS VIA INFUSION
1200.0000 [IU] | Freq: Once | INTRAVENOUS | Status: AC
  Administered 2015-10-06: 1200 [IU] via INTRAVENOUS
  Filled 2015-10-06: qty 1200

## 2015-10-06 MED ORDER — LISINOPRIL 20 MG PO TABS
20.0000 mg | ORAL_TABLET | Freq: Every day | ORAL | Status: DC
  Administered 2015-10-07 – 2015-10-08 (×2): 20 mg via ORAL
  Filled 2015-10-06 (×2): qty 1

## 2015-10-06 NOTE — Progress Notes (Signed)
[facility] at [place] NAME: Preston Bell    MR#:  [id number]  DATE OF BIRTH:  September 24, 1940  SUBJECTIVE:   Patient noted to have elevated troponin consistent with a non-ST elevation MI. He denies any chest pain. Heart rate stable. Afebrile, hemodynamically stable.  REVIEW OF SYSTEMS:    Review of Systems  Constitutional: Negative for fever and chills.  HENT: Negative for congestion and tinnitus.   Eyes: Negative for blurred vision and double vision.  Respiratory: Negative for cough, shortness of breath and wheezing.   Cardiovascular: Negative for chest pain, orthopnea and PND.  Gastrointestinal: Positive for abdominal pain (right upper quadrant). Negative for nausea, vomiting and diarrhea.  Genitourinary: Negative for dysuria and hematuria.  Neurological: Negative for dizziness, sensory change and focal weakness.  All other systems reviewed and are negative.   Nutrition: Clear liquid Tolerating Diet: Yes Tolerating PT: Await evaluation   DRUG ALLERGIES:  No Known Allergies  VITALS:  Blood pressure 134/51, pulse 110, temperature 99.3 F (37.4 C), temperature source Oral, resp. rate 28, height 6\' 2"  (1.88 m), weight 78 kg (171 lb 15.3 oz), SpO2 94 %.  PHYSICAL EXAMINATION:   Physical Exam  GENERAL: 75 y.o.-year-old patient lying in the bed with no acute distress.  EYES: Pupils equal, round, reactive to light and accommodation. No scleral icterus. Extraocular muscles intact.  HEENT: Head atraumatic, normocephalic. Oropharynx and nasopharynx clear.  NECK: Supple, no jugular venous distention. No thyroid enlargement, no tenderness.  LUNGS: Normal breath sounds bilaterally, no wheezing, rales, rhonchi . No use of accessory muscles of respiration.  CARDIOVASCULAR: S1, S2, RRR. No murmurs, rubs, gallops, clicks.  ABDOMEN: Soft, Tender in the RUQ, no rebound, rigidity, nondistended. Bowel sounds present. No organomegaly or  mass. + abdominal wall hernia from previous colon cancer surgery which is reducible  + right sided percutaneous cholecystostomy tube with bilious drainage EXTREMITIES: No pedal edema, cyanosis, or clubbing.  NEUROLOGIC: Cranial nerves II through XII are intact. No focal motor or sensory deficits appreciated bilaterally  PSYCHIATRIC: The patient is alert and oriented x 3. Good affect SKIN: No obvious rash, lesion, or ulcer.    LABORATORY PANEL:   CBC  Recent Labs Lab 10/06/15 0303  WBC 11.3*  HGB 10.8*  HCT 31.4*  PLT 115*   ------------------------------------------------------------------------------------------------------------------  Chemistries   Recent Labs Lab 10/06/15 0303  NA 136  K 3.8  CL 103  CO2 27  GLUCOSE 125*  BUN 18  CREATININE 0.99  CALCIUM 8.1*  MG 2.4  AST 174*  ALT 103*  ALKPHOS 58  BILITOT 2.2*   ------------------------------------------------------------------------------------------------------------------  Cardiac Enzymes  Recent Labs Lab 10/06/15 1119  TROPONINI 13.95*   ------------------------------------------------------------------------------------------------------------------  RADIOLOGY:  Dg Abd 1 View  10/05/2015  CLINICAL DATA:  Abdominal pain.  Colon cancer.  Possible sepsis. EXAM: ABDOMEN - 1 VIEW COMPARISON:  Chest radiograph of same date. FINDINGS: Portable AP upright view of the abdomen pelvis. No free intraperitoneal air. A right abdominal air-fluid level could be within the proximal small bowel or distal stomach. Mild paucity of bowel gas otherwise. Exclusion of the pelvis. No abnormal abdominal calcifications. No appendicolith. Convex left lumbar spine curvature with advanced spondylosis. IMPRESSION: Nonspecific bowel gas pattern. Relative paucity of bowel gas with an isolated right abdominal fluid level. No free intraperitoneal air identified Electronically Signed   By: Abigail Miyamoto M.D.   On: 10/05/2015 09:45    [place] Intraoperative  10/05/2015  CLINICAL DATA:  Ultrasound was  provided for use by the ordering physician, and a technical charge was applied by the performing facility.  No radiologist interpretation/professional services rendered.   Ct Abdomen Pelvis W Contrast  10/05/2015  CLINICAL DATA:  Abdominal pain since 8 o'clock last night. Vomiting and diarrhea. EXAM: CT ABDOMEN AND PELVIS WITH CONTRAST TECHNIQUE: Multidetector CT imaging of the abdomen and pelvis was performed using the standard protocol following bolus administration of intravenous contrast. CONTRAST:  23mL OMNIPAQUE IOHEXOL 300 MG/ML  SOLN COMPARISON:  None. FINDINGS: Lower chest: Mild bibasilar atelectasis. The mild right basilar calcified pleural plaque with a 4.2 x 1.5 cm nodular area adjacently which may reflect focal pleural thickening versus fibrosis. Normal heart size. Mild coronary artery atherosclerotic cyst in the LAD. Hepatobiliary: Normal liver. Cholelithiasis with gallbladder wall thickening with pericholecystic fluid and surrounding inflammatory changes . Pancreas: Normal. Spleen: Normal. Adrenals/Urinary Tract: Normal adrenal glands. Normal kidneys. Normal bladder. Stomach/Bowel: No bowel wall thickening or dilatation. No pneumatosis, pneumoperitoneum or portal venous gas. Small amount of abdominal free fluid extending into the right pericolic gutter. Vascular/Lymphatic: Normal caliber abdominal aorta with atherosclerosis. No lymphadenopathy. Other: No fluid collection or hematoma. Musculoskeletal: No lytic or sclerotic osseous lesion. No aggressive osseous abnormality. 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 IMPRESSION: 1. Cholelithiasis with gallbladder wall thickening with pericholecystic fluid and surrounding inflammatory changes most concerning for acute cholecystitis. 2. Mild right basilar calcified pleural plaque with a 4.2 x 1.5 cm nodular area adjacently which  may reflect focal pleural thickening versus fibrosis. Recommend follow-up CT chest in 3-6 months. Electronically Signed   By: Kathreen Devoid   On: 10/05/2015 11:11   Ir Perc Cholecystostomy  10/05/2015  CLINICAL DATA:  Acute calculus cholecystitis. The patient is at high risk for immediate cholecystectomy and requires percutaneous cholecystostomy tube placement. EXAM: PERCUTANEOUS CHOLECYSTOSTOMY COMPARISON:  CT of the abdomen earlier today ANESTHESIA/SEDATION: 3.5 mg IV Versed; 125 mcg IV Fentanyl. Total Moderate Sedation Time 15 minutes. The patient's level of consciousness and physiologic status were continuously monitored during the procedure by Radiology nursing. CONTRAST:  74mL OMNIPAQUE IOHEXOL 300 MG/ML  SOLN MEDICATIONS: No additional medications. FLUOROSCOPY TIME:  30 seconds. PROCEDURE: The procedure, risks, benefits, and alternatives were explained to the patient. Questions regarding the procedure were encouraged and answered. The patient understands and consents to the procedure. A time-out was performed prior to the procedure. Ultrasound was used to localize the gallbladder. The right abdominal wall was prepped with chlorhexidine in a sterile fashion, and a sterile drape was applied covering the operative field. A sterile gown and sterile gloves were used for the procedure. Local anesthesia was provided with 1% Lidocaine. Ultrasound image documentation was performed. Fluoroscopy during the procedure and fluoro spot radiograph confirms appropriate catheter position. Under direct ultrasound guidance, a 21 gauge needle was advanced into the gallbladder lumen. Aspiration was performed and a bile sample sent for culture studies. A small amount of diluted contrast material was injected. A guide wire was then advanced into the gallbladder. A transitional dilator was placed. Percutaneous tract dilatation was then performed over a guide wire to 10-French. A 10-French pigtail drainage catheter was then advanced  into the gallbladder lumen under fluoroscopy. Catheter was formed and injected with contrast material to confirm position. The catheter was flushed and connected to a gravity drainage bag. It was secured at the skin with a Prolene retention suture and Stat-Lock device. COMPLICATIONS: None FINDINGS: A cholecystostomy tube was advanced into the gallbladder  lumen and formed. It is now draining bile. This tube will be left to gravity drainage. IMPRESSION: Percutaneous cholecystostomy with placement of 10-French drainage catheter into the gallbladder lumen. This was left to gravity drainage. Electronically Signed   By: Aletta Edouard M.D.   On: 10/05/2015 16:33   Dg Chest Portable 1 View  10/05/2015  CLINICAL DATA:  Vomiting. EXAM: PORTABLE CHEST 1 VIEW COMPARISON:  None. FINDINGS: The heart size and mediastinal contours are within normal limits. Both lungs are clear. No pneumothorax or pleural effusion is noted. The visualized skeletal structures are unremarkable. IMPRESSION: No acute cardiopulmonary abnormality seen. Electronically Signed   By: Marijo Conception, M.D.   On: 10/05/2015 09:44     ASSESSMENT AND PLAN:   75 year old male with past medical history of colon cancer, hypertension, GERD, anxiety, history of alcohol and tobacco abuse who presents to the hospital with right upper quadrant abdominal pain nausea vomiting.   #1 acute cholecystitis-this is the cause of patient's right upper quadrant abdominal pain nausea vomiting. -Patient is status post percutaneous cholecystostomy tube placement. -Blood cultures are positive for Klebsiella and will narrow down antibiotics to IV ceftriaxone and monitor. -Continue supportive care as per general surgery. Patient likely will need a laparoscopic cholecystectomy but at a later time as pt. Was noted to NSTEMI  #2 NSTEMI - she has ruled in by cardiac markers as his troponin is now elevated to over 20. Clinically he denies any chest pain. -Continue aspirin,  heparin drip, beta blocker, statin. -Echocardiogram pending, appreciate cardiology input and likely will need a cardiac catheterization once his sepsis is improved.  #3 sepsis-due to acute cholecystitis. -Afebrile, hemodynamically stable. Blood cultures positive for Klebsiella and Enterobacter. Was on IV Zosyn and will narrow down to IV ceftriaxone. -Likely source is the acute cholecystitis.  #4 SVT/sinus tachycardia-patient apparently had SVT with heart rates in the 160s but that has significantly improved with some as needed Cardizem/Adenosine and IV fluids and treatment for underlying sepsis. -Currently patient is in sinus tachycardia and is probably related to underlying infection  -Continue IV fluid, IV abx (ceftriaxone) and follow HR.  -Continue metoprolol.  #5. Essential hypertension - cont. Metoprolol, Lisinopril  #6. Hyperlipidemia - cont. Atorvastatin  #7. GERD - cont. Protonix.    All the records are reviewed and case discussed with Care Management/Social Workerr. Management plans discussed with the patient, family and they are in agreement.  CODE STATUS: Full  DVT Prophylaxis: Heparin drip  TOTAL TIME TAKING CARE OF THIS PATIENT: 30 minutes.   POSSIBLE D/C IN 1-2 DAYS, DEPENDING ON CLINICAL CONDITION.   Henreitta Leber M.D on 10/06/2015 at 5:21 PM  Between 7am to 6pm - Pager - [phone]  After 6pm go to [email or web address] - password [email or web address] [facility]  Office  [phone]  CC: Primary care physician; Pcp Not In System

## 2015-10-06 NOTE — Progress Notes (Signed)
Pt had elevated troponin level of 7.74.  Dr. Dahlia Byes was on the unit checking on pt.  Troponin level was reported to Dr. Dahlia Byes.  Dr. Dahlia Byes ordered for 325mg  aspirin to be given as well as metoprolol.  A stat EKG was ordered and performed. A lactated ringers bolus of 500cc was ordered and given. Labs were drawn. Pt did not complain of any chest pain.  Pt did not complain of any shortness of breath.

## 2015-10-06 NOTE — Consult Note (Signed)
Cardiology Consult    Patient ID: Preston Bell MRN: [id number], DOB/AGE: [birth year]   Admit date: 10/05/2015 Date of Consult: 10/06/2015  Primary Physician: Pcp Not In System Reason for Consult: SVT; NSTEMI Primary Cardiologist: New to [place] Requesting Provider: Dr. Adonis Huguenin   History of Present Illness    Preston Bell is a 75 y.o. male with past medical history of HTN, HLD, and colon cancer (s/p colostomy) who presented to [facility] on 10/05/2015 for nausea, vomiting and diarrhea starting the previous day.  The patient reports he developed sudden onset nausea and vomiting around 7:00 PM on 10/04/2015. He denies consuming anything "unusual" that day. He reports having frequent episodes of diarrhea overnight as well. Denies any hematochezia or melena. The symptoms persisted into the following day and prompted him to come to the ED for evaluation.   While in the ED, he was tachycardiac into the 150's. An EKG was obtained which showed sinus tachycardia.There was concern of atrial fibrillation, so they administered Cardizem but this did not influence his rate. In reviewing his EKG there are p-waves present. He was later given Adenosine for possible SVT which brought his HR into the 130's.   His Lipase was elevated to 366. Creatinine elevated to 1.51. WBC 23.1. Hgb 13.2. Platelets 238. Lactic Acid 8.9. Initial troponin was 0.04. Blood culture positive for Enterobacteriaceae species. CT of the abdomen showed cholelelithiasis with gallbladder thickening with pericholecystic fluid and surrounding inflammatory changes most consistent with acute cholecystitis. He was initially going to be taken for an acute cholecystectomy but he was not due to his current high-surgical risk. IR was consulted and a percutaneous cholecystostomy with placement of a drainage catheter was placed into the gallbladder lumen.  Cyclic troponin values have continued to increase, climbing from 0.04 to 7.74, 13.17, and now  19.47 on his last blood draw. A repeat EKG was obtained this morning which showed Sinus tachycardia, HR 107, TWI in leads III and V1 and slight ST depression in V4-V6.    In talking with the patient, he reports a history of HTN and HLD but denies a known history of CAD. Reports having a "mild heart attack" in 1991 but did not require PCI or stent placement. He denies any recent chest pain, palpitations, shortness of breath, orthopnea, PND, or lower extremity. He reports living with his daughter currently and performs all of the outside work at the home without any difficulty. 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  Reports his nausea and vomiting have improved since being admitted. Since having some right-sided abdominal pain but says it is minimal compared to his initial presentation.   Past Medical History   Past Medical History  Diagnosis Date  . Cancer ([place])   . History of colectomy   . Hypertension   . GERD (gastroesophageal reflux disease)   . HLD (hyperlipidemia)     Past Surgical History  Procedure Laterality Date  . Colostomy reversal       Allergies No Known Allergies  Inpatient Medications    . aspirin EC  81 mg Oral Daily  . atorvastatin  40 mg Oral Daily  . lisinopril  40 mg Oral Daily  . metoprolol succinate  50 mg Oral Daily  . mirtazapine  15 mg Oral QHS  . pantoprazole (PROTONIX) IV  40 mg Intravenous QHS  . piperacillin-tazobactam (ZOSYN)  IV  3.375  g Intravenous 3 times per day    Family History    Family History  Problem Relation Age of Onset  . Cervical cancer Mother   . Lung cancer Father   . Lung cancer Brother   . Hypertension Brother   . Hypertension Son     Social History    Social History   Social History  . Marital Status: Legally Separated    Spouse Name: N/A  . Number of Children: N/A  . Years of Education: N/A   Occupational  History  . Not on file.   Social History Main Topics  . Smoking status: Former Smoker -- 1.00 packs/day for 30 years    Quit date: 10/06/1983  . Smokeless tobacco: Never Used  . Alcohol Use: No     Comment: Quit consuming alcohol in 1988.  . Drug Use: No  . Sexual Activity: Not on file   Other Topics Concern  . Not on file   Social History Narrative     Review of Systems    General:  No chills, fever, night sweats or weight changes.  Cardiovascular:  No chest pain, dyspnea on exertion, edema, orthopnea, palpitations, paroxysmal nocturnal dyspnea. Dermatological: No rash, lesions/masses Respiratory: No cough, dyspnea Urologic: No hematuria, dysuria Abdominal:   No bright red blood per rectum, melena, or hematemesis. Positive for nausea, vomiting, and diarrhea. Neurologic:  No visual changes, wkns, changes in mental status. All other systems reviewed and are otherwise negative except as noted above.  Physical Exam    Blood pressure 125/72, pulse 105, temperature 98.1 F (36.7 C), temperature source Oral, resp. rate 24, height 6\' 2"  (1.88 m), weight 171 lb 15.3 oz (78 kg), SpO2 95 %.  General: Pleasant, Caucasian male appearing in NAD Psych: Normal affect. Neuro: Alert and oriented X 3. Moves all extremities spontaneously. HEENT: Normal  Neck: Supple without bruits or JVD. Lungs:  Resp regular and unlabored, CTA without wheezing or rales. Heart: RRR no s3, s4, or murmurs. Abdomen: Soft, non-distended, BS + x 4. Tender to palpation in RUQ and RLQ. Drains in place. Extremities: No clubbing, cyanosis or edema. DP/PT/Radials 2+ and equal bilaterally.  Labs    Troponin (Point of Care Test) No results for input(s): TROPIPOC in the last 72 hours.  Recent Labs  10/05/15 0839 10/05/15 1858 10/05/15 2141 10/06/15 0303  TROPONINI 0.04* 7.74* 13.17* 19.47*   Lab Results  Component Value Date   WBC 11.3* 10/06/2015   HGB 10.8* 10/06/2015   HCT 31.4* 10/06/2015   MCV  94.2 10/06/2015   PLT 115* 10/06/2015     Recent Labs Lab 10/06/15 0303  NA 136  K 3.8  CL 103  CO2 27  BUN 18  CREATININE 0.99  CALCIUM 8.1*  PROT 5.9*  BILITOT 2.2*  ALKPHOS 58  ALT 103*  AST 174*  GLUCOSE 125*   No results found for: CHOL, HDL, LDLCALC, TRIG No results found for: [facility]   Radiology Studies    Dg Abd 1 View: 10/05/2015  CLINICAL DATA:  Abdominal pain.  Colon cancer.  Possible sepsis. EXAM: ABDOMEN - 1 VIEW COMPARISON:  Chest radiograph of same date. FINDINGS: Portable AP upright view of the abdomen pelvis. No free intraperitoneal air. A right abdominal air-fluid level could be within the proximal small bowel or distal stomach. Mild paucity of bowel gas otherwise. Exclusion of the pelvis. No abnormal abdominal calcifications. No appendicolith. Convex left lumbar spine curvature with advanced spondylosis. IMPRESSION: Nonspecific bowel gas pattern. Relative paucity  of bowel gas with an isolated right abdominal fluid level. No free intraperitoneal air identified Electronically Signed   By: Abigail Miyamoto M.D.   On: 10/05/2015 09:45   [place] Intraoperative: 10/05/2015  CLINICAL DATA:  Ultrasound was provided for use by the ordering physician, and a technical charge was applied by the performing facility.  No radiologist interpretation/professional services rendered.   Ct Abdomen Pelvis W Contrast: 10/05/2015  CLINICAL DATA:  Abdominal pain since 8 o'clock last night. Vomiting and diarrhea. EXAM: CT ABDOMEN AND PELVIS WITH CONTRAST TECHNIQUE: Multidetector CT imaging of the abdomen and pelvis was performed using the standard protocol following bolus administration of intravenous contrast. CONTRAST:  64mL OMNIPAQUE IOHEXOL 300 MG/ML  SOLN COMPARISON:  None. FINDINGS: Lower chest: Mild bibasilar atelectasis. The mild right basilar calcified pleural plaque with a 4.2 x 1.5 cm nodular area adjacently which may reflect focal pleural thickening versus fibrosis. Normal heart size. 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 Hepatobiliary: Normal liver. Cholelithiasis with gallbladder wall thickening with pericholecystic fluid and surrounding inflammatory changes . Pancreas: Normal. Spleen: Normal. Adrenals/Urinary Tract: Normal adrenal glands. Normal kidneys. Normal bladder. Stomach/Bowel: No bowel wall thickening or dilatation. No pneumatosis, pneumoperitoneum or portal venous gas. Small amount of abdominal free fluid extending into the right pericolic gutter. Vascular/Lymphatic: Normal caliber abdominal aorta with atherosclerosis. No lymphadenopathy. Other: No fluid collection or hematoma. Musculoskeletal: No lytic or sclerotic osseous lesion. No aggressive osseous abnormality. 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 IMPRESSION: 1. Cholelithiasis with gallbladder wall thickening with pericholecystic fluid and surrounding inflammatory changes most concerning for acute cholecystitis. 2. Mild right basilar calcified pleural plaque with a 4.2 x 1.5 cm nodular area adjacently which may reflect focal pleural thickening versus fibrosis. Recommend follow-up CT chest in 3-6 months. Electronically Signed   By: Kathreen Devoid   On: 10/05/2015 11:11   Ir Perc Cholecystostomy: 10/05/2015  CLINICAL DATA:  Acute calculus cholecystitis. The patient is at high risk for immediate cholecystectomy and requires percutaneous cholecystostomy tube placement. EXAM: PERCUTANEOUS CHOLECYSTOSTOMY COMPARISON:  CT of the abdomen earlier today ANESTHESIA/SEDATION: 3.5 mg IV Versed; 125 mcg IV Fentanyl. Total Moderate Sedation Time 15 minutes. The patient's level of consciousness and physiologic status were continuously monitored during the procedure by Radiology nursing. CONTRAST:  38mL OMNIPAQUE IOHEXOL 300 MG/ML  SOLN MEDICATIONS: No additional medications. FLUOROSCOPY TIME:  30 seconds. PROCEDURE: The procedure, risks, benefits, and alternatives were  explained to the patient. Questions regarding the procedure were encouraged and answered. The patient understands and consents to the procedure. A time-out was performed prior to the procedure. Ultrasound was used to localize the gallbladder. The right abdominal wall was prepped with chlorhexidine in a sterile fashion, and a sterile drape was applied covering the operative field. A sterile gown and sterile gloves were used for the procedure. Local anesthesia was provided with 1% Lidocaine. Ultrasound image documentation was performed. Fluoroscopy during the procedure and fluoro spot radiograph confirms appropriate catheter position. Under direct ultrasound guidance, a 21 gauge needle was advanced into the gallbladder lumen. Aspiration was performed and a bile sample sent for culture studies. A small amount of diluted contrast material was injected. A guide wire was then advanced into the gallbladder. A transitional dilator was placed. Percutaneous tract dilatation was then performed over a guide wire to 10-French. A 10-French pigtail drainage catheter was then advanced into the gallbladder lumen under fluoroscopy. Catheter was formed and injected with contrast material to  confirm position. The catheter was flushed and connected to a gravity drainage bag. It was secured at the skin with a Prolene retention suture and Stat-Lock device. COMPLICATIONS: None FINDINGS: A cholecystostomy tube was advanced into the gallbladder lumen and formed. It is now draining bile. This tube will be left to gravity drainage. IMPRESSION: Percutaneous cholecystostomy with placement of 10-French drainage catheter into the gallbladder lumen. This was left to gravity drainage. Electronically Signed   By: Aletta Edouard M.D.   On: 10/05/2015 16:33   Dg Chest Portable 1 View: 10/05/2015  CLINICAL DATA:  Vomiting. EXAM: PORTABLE CHEST 1 VIEW COMPARISON:  None. FINDINGS: The heart size and mediastinal contours are within normal limits. Both  lungs are clear. No pneumothorax or pleural effusion is noted. The visualized skeletal structures are unremarkable. IMPRESSION: No acute cardiopulmonary abnormality seen. Electronically Signed   By: Marijo Conception, M.D.   On: 10/05/2015 09:44    EKG & Cardiac Imaging    EKG: Sinus tachycardia, HR 107, TWI in leads III and V1 and slight ST depression in V4-V6  Echocardiogram: Pending  Assessment & Plan     1. Elevated Troponin - Cyclic troponin values have been 0.04 to 7.74, 13.17, and now 19.47 on his last blood draw. EKG shows sinus tachycardia with TWI in leads III and V1 with slight ST depression in V4-V6 (ST depression similar to tracing in 2015).  - denies any recent chest pain, dyspnea with exertion, or anginal equivalents. - denies any known CAD history although he had a "mild heart attack" in 1991 not requiring PCI or stent placement at that time. Does have cardiac risk factors including HTN, HLD, and a history of tobacco use. His CT of the abdomen did show a mild coronary artery atherosclerotic cyst in the LAD. - echocardiogram is pending. - continue 81mg  ASA, statin, ACE-I, and BB. Heparin has been started and being dosed by pharmacy. - will likely require cardiac catheterization with his significantly elevated troponin values once he improves from his Sepsis and is more stable. Will continue to cycle his values as they are still trending upwards.  2. HTN - BP has been 108/52 - 158/78 in the past 24 hours. - continue current treatment  3. HLD - continue statin therapy  4. Acute Cholecystitis/ Sepsis  - presented with nausea, vomiting, and diarrhea. - Lactic Acid 8.9, WBC 23.1 upon arrival to the ED. WBC improved to 11.3 and Lactic Acid at 3.2 on recent lab draws. Tachycardic into the 150's upon arrival with questionable episodes of SVT. HR now improved into the 100's - 110's.  - CT of the abdomen showed acute cholecystitis. He was initially going to be taken for an acute  cholecystectomy but he was not due to his current high-surgical risk. IR was consulted and a percutaneous cholecystostomy with placement of a drainage catheter was placed into the gallbladder lumen.   Signed, Erma Heritage, PA-C 10/06/2015, 10:47 AM Pager: [phone]

## 2015-10-06 NOTE — Progress Notes (Signed)
CC: Cholecystitis Subjective: Patient did well overnight. States his abdominal pain is much improved than on admission. Only complaint of tenderness now is around the actual insertion site of his biliary drain. Tolerated his clear liquid diet. Did have gradual increase of serial troponins and his care was transitioned to the internal medicine team is primary.  Objective: Vital signs in last 24 hours: Temp:  [98 F (36.7 C)-99.3 F (37.4 C)] 98.1 F (36.7 C) (02/10 0700) Pulse Rate:  [98-133] 106 (02/10 1100) Resp:  [18-32] 28 (02/10 1100) BP: (108-156)/(52-78) 129/69 mmHg (02/10 1100) SpO2:  [90 %-99 %] 96 % (02/10 1100) Weight:  [78 kg (171 lb 15.3 oz)] 78 kg (171 lb 15.3 oz) (02/09 1700) Last BM Date: 10/05/15  Intake/Output from previous day: 02/09 0701 - 02/10 0700 In: 2154.7 [I.V.:2004.7; IV Piggyback:150] Out: [id number] [Urine:1775] Intake/Output this shift: Total I/O In: 125 [I.V.:125] Out: -   Physical exam:  Gen.: No acute distress taxine chest: Clear to auscultation Heart: Tachycardic Abdomen: Soft, nondistended, minimally tender to palpation around the biliary drain site. No evidence of peritonitis. No spreading erythema from biliary drain site biliary drain is draining bile.  Lab Results: CBC   Recent Labs  10/05/15 0839 10/06/15 0303  WBC 23.1* 11.3*  HGB 13.2 10.8*  HCT 39.3* 31.4*  PLT 238 115*   BMET  Recent Labs  10/05/15 2141 10/06/15 0303  NA 136 136  K 4.1 3.8  CL 105 103  CO2 24 27  GLUCOSE 151* 125*  BUN 19 18  CREATININE 1.07 0.99  CALCIUM 7.9* 8.1*   PT/INR  Recent Labs  10/05/15 0839  LABPROT 15.2*  INR 1.18   ABG No results for input(s): PHART, HCO3 in the last 72 hours.  Invalid input(s): PCO2, PO2  Studies/Results: Dg Abd 1 View  10/05/2015  CLINICAL DATA:  Abdominal pain.  Colon cancer.  Possible sepsis. EXAM: ABDOMEN - 1 VIEW COMPARISON:  Chest radiograph of same date. FINDINGS: Portable AP upright view of the abdomen  pelvis. No free intraperitoneal air. A right abdominal air-fluid level could be within the proximal small bowel or distal stomach. Mild paucity of bowel gas otherwise. Exclusion of the pelvis. No abnormal abdominal calcifications. No appendicolith. Convex left lumbar spine curvature with advanced spondylosis. IMPRESSION: Nonspecific bowel gas pattern. Relative paucity of bowel gas with an isolated right abdominal fluid level. No free intraperitoneal air identified Electronically Signed   By: Abigail Miyamoto M.D.   On: 10/05/2015 09:45   [place] Intraoperative  10/05/2015  CLINICAL DATA:  Ultrasound was provided for use by the ordering physician, and a technical charge was applied by the performing facility.  No radiologist interpretation/professional services rendered.   Ct Abdomen Pelvis W Contrast  10/05/2015  CLINICAL DATA:  Abdominal pain since 8 o'clock last night. Vomiting and diarrhea. EXAM: CT ABDOMEN AND PELVIS WITH CONTRAST TECHNIQUE: Multidetector CT imaging of the abdomen and pelvis was performed using the standard protocol following bolus administration of intravenous contrast. CONTRAST:  77mL OMNIPAQUE IOHEXOL 300 MG/ML  SOLN COMPARISON:  None. FINDINGS: Lower chest: Mild bibasilar atelectasis. The mild right basilar calcified pleural plaque with a 4.2 x 1.5 cm nodular area adjacently which may reflect focal pleural thickening versus fibrosis. Normal heart size. Mild coronary artery atherosclerotic cyst in the LAD. Hepatobiliary: Normal liver. Cholelithiasis with gallbladder wall thickening with pericholecystic fluid and surrounding inflammatory changes . Pancreas: Normal. Spleen: Normal. Adrenals/Urinary Tract: Normal adrenal glands. Normal kidneys. Normal bladder. Stomach/Bowel: No bowel wall thickening or  dilatation. No pneumatosis, pneumoperitoneum or portal venous gas. Small amount of abdominal free fluid extending into the right pericolic gutter. Vascular/Lymphatic: Normal caliber abdominal aorta  with atherosclerosis. No lymphadenopathy. Other: No fluid collection or hematoma. Musculoskeletal: No lytic or sclerotic osseous lesion. No aggressive osseous abnormality. 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 IMPRESSION: 1. Cholelithiasis with gallbladder wall thickening with pericholecystic fluid and surrounding inflammatory changes most concerning for acute cholecystitis. 2. Mild right basilar calcified pleural plaque with a 4.2 x 1.5 cm nodular area adjacently which may reflect focal pleural thickening versus fibrosis. Recommend follow-up CT chest in 3-6 months. Electronically Signed   By: Kathreen Devoid   On: 10/05/2015 11:11   Ir Perc Cholecystostomy  10/05/2015  CLINICAL DATA:  Acute calculus cholecystitis. The patient is at high risk for immediate cholecystectomy and requires percutaneous cholecystostomy tube placement. EXAM: PERCUTANEOUS CHOLECYSTOSTOMY COMPARISON:  CT of the abdomen earlier today ANESTHESIA/SEDATION: 3.5 mg IV Versed; 125 mcg IV Fentanyl. Total Moderate Sedation Time 15 minutes. The patient's level of consciousness and physiologic status were continuously monitored during the procedure by Radiology nursing. CONTRAST:  42mL OMNIPAQUE IOHEXOL 300 MG/ML  SOLN MEDICATIONS: No additional medications. FLUOROSCOPY TIME:  30 seconds. PROCEDURE: The procedure, risks, benefits, and alternatives were explained to the patient. Questions regarding the procedure were encouraged and answered. The patient understands and consents to the procedure. A time-out was performed prior to the procedure. Ultrasound was used to localize the gallbladder. The right abdominal wall was prepped with chlorhexidine in a sterile fashion, and a sterile drape was applied covering the operative field. A sterile gown and sterile gloves were used for the procedure. Local anesthesia was provided with 1% Lidocaine. Ultrasound image documentation was performed.  Fluoroscopy during the procedure and fluoro spot radiograph confirms appropriate catheter position. Under direct ultrasound guidance, a 21 gauge needle was advanced into the gallbladder lumen. Aspiration was performed and a bile sample sent for culture studies. A small amount of diluted contrast material was injected. A guide wire was then advanced into the gallbladder. A transitional dilator was placed. Percutaneous tract dilatation was then performed over a guide wire to 10-French. A 10-French pigtail drainage catheter was then advanced into the gallbladder lumen under fluoroscopy. Catheter was formed and injected with contrast material to confirm position. The catheter was flushed and connected to a gravity drainage bag. It was secured at the skin with a Prolene retention suture and Stat-Lock device. COMPLICATIONS: None FINDINGS: A cholecystostomy tube was advanced into the gallbladder lumen and formed. It is now draining bile. This tube will be left to gravity drainage. IMPRESSION: Percutaneous cholecystostomy with placement of 10-French drainage catheter into the gallbladder lumen. This was left to gravity drainage. Electronically Signed   By: Aletta Edouard M.D.   On: 10/05/2015 16:33   Dg Chest Portable 1 View  10/05/2015  CLINICAL DATA:  Vomiting. EXAM: PORTABLE CHEST 1 VIEW COMPARISON:  None. FINDINGS: The heart size and mediastinal contours are within normal limits. Both lungs are clear. No pneumothorax or pleural effusion is noted. The visualized skeletal structures are unremarkable. IMPRESSION: No acute cardiopulmonary abnormality seen. Electronically Signed   By: Marijo Conception, M.D.   On: 10/05/2015 09:44    Anti-infectives: Anti-infectives    Start     Dose/Rate Route Frequency Ordered Stop   10/05/15 1745  piperacillin-tazobactam (ZOSYN) IVPB 3.375 g     3.375 g 12.5 mL/hr over 240 Minutes Intravenous 3 times  per day 10/05/15 1740     10/05/15 0945  vancomycin (VANCOCIN) IVPB 1000 mg/200  mL premix     1,000 mg 200 mL/hr over 60 Minutes Intravenous  Once 10/05/15 0935 10/05/15 1106   10/05/15 0845  piperacillin-tazobactam (ZOSYN) IVPB 3.375 g     3.375 g 12.5 mL/hr over 240 Minutes Intravenous  Once 10/05/15 [id number] 10/05/15 0932      Assessment/Plan:  75 year old male admitted for acute cholecystitis. He's been found to have suffered a myocardial infarction. We'll continue to treat his cholecystitis with biliary drain for decompression and IV antibiotics. Labs are greatly improved today. Patient's care has been transitioned to the medical service for treatment of his new cardiac findings. Surgery will continue to follow his lungs patient is an inpatient and then will manage as an outpatient for his biliary drain and eventual need for cholecystectomy. Appreciate assistance of internal medicine and cardiology with this patient. Okay from his surgery standpoint for advancement of diet and transfer out of the ICU.  Raziel Koenigs T. Adonis Huguenin, MD, FACS  10/06/2015

## 2015-10-06 NOTE — Progress Notes (Addendum)
ANTICOAGULATION CONSULT NOTE - Initial Consult  Pharmacy Consult for heparin drip Indication: ACS/STEMI  No Known Allergies  Patient Measurements: Height: 6\' 2"  (188 cm) Weight: 171 lb 15.3 oz (78 kg) IBW/kg (Calculated) : 82.2 Heparin Dosing Weight: 78kg  Vital Signs: Temp: 99.3 F (37.4 C) (02/10 1658) Temp Source: Oral (02/10 1658) BP: 134/51 mmHg (02/10 1658) Pulse Rate: 110 (02/10 1658)  Labs:  Recent Labs  10/05/15 0839  10/05/15 2141 10/06/15 0303 10/06/15 0804 10/06/15 1119 10/06/15 1731  HGB 13.2  --   --  10.8*  --   --   --   HCT 39.3*  --   --  31.4*  --   --   --   PLT 238  --   --  115*  --   --   --   APTT 31  --   --   --   --   --   --   LABPROT 15.2*  --   --   --   --   --   --   INR 1.18  --   --   --   --   --   --   HEPARINUNFRC  --   --   --   --  0.27*  --  0.22*  CREATININE 1.51*  --  1.07 0.99  --   --   --   TROPONINI 0.04*  < > 13.17* 19.47*  --  13.95* 11.27*  < > = values in this interval not displayed.  Estimated Creatinine Clearance: 72.2 mL/min (by C-G formula based on Cr of 0.99).   Medical History: Past Medical History  Diagnosis Date  . Cancer ([place])   . History of colectomy   . Hypertension   . GERD (gastroesophageal reflux disease)   . HLD (hyperlipidemia)     Medications:    Assessment: Hgb 13.2  plt 230 INR 1.18  aPTT 31 No outpatient anticoag in med rec.  Goal of Therapy:  Heparin level 0.3-0.7 units/ml Monitor platelets by anticoagulation protocol: Yes   Plan:  Heparin level is below goal so will bolus heparin 1200 units and increase infusion to 1100 units/hr. Will recheck a HL in 8 hours.   2/10:  HL @ 17:30 = 0.22 Will order Heparin 1200 units IV X 1 bolus and increase drip rate to 1250 units/hr. Will recheck HL 8 hrs after rate change on 2/11 @ 0230.   Reine Bristow D 10/06/2015,6:28 PM

## 2015-10-06 NOTE — Progress Notes (Signed)
ANTICOAGULATION CONSULT NOTE - Initial Consult  Pharmacy Consult for heparin drip Indication: ACS/STEMI  No Known Allergies  Patient Measurements: Height: 6\' 2"  (188 cm) Weight: 171 lb 15.3 oz (78 kg) IBW/kg (Calculated) : 82.2 Heparin Dosing Weight: 78kg  Vital Signs: Temp: 98.1 F (36.7 C) (02/10 1100) Temp Source: Oral (02/10 1100) BP: 125/70 mmHg (02/10 1400) Pulse Rate: 110 (02/10 1400)  Labs:  Recent Labs  10/05/15 0839  10/05/15 2141 10/06/15 0303 10/06/15 0804 10/06/15 1119  HGB 13.2  --   --  10.8*  --   --   HCT 39.3*  --   --  31.4*  --   --   PLT 238  --   --  115*  --   --   APTT 31  --   --   --   --   --   LABPROT 15.2*  --   --   --   --   --   INR 1.18  --   --   --   --   --   HEPARINUNFRC  --   --   --   --  0.27*  --   CREATININE 1.51*  --  1.07 0.99  --   --   TROPONINI 0.04*  < > 13.17* 19.47*  --  13.95*  < > = values in this interval not displayed.  Estimated Creatinine Clearance: 72.2 mL/min (by C-G formula based on Cr of 0.99).   Medical History: Past Medical History  Diagnosis Date  . Cancer ([place])   . History of colectomy   . Hypertension   . GERD (gastroesophageal reflux disease)   . HLD (hyperlipidemia)     Medications:    Assessment: Hgb 13.2  plt 230 INR 1.18  aPTT 31 No outpatient anticoag in med rec.  Goal of Therapy:  Heparin level 0.3-0.7 units/ml Monitor platelets by anticoagulation protocol: Yes   Plan:  Heparin level is below goal so will bolus heparin 1200 units and increase infusion to 1100 units/hr. Will recheck a HL in 8 hours.   Ulice Dash D 10/06/2015,3:03 PM

## 2015-10-06 NOTE — Progress Notes (Signed)
Pt is now resting comfortably.  Pt has been sinus rhythm, sinus tach on the cardiac monitor.  Lungs have been clear and pt is on 2L nasal cannula.  Pt remains alert and oriented.Pt remains on heparin drip at 950 units/hr.  Pt has had good urine output.  Bag remains hung to gravity drain and pt has had 325cc out.  Flush was done at 20:00.  Pt complained of pain in the abdomen 2x and morphine was given each time with relief.  Magnesium was replaced.  Will continue to monitor.

## 2015-10-06 NOTE — Progress Notes (Signed)
*  PRELIMINARY RESULTS* Echocardiogram 2D Echocardiogram has been performed.  Preston Bell 10/06/2015, 2:53 PM

## 2015-10-06 NOTE — Progress Notes (Signed)
Dr. Darvin Neighbours notified of 2nd troponin level of 13.17.  ECHO was ordered.  Verified with nursing supervisor that ECHO would have to be done during day shift.   Pt started on heparin drip per MD.  Also notified Dr. Darvin Neighbours that pt's magnesium level came back as 1.1.  Per MD, magnesium replacement was ordered.  Dr. Percival Spanish called to speak with hospitalist.  Number was given to Dr. Darvin Neighbours  [phone]

## 2015-10-07 DIAGNOSIS — A419 Sepsis, unspecified organism: Secondary | ICD-10-CM

## 2015-10-07 DIAGNOSIS — R197 Diarrhea, unspecified: Secondary | ICD-10-CM

## 2015-10-07 DIAGNOSIS — R1110 Vomiting, unspecified: Secondary | ICD-10-CM | POA: Insufficient documentation

## 2015-10-07 LAB — CULTURE, BLOOD (ROUTINE X 2)

## 2015-10-07 LAB — HEPARIN LEVEL (UNFRACTIONATED)
HEPARIN UNFRACTIONATED: 0.3 [IU]/mL (ref 0.30–0.70)
Heparin Unfractionated: 0.29 IU/mL — ABNORMAL LOW (ref 0.30–0.70)
Heparin Unfractionated: 0.31 IU/mL (ref 0.30–0.70)

## 2015-10-07 LAB — CBC
HEMATOCRIT: 30.2 % — AB (ref 40.0–52.0)
Hemoglobin: 10.5 g/dL — ABNORMAL LOW (ref 13.0–18.0)
MCH: 32.5 pg (ref 26.0–34.0)
MCHC: 34.8 g/dL (ref 32.0–36.0)
MCV: 93.6 fL (ref 80.0–100.0)
PLATELETS: 98 10*3/uL — AB (ref 150–440)
RBC: 3.22 MIL/uL — AB (ref 4.40–5.90)
RDW: 13.9 % (ref 11.5–14.5)
WBC: 9.1 10*3/uL (ref 3.8–10.6)

## 2015-10-07 LAB — TROPONIN I: Troponin I: 7.56 ng/mL — ABNORMAL HIGH (ref <0.031)

## 2015-10-07 MED ORDER — HEPARIN BOLUS VIA INFUSION
1200.0000 [IU] | Freq: Once | INTRAVENOUS | Status: AC
  Administered 2015-10-07: 1200 [IU] via INTRAVENOUS
  Filled 2015-10-07: qty 1200

## 2015-10-07 NOTE — Progress Notes (Signed)
Patient: Preston Bell / Admit Date: 10/05/2015 / Date of Encounter: 10/07/2015, 1:12 PM   Subjective: Belching, on clear liquid diet, Denies chest pain, on heparin infusion, has not ambulated  Review of Systems: Review of Systems  Constitutional: Negative.   Respiratory: Negative.   Cardiovascular: Negative.   Gastrointestinal: Positive for abdominal pain.  Musculoskeletal: Negative.   Neurological: Negative.   Psychiatric/Behavioral: Negative.   All other systems reviewed and are negative.   Objective: Telemetry: NSR, no significant arrhythmia Physical Exam: Blood pressure 161/72, pulse 121, temperature 98.4 F (36.9 C), temperature source Oral, resp. rate 20, height 6\' 2"  (1.88 m), weight 171 lb 15.3 oz (78 kg), SpO2 91 %. Body mass index is 22.07 kg/(m^2). General: Well developed, well nourished, in no acute distress. Head: Normocephalic, atraumatic, sclera non-icteric, no xanthomas, nares are without discharge. Neck: Negative for carotid bruits. JVP not elevated. Lungs: Clear bilaterally to auscultation without wheezes, rales, or rhonchi. Breathing is unlabored. Heart: RRR S1 S2 without murmurs, rubs, or gallops.  Abdomen: Soft, non-tender, non-distended with normoactive bowel sounds.  Extremities: No clubbing or cyanosis. No edema. Distal pedal pulses are 2+ and equal bilaterally. Neuro: Alert and oriented X 3. Moves all extremities spontaneously. Psych:  Responds to questions appropriately with a normal affect.   Intake/Output Summary (Last 24 hours) at 10/07/15 1312 Last data filed at 10/07/15 0830  Gross per 24 hour  Intake    932 ml  Output   3470 ml  Net  -2538 ml    Inpatient Medications:  . aspirin EC  81 mg Oral Daily  . atorvastatin  40 mg Oral Daily  . cefTRIAXone (ROCEPHIN)  IV  2 g Intravenous Q24H  . lisinopril  20 mg Oral Daily  . metoprolol succinate  100 mg Oral Daily  . mirtazapine  15 mg Oral QHS  . pantoprazole (PROTONIX) IV  40 mg  Intravenous QHS   Infusions:  . sodium chloride 75 mL/hr at 10/07/15 1241  . heparin 1,400 Units/hr (10/07/15 1241)    Labs:  Recent Labs  10/05/15 2141 10/06/15 0303  NA 136 136  K 4.1 3.8  CL 105 103  CO2 24 27  GLUCOSE 151* 125*  BUN 19 18  CREATININE 1.07 0.99  CALCIUM 7.9* 8.1*  MG 1.1* 2.4  PHOS  --  2.7    Recent Labs  10/05/15 0839 10/06/15 0303  AST 190* 174*  ALT 173* 103*  ALKPHOS 125 58  BILITOT 1.9* 2.2*  PROT 8.4* 5.9*  ALBUMIN 4.8 3.2*    Recent Labs  10/05/15 0839 10/06/15 0303 10/07/15 0229  WBC 23.1* 11.3* 9.1  NEUTROABS 21.6*  --   --   HGB 13.2 10.8* 10.5*  HCT 39.3* 31.4* 30.2*  MCV 96.4 94.2 93.6  PLT 238 115* 98*    Recent Labs  10/06/15 0303 10/06/15 1119 10/06/15 1731 10/06/15 2331  TROPONINI 19.47* 13.95* 11.27* 7.56*   Invalid input(s): POCBNP No results for input(s): HGBA1C in the last 72 hours.   Weights: Filed Weights   10/05/15 0821 10/05/15 0952 10/05/15 1700  Weight: 170 lb (77.111 kg) 180 lb (81.647 kg) 171 lb 15.3 oz (78 kg)     Radiology/Studies:  Dg Abd 1 View  10/05/2015  CLINICAL DATA:  Abdominal pain.  Colon cancer.  Possible sepsis. EXAM: ABDOMEN - 1 VIEW COMPARISON:  Chest radiograph of same date. FINDINGS: Portable AP upright view of the abdomen pelvis. No free intraperitoneal air. A right abdominal air-fluid level  could be within the proximal small bowel or distal stomach. Mild paucity of bowel gas otherwise. Exclusion of the pelvis. No abnormal abdominal calcifications. No appendicolith. Convex left lumbar spine curvature with advanced spondylosis. IMPRESSION: Nonspecific bowel gas pattern. Relative paucity of bowel gas with an isolated right abdominal fluid level. No free intraperitoneal air identified Electronically Signed   By: Abigail Miyamoto M.D.   On: 10/05/2015 09:45   [place] Intraoperative  10/05/2015  CLINICAL DATA:  Ultrasound was provided for use by the ordering physician, and a technical charge  was applied by the performing facility.  No radiologist interpretation/professional services rendered.   Ct Abdomen Pelvis W Contrast  10/05/2015  CLINICAL DATA:  Abdominal pain since 8 o'clock last night. Vomiting and diarrhea. EXAM: CT ABDOMEN AND PELVIS WITH CONTRAST TECHNIQUE: Multidetector CT imaging of the abdomen and pelvis was performed using the standard protocol following bolus administration of intravenous contrast. CONTRAST:  35mL OMNIPAQUE IOHEXOL 300 MG/ML  SOLN COMPARISON:  None. FINDINGS: Lower chest: Mild bibasilar atelectasis. The mild right basilar calcified pleural plaque with a 4.2 x 1.5 cm nodular area adjacently which may reflect focal pleural thickening versus fibrosis. Normal heart size. Mild coronary artery atherosclerotic cyst in the LAD. Hepatobiliary: Normal liver. Cholelithiasis with gallbladder wall thickening with pericholecystic fluid and surrounding inflammatory changes . Pancreas: Normal. Spleen: Normal. Adrenals/Urinary Tract: Normal adrenal glands. Normal kidneys. Normal bladder. Stomach/Bowel: No bowel wall thickening or dilatation. No pneumatosis, pneumoperitoneum or portal venous gas. Small amount of abdominal free fluid extending into the right pericolic gutter. Vascular/Lymphatic: Normal caliber abdominal aorta with atherosclerosis. No lymphadenopathy. Other: No fluid collection or hematoma. Musculoskeletal: No lytic or sclerotic osseous lesion. No aggressive osseous abnormality. 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 IMPRESSION: 1. Cholelithiasis with gallbladder wall thickening with pericholecystic fluid and surrounding inflammatory changes most concerning for acute cholecystitis. 2. Mild right basilar calcified pleural plaque with a 4.2 x 1.5 cm nodular area adjacently which may reflect focal pleural thickening versus fibrosis. Recommend follow-up CT chest in 3-6 months. Electronically Signed   By: Kathreen Devoid   On: 10/05/2015 11:11   Ir Perc Cholecystostomy  10/05/2015  CLINICAL DATA:  Acute calculus cholecystitis. The patient is at high risk for immediate cholecystectomy and requires percutaneous cholecystostomy tube placement. EXAM: PERCUTANEOUS CHOLECYSTOSTOMY COMPARISON:  CT of the abdomen earlier today ANESTHESIA/SEDATION: 3.5 mg IV Versed; 125 mcg IV Fentanyl. Total Moderate Sedation Time 15 minutes. The patient's level of consciousness and physiologic status were continuously monitored during the procedure by Radiology nursing. CONTRAST:  29mL OMNIPAQUE IOHEXOL 300 MG/ML  SOLN MEDICATIONS: No additional medications. FLUOROSCOPY TIME:  30 seconds. PROCEDURE: The procedure, risks, benefits, and alternatives were explained to the patient. Questions regarding the procedure were encouraged and answered. The patient understands and consents to the procedure. A time-out was performed prior to the procedure. Ultrasound was used to localize the gallbladder. The right abdominal wall was prepped with chlorhexidine in a sterile fashion, and a sterile drape was applied covering the operative field. A sterile gown and sterile gloves were used for the procedure. Local anesthesia was provided with 1% Lidocaine. Ultrasound image documentation was performed. Fluoroscopy during the procedure and fluoro spot radiograph confirms appropriate catheter position. Under direct ultrasound guidance, a 21 gauge needle was advanced into the gallbladder lumen. Aspiration was performed and a bile sample sent for culture studies. A small amount of diluted contrast material was injected. A guide wire was then  advanced into the gallbladder. A transitional dilator was placed. Percutaneous tract dilatation was then performed over a guide wire to 10-French. A 10-French pigtail drainage catheter was then advanced into the gallbladder lumen under fluoroscopy. Catheter was formed and injected with contrast material to confirm position. The  catheter was flushed and connected to a gravity drainage bag. It was secured at the skin with a Prolene retention suture and Stat-Lock device. COMPLICATIONS: None FINDINGS: A cholecystostomy tube was advanced into the gallbladder lumen and formed. It is now draining bile. This tube will be left to gravity drainage. IMPRESSION: Percutaneous cholecystostomy with placement of 10-French drainage catheter into the gallbladder lumen. This was left to gravity drainage. Electronically Signed   By: Aletta Edouard M.D.   On: 10/05/2015 16:33   Dg Chest Portable 1 View  10/05/2015  CLINICAL DATA:  Vomiting. EXAM: PORTABLE CHEST 1 VIEW COMPARISON:  None. FINDINGS: The heart size and mediastinal contours are within normal limits. Both lungs are clear. No pneumothorax or pleural effusion is noted. The visualized skeletal structures are unremarkable. IMPRESSION: No acute cardiopulmonary abnormality seen. Electronically Signed   By: Marijo Conception, M.D.   On: 10/05/2015 09:44     Assessment and Plan  75 y.o. male    1. Elevated Troponin troponin 19.47 at peak consistent with NSTEMI  "mild heart attack" in 1991 not requiring PCI or stent placement at that time.  Does have cardiac risk factors including HTN, HLD, and a history of tobacco use.  EF >55% on echo On  Heparin infusion cardiac catheterization scheduled for Monday AM  2. HTN - continue current treatment  3. HLD - continue statin therapy  4. Acute Cholecystitis/ Sepsis  - presented with nausea, vomiting, and diarrhea. - Lactic Acid 8.9, WBC 23.1 upon arrival to the ED. WBC improved to 11.3 and Lactic Acid at 3.2 on recent lab draws. Tachycardic into the 150's upon arrival with questionable episodes of SVT.  - CT of the abdomen showed acute cholecystitis.   current high-surgical risk.  IR was consulted and a percutaneous cholecystostomy with placement of a drainage catheter was placed into the gallbladder lumen.   Signed, Esmond Plants, MD,  Ph.D. [facility] 10/07/2015, 1:12 PM

## 2015-10-07 NOTE — Progress Notes (Signed)
ANTICOAGULATION CONSULT NOTE - Initial Consult  Pharmacy Consult for heparin drip Indication: ACS/STEMI  No Known Allergies  Patient Measurements: Height: 6\' 2"  (188 cm) Weight: 171 lb 15.3 oz (78 kg) IBW/kg (Calculated) : 82.2 Heparin Dosing Weight: 78kg  Vital Signs: Temp: 99.1 F (37.3 C) (02/11 0429) Temp Source: Oral (02/11 0429) BP: 126/69 mmHg (02/11 0429) Pulse Rate: 114 (02/11 0429)  Labs:  Recent Labs  10/05/15 0839  10/05/15 2141 10/06/15 0303 10/06/15 0804 10/06/15 1119 10/06/15 1731 10/06/15 2331 10/07/15 0229  HGB 13.2  --   --  10.8*  --   --   --   --  10.5*  HCT 39.3*  --   --  31.4*  --   --   --   --  30.2*  PLT 238  --   --  115*  --   --   --   --  98*  APTT 31  --   --   --   --   --   --   --   --   LABPROT 15.2*  --   --   --   --   --   --   --   --   INR 1.18  --   --   --   --   --   --   --   --   HEPARINUNFRC  --   --   --   --  0.27*  --  0.22*  --  0.29*  CREATININE 1.51*  --  1.07 0.99  --   --   --   --   --   TROPONINI 0.04*  < > 13.17* 19.47*  --  13.95* 11.27* 7.56*  --   < > = values in this interval not displayed.  Estimated Creatinine Clearance: 72.2 mL/min (by C-G formula based on Cr of 0.99).   Medical History: Past Medical History  Diagnosis Date  . Cancer ([place])   . History of colectomy   . Hypertension   . GERD (gastroesophageal reflux disease)   . HLD (hyperlipidemia)     Medications:    Assessment: Hgb 13.2  plt 230 INR 1.18  aPTT 31 No outpatient anticoag in med rec.  Goal of Therapy:  Heparin level 0.3-0.7 units/ml Monitor platelets by anticoagulation protocol: Yes   Plan:  Heparin level is below goal so will bolus heparin 1200 units and increase infusion to 1100 units/hr. Will recheck a HL in 8 hours.   2/10:  HL @ 17:30 = 0.22 Will order Heparin 1200 units IV X 1 bolus and increase drip rate to 1250 units/hr. Will recheck HL 8 hrs after rate change on 2/11 @ 0230.   2/11 02:30 heparin level  0.29. 1200 unit bolus and increase to 1400 units/hr. Recheck in 8 hours.  Ashlynd Michna S 10/07/2015,4:38 AM

## 2015-10-07 NOTE — Progress Notes (Signed)
CC: Cholecystitis Subjective: 75 year old male continued do well after percutaneous cholecystostomy tube placement for his acute cholecystitis. Only complaint of pain is of minimal soreness at the drain insertion site. He denies any fevers, chills, nausea, vomiting, chest pain, shortness of breath, diarrhea, constipation. He has tolerated the diet that he has had thus far.  Objective: Vital signs in last 24 hours: Temp:  [98.1 F (36.7 C)-99.3 F (37.4 C)] 99 F (37.2 C) (02/11 0800) Pulse Rate:  [106-116] 116 (02/11 0800) Resp:  [16-29] 16 (02/11 0800) BP: (121-138)/(32-74) 123/70 mmHg (02/11 0800) SpO2:  [87 %-98 %] 91 % (02/11 0800) Last BM Date: 10/05/15  Intake/Output from previous day: 02/10 0701 - 02/11 0700 In: 1165 [P.O.:760; I.V.:405] Out: 2770 [Urine:2600; Drains:170] Intake/Output this shift: Total I/O In: -  Out: 700 [Urine:700]  Physical exam:  Gen.: No acute distress Chest: Clear to auscultation Heart: Tachycardic Abdomen: Soft, minimally tender at the drain site, nondistended. Biliary drain in place draining bile.  Lab Results: CBC   Recent Labs  10/06/15 0303 10/07/15 0229  WBC 11.3* 9.1  HGB 10.8* 10.5*  HCT 31.4* 30.2*  PLT 115* 98*   BMET  Recent Labs  10/05/15 2141 10/06/15 0303  NA 136 136  K 4.1 3.8  CL 105 103  CO2 24 27  GLUCOSE 151* 125*  BUN 19 18  CREATININE 1.07 0.99  CALCIUM 7.9* 8.1*   PT/INR  Recent Labs  10/05/15 0839  LABPROT 15.2*  INR 1.18   ABG No results for input(s): PHART, HCO3 in the last 72 hours.  Invalid input(s): PCO2, PO2  Studies/Results: Dg Abd 1 View  10/05/2015  CLINICAL DATA:  Abdominal pain.  Colon cancer.  Possible sepsis. EXAM: ABDOMEN - 1 VIEW COMPARISON:  Chest radiograph of same date. FINDINGS: Portable AP upright view of the abdomen pelvis. No free intraperitoneal air. A right abdominal air-fluid level could be within the proximal small bowel or distal stomach. Mild paucity of bowel gas  otherwise. Exclusion of the pelvis. No abnormal abdominal calcifications. No appendicolith. Convex left lumbar spine curvature with advanced spondylosis. IMPRESSION: Nonspecific bowel gas pattern. Relative paucity of bowel gas with an isolated right abdominal fluid level. No free intraperitoneal air identified Electronically Signed   By: Abigail Miyamoto M.D.   On: 10/05/2015 09:45   [place] Intraoperative  10/05/2015  CLINICAL DATA:  Ultrasound was provided for use by the ordering physician, and a technical charge was applied by the performing facility.  No radiologist interpretation/professional services rendered.   Ct Abdomen Pelvis W Contrast  10/05/2015  CLINICAL DATA:  Abdominal pain since 8 o'clock last night. Vomiting and diarrhea. EXAM: CT ABDOMEN AND PELVIS WITH CONTRAST TECHNIQUE: Multidetector CT imaging of the abdomen and pelvis was performed using the standard protocol following bolus administration of intravenous contrast. CONTRAST:  1mL OMNIPAQUE IOHEXOL 300 MG/ML  SOLN COMPARISON:  None. FINDINGS: Lower chest: Mild bibasilar atelectasis. The mild right basilar calcified pleural plaque with a 4.2 x 1.5 cm nodular area adjacently which may reflect focal pleural thickening versus fibrosis. Normal heart size. Mild coronary artery atherosclerotic cyst in the LAD. Hepatobiliary: Normal liver. Cholelithiasis with gallbladder wall thickening with pericholecystic fluid and surrounding inflammatory changes . Pancreas: Normal. Spleen: Normal. Adrenals/Urinary Tract: Normal adrenal glands. Normal kidneys. Normal bladder. Stomach/Bowel: No bowel wall thickening or dilatation. No pneumatosis, pneumoperitoneum or portal venous gas. Small amount of abdominal free fluid extending into the right pericolic gutter. Vascular/Lymphatic: Normal caliber abdominal aorta with atherosclerosis. No lymphadenopathy. Other: No  fluid collection or hematoma. Musculoskeletal: No lytic or sclerotic osseous lesion. No aggressive osseous  abnormality. 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 IMPRESSION: 1. Cholelithiasis with gallbladder wall thickening with pericholecystic fluid and surrounding inflammatory changes most concerning for acute cholecystitis. 2. Mild right basilar calcified pleural plaque with a 4.2 x 1.5 cm nodular area adjacently which may reflect focal pleural thickening versus fibrosis. Recommend follow-up CT chest in 3-6 months. Electronically Signed   By: Kathreen Devoid   On: 10/05/2015 11:11   Ir Perc Cholecystostomy  10/05/2015  CLINICAL DATA:  Acute calculus cholecystitis. The patient is at high risk for immediate cholecystectomy and requires percutaneous cholecystostomy tube placement. EXAM: PERCUTANEOUS CHOLECYSTOSTOMY COMPARISON:  CT of the abdomen earlier today ANESTHESIA/SEDATION: 3.5 mg IV Versed; 125 mcg IV Fentanyl. Total Moderate Sedation Time 15 minutes. The patient's level of consciousness and physiologic status were continuously monitored during the procedure by Radiology nursing. CONTRAST:  40mL OMNIPAQUE IOHEXOL 300 MG/ML  SOLN MEDICATIONS: No additional medications. FLUOROSCOPY TIME:  30 seconds. PROCEDURE: The procedure, risks, benefits, and alternatives were explained to the patient. Questions regarding the procedure were encouraged and answered. The patient understands and consents to the procedure. A time-out was performed prior to the procedure. Ultrasound was used to localize the gallbladder. The right abdominal wall was prepped with chlorhexidine in a sterile fashion, and a sterile drape was applied covering the operative field. A sterile gown and sterile gloves were used for the procedure. Local anesthesia was provided with 1% Lidocaine. Ultrasound image documentation was performed. Fluoroscopy during the procedure and fluoro spot radiograph confirms appropriate catheter position. Under direct ultrasound guidance, a 21 gauge needle was  advanced into the gallbladder lumen. Aspiration was performed and a bile sample sent for culture studies. A small amount of diluted contrast material was injected. A guide wire was then advanced into the gallbladder. A transitional dilator was placed. Percutaneous tract dilatation was then performed over a guide wire to 10-French. A 10-French pigtail drainage catheter was then advanced into the gallbladder lumen under fluoroscopy. Catheter was formed and injected with contrast material to confirm position. The catheter was flushed and connected to a gravity drainage bag. It was secured at the skin with a Prolene retention suture and Stat-Lock device. COMPLICATIONS: None FINDINGS: A cholecystostomy tube was advanced into the gallbladder lumen and formed. It is now draining bile. This tube will be left to gravity drainage. IMPRESSION: Percutaneous cholecystostomy with placement of 10-French drainage catheter into the gallbladder lumen. This was left to gravity drainage. Electronically Signed   By: Aletta Edouard M.D.   On: 10/05/2015 16:33   Dg Chest Portable 1 View  10/05/2015  CLINICAL DATA:  Vomiting. EXAM: PORTABLE CHEST 1 VIEW COMPARISON:  None. FINDINGS: The heart size and mediastinal contours are within normal limits. Both lungs are clear. No pneumothorax or pleural effusion is noted. The visualized skeletal structures are unremarkable. IMPRESSION: No acute cardiopulmonary abnormality seen. Electronically Signed   By: Marijo Conception, M.D.   On: 10/05/2015 09:44    Anti-infectives: Anti-infectives    Start     Dose/Rate Route Frequency Ordered Stop   10/06/15 2100  cefTRIAXone (ROCEPHIN) 2 g in dextrose 5 % 50 mL IVPB     2 g 100 mL/hr over 30 Minutes Intravenous Every 24 hours 10/06/15 1504     10/05/15 1745  piperacillin-tazobactam (ZOSYN) IVPB 3.375 g     3.375 g 12.5 mL/hr over 240  Minutes Intravenous 3 times per day 10/05/15 1740 10/06/15 1859   10/05/15 0945  vancomycin (VANCOCIN) IVPB 1000  mg/200 mL premix     1,000 mg 200 mL/hr over 60 Minutes Intravenous  Once 10/05/15 0935 10/05/15 1106   10/05/15 0845  piperacillin-tazobactam (ZOSYN) IVPB 3.375 g     3.375 g 12.5 mL/hr over 240 Minutes Intravenous  Once 10/05/15 [id number] 10/05/15 0932      Assessment/Plan:  75 year old male with acute cholecystitis being treated with percutaneous physical sauce B tube and antibiotics. Patient's course has been complicated by a myocardial infarction. Agree with medicine and cardiology management. No current plans for surgery in the next several months. Patient will need to complete a 2 week course of antibiotics of which it is okay to start transitioning to oral antibiotics at this time. Surgery will continue to follow with you.  Naiyah Klostermann T. Adonis Huguenin, MD, FACS  10/07/2015

## 2015-10-07 NOTE — Progress Notes (Signed)
ANTICOAGULATION CONSULT NOTE -follow up [place] for heparin drip Indication: ACS/STEMI  No Known Allergies  Patient Measurements: Height: 6\' 2"  (188 cm) Weight: 171 lb 15.3 oz (78 kg) IBW/kg (Calculated) : 82.2 Heparin Dosing Weight: 78kg  Vital Signs: Temp: 98.4 F (36.9 C) (02/11 1209) Temp Source: Oral (02/11 1209) BP: 161/72 mmHg (02/11 1209) Pulse Rate: 121 (02/11 1209)  Labs:  Recent Labs  10/05/15 0839  10/05/15 2141 10/06/15 0303  10/06/15 1119 10/06/15 1731 10/06/15 2331 10/07/15 0229 10/07/15 1144  HGB 13.2  --   --  10.8*  --   --   --   --  10.5*  --   HCT 39.3*  --   --  31.4*  --   --   --   --  30.2*  --   PLT 238  --   --  115*  --   --   --   --  98*  --   APTT 31  --   --   --   --   --   --   --   --   --   LABPROT 15.2*  --   --   --   --   --   --   --   --   --   INR 1.18  --   --   --   --   --   --   --   --   --   HEPARINUNFRC  --   --   --   --   < >  --  0.22*  --  0.29* 0.31  CREATININE 1.51*  --  1.07 0.99  --   --   --   --   --   --   TROPONINI 0.04*  < > 13.17* 19.47*  --  13.95* 11.27* 7.56*  --   --   < > = values in this interval not displayed.  Estimated Creatinine Clearance: 72.2 mL/min (by C-G formula based on Cr of 0.99).   Medical History: Past Medical History  Diagnosis Date  . Cancer ([place])   . History of colectomy   . Hypertension   . GERD (gastroesophageal reflux disease)   . HLD (hyperlipidemia)     Medications:    Assessment: Hgb 13.2  plt 230 INR 1.18  aPTT 31 No outpatient anticoag in med rec.  Goal of Therapy:  Heparin level 0.3-0.7 units/ml Monitor platelets by anticoagulation protocol: Yes   Plan:  Heparin level is below goal so will bolus heparin 1200 units and increase infusion to 1100 units/hr. Will recheck a HL in 8 hours.   2/10:  HL @ 17:30 = 0.22 Will order Heparin 1200 units IV X 1 bolus and increase drip rate to 1250 units/hr. Will recheck HL 8 hrs after rate change on  2/11 @ 0230.   2/11 02:30 heparin level 0.29. 1200 unit bolus and increase to 1400 units/hr. Recheck in 8 hours.  2/11 Heparin level at 1144= 0.31. Will continue current rate of 1400 units/hr and check confirmation level in 8 hrs.  Jilliam Bellmore A 10/07/2015,1:57 PM

## 2015-10-07 NOTE — Progress Notes (Signed)
[facility] Bell at [facility] NAME: Preston Bell    MR#:  [id number]  DATE OF BIRTH:  [birth year]  SUBJECTIVE:   he complains of some pain at the cholecystostomy tube site. No chest pain, shortness of breath.  REVIEW OF SYSTEMS:    Review of Systems  Constitutional: Negative for fever and chills.  HENT: Negative for congestion and tinnitus.   Eyes: Negative for blurred vision and double vision.  Respiratory: Negative for cough, shortness of breath and wheezing.   Cardiovascular: Negative for chest pain, orthopnea and PND.  Gastrointestinal: Positive for abdominal pain (right upper quadrant). Negative for nausea, vomiting and diarrhea.  Genitourinary: Negative for dysuria and hematuria.  Neurological: Negative for dizziness, sensory change and focal weakness.  All other systems reviewed and are negative.   Nutrition: Heart healthy Tolerating Diet: Yes Tolerating PT: Await evaluation   DRUG ALLERGIES:  No Known Allergies  VITALS:  Blood pressure 161/72, pulse 121, temperature 98.4 F (36.9 C), temperature source Oral, resp. rate 20, height 6\' 2"  (1.88 m), weight 78 kg (171 lb 15.3 oz), SpO2 91 %.  PHYSICAL EXAMINATION:   Physical Exam  GENERAL: 75 y.o.-year-old patient lying in the bed with no acute distress.  EYES: Pupils equal, round, reactive to light and accommodation. No scleral icterus. Extraocular muscles intact.  HEENT: Head atraumatic, normocephalic. Oropharynx and nasopharynx clear.  NECK: Supple, no jugular venous distention. No thyroid enlargement, no tenderness.  LUNGS: Normal breath sounds bilaterally, no wheezing, rales, rhonchi . No use of accessory muscles of respiration.  CARDIOVASCULAR: S1, S2, RRR. No murmurs, rubs, gallops, clicks.  ABDOMEN: Soft, NT, no rebound, rigidity, nondistended. Bowel sounds present. No organomegaly or mass. + abdominal wall hernia from previous colon cancer surgery which is  reducible  + right sided percutaneous cholecystostomy tube with bilious drainage EXTREMITIES: No pedal edema, cyanosis, or clubbing.  NEUROLOGIC: Cranial nerves II through XII are intact. No focal motor or sensory deficits appreciated bilaterally  PSYCHIATRIC: The patient is alert and oriented x 3. Good affect SKIN: No obvious rash, lesion, or ulcer.    LABORATORY PANEL:   CBC  Recent Labs Lab 10/07/15 0229  WBC 9.1  HGB 10.5*  HCT 30.2*  PLT 98*   ------------------------------------------------------------------------------------------------------------------  Chemistries   Recent Labs Lab 10/06/15 0303  NA 136  K 3.8  CL 103  CO2 27  GLUCOSE 125*  BUN 18  CREATININE 0.99  CALCIUM 8.1*  MG 2.4  AST 174*  ALT 103*  ALKPHOS 58  BILITOT 2.2*   ------------------------------------------------------------------------------------------------------------------  Cardiac Enzymes  Recent Labs Lab 10/06/15 2331  TROPONINI 7.56*   ------------------------------------------------------------------------------------------------------------------  RADIOLOGY:  [place] Intraoperative  10/05/2015  CLINICAL DATA:  Ultrasound was provided for use by the ordering physician, and a technical charge was applied by the performing facility.  No radiologist interpretation/professional services rendered.   Ir Perc Cholecystostomy  10/05/2015  CLINICAL DATA:  Acute calculus cholecystitis. The patient is at high risk for immediate cholecystectomy and requires percutaneous cholecystostomy tube placement. EXAM: PERCUTANEOUS CHOLECYSTOSTOMY COMPARISON:  CT of the abdomen earlier today ANESTHESIA/SEDATION: 3.5 mg IV Versed; 125 mcg IV Fentanyl. Total Moderate Sedation Time 15 minutes. The patient's level of consciousness and physiologic status were continuously monitored during the procedure by Radiology nursing. CONTRAST:  92mL OMNIPAQUE IOHEXOL 300 MG/ML  SOLN MEDICATIONS: No additional  medications. FLUOROSCOPY TIME:  30 seconds. PROCEDURE: The procedure, risks, benefits, and alternatives were explained to the patient. Questions regarding the procedure were  encouraged and answered. The patient understands and consents to the procedure. A time-out was performed prior to the procedure. Ultrasound was used to localize the gallbladder. The right abdominal wall was prepped with chlorhexidine in a sterile fashion, and a sterile drape was applied covering the operative field. A sterile gown and sterile gloves were used for the procedure. Local anesthesia was provided with 1% Lidocaine. Ultrasound image documentation was performed. Fluoroscopy during the procedure and fluoro spot radiograph confirms appropriate catheter position. Under direct ultrasound guidance, a 21 gauge needle was advanced into the gallbladder lumen. Aspiration was performed and a bile sample sent for culture studies. A small amount of diluted contrast material was injected. A guide wire was then advanced into the gallbladder. A transitional dilator was placed. Percutaneous tract dilatation was then performed over a guide wire to 10-French. A 10-French pigtail drainage catheter was then advanced into the gallbladder lumen under fluoroscopy. Catheter was formed and injected with contrast material to confirm position. The catheter was flushed and connected to a gravity drainage bag. It was secured at the skin with a Prolene retention suture and Stat-Lock device. COMPLICATIONS: None FINDINGS: A cholecystostomy tube was advanced into the gallbladder lumen and formed. It is now draining bile. This tube will be left to gravity drainage. IMPRESSION: Percutaneous cholecystostomy with placement of 10-French drainage catheter into the gallbladder lumen. This was left to gravity drainage. Electronically Signed   By: Aletta Edouard M.D.   On: 10/05/2015 16:33     ASSESSMENT AND PLAN:   75 year old male with past medical history of colon  cancer, hypertension, GERD, anxiety, history of alcohol and tobacco abuse who presents to the hospital with right upper quadrant abdominal pain nausea vomiting.   #1 acute cholecystitis-this was the cause of patient's right upper quadrant abdominal pain nausea vomiting. -Patient is status post percutaneous cholecystostomy tube placement. -Blood cultures are positive for Klebsiella and cont. IV ceftriaxone and transition to PO abx tomorrow. Will repeat BC to make sure they are clearing.  - Patient likely will need a laparoscopic cholecystectomy but at a later time as pt. Was noted to NSTEMI  #2 NSTEMI - he has ruled in by cardiac markers as his troponin is now elevated to over 20. Clinically he still denies any chest pain. -Continue aspirin, heparin drip, beta blocker, statin. -Echocardiogram showing normal EF of 55 to 60%, appreciate cardiology input and Cardiac Cath on Monday.   #3 sepsis-due to acute cholecystitis. -Afebrile, hemodynamically stable. Blood cultures positive for Klebsiella.  - cont. IV Ceftriaxone and will switch to Oral abx in a.m. Will repeat BC to make sure they are clearing.  #4 SVT/sinus tachycardia-patient apparently had SVT with heart rates in the 160s on admission but that has significantly improved with some as needed Cardizem/Adenosine and IV fluids and treatment for underlying sepsis. -Continue metoprolol.  #5. Essential hypertension - cont. Metoprolol, Lisinopril  #6. Hyperlipidemia - cont. Atorvastatin  #7. GERD - cont. Protonix.    All the records are reviewed and case discussed with Care Management/Social Workerr. Management plans discussed with the patient, family and they are in agreement.  CODE STATUS: Full  DVT Prophylaxis: Heparin drip  TOTAL TIME TAKING CARE OF THIS PATIENT: 25 minutes.   POSSIBLE D/C IN 1-2 DAYS, DEPENDING ON CLINICAL CONDITION.   Henreitta Leber M.D on 10/07/2015 at 3:33 PM  Between 7am to 6pm - Pager -  [phone]  After 6pm go to [email or web address] - password [facility]  Office  [phone]  CC: Primary care physician; Pcp Not In System

## 2015-10-07 NOTE — Progress Notes (Signed)
ANTICOAGULATION CONSULT NOTE -follow up [place] for heparin drip Indication: ACS/STEMI  No Known Allergies  Patient Measurements: Height: 6\' 2"  (188 cm) Weight: 171 lb 15.3 oz (78 kg) IBW/kg (Calculated) : 82.2 Heparin Dosing Weight: 78kg  Vital Signs: Temp: 99 F (37.2 C) (02/11 2022) Temp Source: Oral (02/11 2022) BP: 131/68 mmHg (02/11 2022) Pulse Rate: 102 (02/11 2022)  Labs:  Recent Labs  10/05/15 0839  10/05/15 2141 10/06/15 0303  10/06/15 1119 10/06/15 1731 10/06/15 2331 10/07/15 0229 10/07/15 1144 10/07/15 2021  HGB 13.2  --   --  10.8*  --   --   --   --  10.5*  --   --   HCT 39.3*  --   --  31.4*  --   --   --   --  30.2*  --   --   PLT 238  --   --  115*  --   --   --   --  98*  --   --   APTT 31  --   --   --   --   --   --   --   --   --   --   LABPROT 15.2*  --   --   --   --   --   --   --   --   --   --   INR 1.18  --   --   --   --   --   --   --   --   --   --   HEPARINUNFRC  --   --   --   --   < >  --  0.22*  --  0.29* 0.31 0.30  CREATININE 1.51*  --  1.07 0.99  --   --   --   --   --   --   --   TROPONINI 0.04*  < > 13.17* 19.47*  --  13.95* 11.27* 7.56*  --   --   --   < > = values in this interval not displayed.  Estimated Creatinine Clearance: 72.2 mL/min (by C-G formula based on Cr of 0.99).   Medical History: Past Medical History  Diagnosis Date  . Cancer ([place])   . History of colectomy   . Hypertension   . GERD (gastroesophageal reflux disease)   . HLD (hyperlipidemia)     Medications:    Assessment: Hgb 13.2  plt 230 INR 1.18  aPTT 31 No outpatient anticoag in med rec.  Goal of Therapy:  Heparin level 0.3-0.7 units/ml Monitor platelets by anticoagulation protocol: Yes   Plan:  Heparin level is below goal so will bolus heparin 1200 units and increase infusion to 1100 units/hr. Will recheck a HL in 8 hours.   2/10:  HL @ 17:30 = 0.22 Will order Heparin 1200 units IV X 1 bolus and increase drip rate to  1250 units/hr. Will recheck HL 8 hrs after rate change on 2/11 @ 0230.   2/11 02:30 heparin level 0.29. 1200 unit bolus and increase to 1400 units/hr. Recheck in 8 hours.  2/11 Heparin level at 1144= 0.31. Will continue current rate of 1400 units/hr and check confirmation level in 8 hrs.   2/11 21:31 Heparin level resulted at 0.30. Will continue with current rate and check next level with am labs.  Paulina Fusi, PharmD, BCPS 10/07/2015 9:10 PM

## 2015-10-08 DIAGNOSIS — R7989 Other specified abnormal findings of blood chemistry: Secondary | ICD-10-CM

## 2015-10-08 LAB — CBC
HCT: 27.1 % — ABNORMAL LOW (ref 40.0–52.0)
HEMOGLOBIN: 9.4 g/dL — AB (ref 13.0–18.0)
MCH: 32.8 pg (ref 26.0–34.0)
MCHC: 34.7 g/dL (ref 32.0–36.0)
MCV: 94.4 fL (ref 80.0–100.0)
Platelets: 103 10*3/uL — ABNORMAL LOW (ref 150–440)
RBC: 2.87 MIL/uL — AB (ref 4.40–5.90)
RDW: 13.8 % (ref 11.5–14.5)
WBC: 7.6 10*3/uL (ref 3.8–10.6)

## 2015-10-08 LAB — HEPARIN LEVEL (UNFRACTIONATED)
Heparin Unfractionated: 0.28 IU/mL — ABNORMAL LOW (ref 0.30–0.70)
Heparin Unfractionated: 0.4 IU/mL (ref 0.30–0.70)

## 2015-10-08 MED ORDER — SODIUM CHLORIDE 0.9 % WEIGHT BASED INFUSION
1.0000 mL/kg/h | INTRAVENOUS | Status: DC
  Administered 2015-10-09: 1 mL/kg/h via INTRAVENOUS

## 2015-10-08 MED ORDER — HEPARIN BOLUS VIA INFUSION
1200.0000 [IU] | Freq: Once | INTRAVENOUS | Status: AC
  Administered 2015-10-08: 1200 [IU] via INTRAVENOUS
  Filled 2015-10-08: qty 1200

## 2015-10-08 MED ORDER — ASPIRIN 81 MG PO CHEW
81.0000 mg | CHEWABLE_TABLET | ORAL | Status: AC
  Administered 2015-10-09: 81 mg via ORAL
  Filled 2015-10-08: qty 1

## 2015-10-08 MED ORDER — SODIUM CHLORIDE 0.9 % WEIGHT BASED INFUSION: 3.0000 mL/kg/h | INTRAVENOUS | Status: AC

## 2015-10-08 NOTE — Progress Notes (Signed)
ANTICOAGULATION CONSULT NOTE -follow up [place] for heparin drip Indication: ACS/STEMI  No Known Allergies  Patient Measurements: Height: 6\' 2"  (188 cm) Weight: 171 lb 15.3 oz (78 kg) IBW/kg (Calculated) : 82.2 Heparin Dosing Weight: 78kg  Vital Signs: Temp: 98.4 F (36.9 C) (02/12 1951) Temp Source: Oral (02/12 1951) BP: 152/76 mmHg (02/12 1952) Pulse Rate: 102 (02/12 1952)  Labs:  Recent Labs  10/05/15 2141  10/06/15 0303  10/06/15 1119 10/06/15 1731 10/06/15 2331 10/07/15 0229  10/07/15 2021 10/08/15 0635 10/08/15 1654  HGB  --   < > 10.8*  --   --   --   --  10.5*  --   --  9.4*  --   HCT  --   --  31.4*  --   --   --   --  30.2*  --   --  27.1*  --   PLT  --   --  115*  --   --   --   --  98*  --   --  103*  --   HEPARINUNFRC  --   --   --   < >  --  0.22*  --  0.29*  < > 0.30 0.28* 0.40  CREATININE 1.07  --  0.99  --   --   --   --   --   --   --   --   --   TROPONINI 13.17*  --  19.47*  --  13.95* 11.27* 7.56*  --   --   --   --   --   < > = values in this interval not displayed.  Estimated Creatinine Clearance: 72.2 mL/min (by C-G formula based on Cr of 0.99).   Medical History: Past Medical History  Diagnosis Date  . Cancer ([place])   . History of colectomy   . Hypertension   . GERD (gastroesophageal reflux disease)   . HLD (hyperlipidemia)     Medications:    Assessment: Hgb 13.2  plt 230 INR 1.18  aPTT 31 No outpatient anticoag in med rec.  Goal of Therapy:  Heparin level 0.3-0.7 units/ml Monitor platelets by anticoagulation protocol: Yes   Plan:  Heparin level is below goal so will bolus heparin 1200 units and increase infusion to 1100 units/hr. Will recheck a HL in 8 hours.   2/10:  HL @ 17:30 = 0.22 Will order Heparin 1200 units IV X 1 bolus and increase drip rate to 1250 units/hr. Will recheck HL 8 hrs after rate change on 2/11 @ 0230.   2/11 02:30 heparin level 0.29. 1200 unit bolus and increase to 1400 units/hr.  Recheck in 8 hours.  2/11 Heparin level at 1144= 0.31. Will continue current rate of 1400 units/hr and check confirmation level in 8 hrs.   2/11 21:31 Heparin level resulted at 0.30. Will continue with current rate and check next level with am labs.  2/12 Heparin level at 0635=0.28. Will give 1200 unit bolus and increase drip to 1550 units/hr. REcheck Heparin level in 8 hrs.  2/12 1700 Heparin level resulted at 0.4. Will continue with current rate and check a confirmation level in 6hr.  Paulina Fusi, PharmD, BCPS 10/08/2015 8:07 PM

## 2015-10-08 NOTE — Progress Notes (Signed)
Patient: Preston Bell / Admit Date: 10/05/2015 / Date of Encounter: 10/08/2015, 1:40 PM   Subjective: Feels well overnight, and this morning, belching improved Denies abdominal discomfort Had a very small bowel movement Denies chest pain, on heparin infusion, has not ambulated Still on IV fluids at 75 cc per hour  Review of Systems: Review of Systems  Constitutional: Negative.   Respiratory: Negative.   Cardiovascular: Negative.   Gastrointestinal: Positive for abdominal pain.  Musculoskeletal: Negative.   Neurological: Negative.   Psychiatric/Behavioral: Negative.   All other systems reviewed and are negative.   Objective: Telemetry: NSR, no significant arrhythmia Physical Exam: Blood pressure 141/78, pulse 111, temperature 98.2 F (36.8 C), temperature source Oral, resp. rate 28, height 6\' 2"  (1.88 m), weight 171 lb 15.3 oz (78 kg), SpO2 92 %. Body mass index is 22.07 kg/(m^2). General: Well developed, well nourished, in no acute distress. Head: Normocephalic, atraumatic, sclera non-icteric, no xanthomas, nares are without discharge. Neck: Negative for carotid bruits. JVP not elevated. Lungs: Clear bilaterally to auscultation without wheezes, rales, or rhonchi. Breathing is unlabored. Heart: RRR S1 S2 without murmurs, rubs, or gallops.  Abdomen: Soft, non-tender, non-distended with normoactive bowel sounds.  Extremities: No clubbing or cyanosis. No edema. Distal pedal pulses are 2+ and equal bilaterally. Neuro: Alert and oriented X 3. Moves all extremities spontaneously. Psych:  Responds to questions appropriately with a normal affect.   Intake/Output Summary (Last 24 hours) at 10/08/15 1340 Last data filed at 10/08/15 1200  Gross per 24 hour  Intake 2321.59 ml  Output    805 ml  Net 1516.59 ml    Inpatient Medications:  . aspirin EC  81 mg Oral Daily  . atorvastatin  40 mg Oral Daily  . cefTRIAXone (ROCEPHIN)  IV  2 g Intravenous Q24H  . lisinopril  20 mg  Oral Daily  . metoprolol succinate  100 mg Oral Daily  . mirtazapine  15 mg Oral QHS  . pantoprazole (PROTONIX) IV  40 mg Intravenous QHS   Infusions:  . sodium chloride 75 mL/hr at 10/08/15 0152  . heparin 1,550 Units/hr (10/08/15 0813)    Labs:  Recent Labs  10/05/15 2141 10/06/15 0303  NA 136 136  K 4.1 3.8  CL 105 103  CO2 24 27  GLUCOSE 151* 125*  BUN 19 18  CREATININE 1.07 0.99  CALCIUM 7.9* 8.1*  MG 1.1* 2.4  PHOS  --  2.7    Recent Labs  10/06/15 0303  AST 174*  ALT 103*  ALKPHOS 58  BILITOT 2.2*  PROT 5.9*  ALBUMIN 3.2*    Recent Labs  10/07/15 0229 10/08/15 0635  WBC 9.1 7.6  HGB 10.5* 9.4*  HCT 30.2* 27.1*  MCV 93.6 94.4  PLT 98* 103*    Recent Labs  10/06/15 0303 10/06/15 1119 10/06/15 1731 10/06/15 2331  TROPONINI 19.47* 13.95* 11.27* 7.56*   Invalid input(s): POCBNP No results for input(s): HGBA1C in the last 72 hours.   Weights: Filed Weights   10/05/15 0821 10/05/15 0952 10/05/15 1700  Weight: 170 lb (77.111 kg) 180 lb (81.647 kg) 171 lb 15.3 oz (78 kg)     Radiology/Studies:  Dg Abd 1 View  10/05/2015  CLINICAL DATA:  . IMPRESSION: Nonspecific bowel gas pattern. Relative paucity of bowel gas with an isolated right abdominal fluid level. No free intraperitoneal air identified Electronically Signed   By: Abigail Miyamoto M.D.   On: 10/05/2015 09:45   [place] Intraoperative  10/05/2015  CLINICAL DATA:  Ultrasound was provided for use by the ordering physician, and a technical charge was applied by the performing facility.  No radiologist interpretation/professional services rendered.   Ct Abdomen Pelvis W Contrast  10/05/2015  CLINICAL DATA:   IMPRESSION: 1. Cholelithiasis with gallbladder wall thickening with pericholecystic fluid and surrounding inflammatory changes most concerning for acute cholecystitis. 2. Mild right basilar calcified pleural plaque with a 4.2 x 1.5 cm nodular area adjacently which may reflect focal pleural thickening  versus fibrosis. Recommend follow-up CT chest in 3-6 months. Electronically Signed   By: Kathreen Devoid   On: 10/05/2015 11:11   Ir Perc Cholecystostomy  10/05/2015  CLINICAL DATA:   IMPRESSION: Percutaneous cholecystostomy with placement of 10-French drainage catheter into the gallbladder lumen. This was left to gravity drainage. Electronically Signed   By: Aletta Edouard M.D.   On: 10/05/2015 16:33   Dg Chest Portable 1 View  10/05/2015  CLINICAL DATA:  V IMPRESSION: No acute cardiopulmonary abnormality seen. Electronically Signed   By: Marijo Conception, M.D.   On: 10/05/2015 09:44     Assessment and Plan  75 y.o. male    1. Elevated Troponin troponin 19.47 at peak consistent with NSTEMI  cardiac risk factors including HTN, HLD, and a history of tobacco use.  On  Heparin infusion, will discontinue in the a.m. prior to catheterization cardiac catheterization scheduled for tomorrow a.m. first case  2. HTN - continue current treatment Ranging from [id number] up to [id number] systolic on average  3. HLD - continue statin therapy, on Lipitor 40 mg daily  4. Acute Cholecystitis/ Sepsis  - presented with nausea, vomiting, and diarrhea. - Lactic Acid 8.9, WBC 23.1 upon arrival to the ED. WBC improved to 11.3 and Lactic Acid at 3.2 on recent lab draws. Tachycardic into the 150's upon arrival with questionable episodes of SVT.  - CT of the abdomen showed acute cholecystitis.   current high-surgical risk.  IR was consulted and a percutaneous cholecystostomy with placement of a drainage catheter was placed into the gallbladder lumen.   Risk and benefit of the procedure discussed with the patient's, He is aware of risk of heart attack and stroke. Recovery time discussed with him including need for possible stenting and additional hospital stay. Orders will be placed for procedure in the morning   Total encounter time more than 35 minutes  Greater than 50% was spent in counseling and coordination of care  with the patient   Signed, Esmond Plants, MD, Ph.D. [facility] 10/08/2015, 1:40 PM

## 2015-10-08 NOTE — Progress Notes (Signed)
CC: Cholecystitis Subjective: Patient reports his abdominal pain is basically gone. Only complaint is of soreness from the insertion site of the drain. Has been tolerating his diet without any nausea, vomiting, abdominal pain.  Objective: Vital signs in last 24 hours: Temp:  [98.4 F (36.9 C)-99 F (37.2 C)] 98.5 F (36.9 C) (02/12 0800) Pulse Rate:  [102-121] 108 (02/12 0800) Resp:  [20-23] 20 (02/12 0800) BP: (123-161)/(67-72) 144/67 mmHg (02/12 0800) SpO2:  [91 %-94 %] 94 % (02/12 0800) Last BM Date: 10/07/15  Intake/Output from previous day: 02/11 0701 - 02/12 0700 In: 2315.3 [I.V.:2255.3; IV Piggyback:50] Out: [id number] [Urine:1350; Drains:130] Intake/Output this shift:    Physical exam:  Gen.: No acute distress Chest: Clear to auscultation Heart: Tachycardic Abdomen: Soft, nontender, nondistended. Minimal discomfort to palpation at the right upper quadrant drain site. No evidence of erythema or purulent drainage. Drain is draining a bilious fluid.  Lab Results: CBC   Recent Labs  10/07/15 0229 10/08/15 0635  WBC 9.1 7.6  HGB 10.5* 9.4*  HCT 30.2* 27.1*  PLT 98* 103*   BMET  Recent Labs  10/05/15 2141 10/06/15 0303  NA 136 136  K 4.1 3.8  CL 105 103  CO2 24 27  GLUCOSE 151* 125*  BUN 19 18  CREATININE 1.07 0.99  CALCIUM 7.9* 8.1*   PT/INR No results for input(s): LABPROT, INR in the last 72 hours. ABG No results for input(s): PHART, HCO3 in the last 72 hours.  Invalid input(s): PCO2, PO2  Studies/Results: No results found.  Anti-infectives: Anti-infectives    Start     Dose/Rate Route Frequency Ordered Stop   10/06/15 2100  cefTRIAXone (ROCEPHIN) 2 g in dextrose 5 % 50 mL IVPB     2 g 100 mL/hr over 30 Minutes Intravenous Every 24 hours 10/06/15 1504     10/05/15 1745  piperacillin-tazobactam (ZOSYN) IVPB 3.375 g     3.375 g 12.5 mL/hr over 240 Minutes Intravenous 3 times per day 10/05/15 1740 10/06/15 1859   10/05/15 0945  vancomycin  (VANCOCIN) IVPB 1000 mg/200 mL premix     1,000 mg 200 mL/hr over 60 Minutes Intravenous  Once 10/05/15 0935 10/05/15 1106   10/05/15 0845  piperacillin-tazobactam (ZOSYN) IVPB 3.375 g     3.375 g 12.5 mL/hr over 240 Minutes Intravenous  Once 10/05/15 [id number] 10/05/15 0932      Assessment/Plan:  75 year old male admitted for acute cholecystitis who also had an NSTEMI. No plans for surgical intervention any time soon given his recent myocardial infarction. Per the patient that are planning on a cardiac catheterization tomorrow. Okay from a surgery standpoint transition to oral antibiotics for him to complete a 2 week course of antibiotics for his cholecystitis. Surgery will continue to follow until discharge after which she will need surgical follow up in clinic.Marland Kitchen  Zacariah Belue T. Adonis Huguenin, MD, FACS  10/08/2015

## 2015-10-08 NOTE — Progress Notes (Signed)
ANTICOAGULATION CONSULT NOTE -follow up [facility] for heparin drip Indication: ACS/STEMI  No Known Allergies  Patient Measurements: Height: 6\' 2"  (188 cm) Weight: 171 lb 15.3 oz (78 kg) IBW/kg (Calculated) : 82.2 Heparin Dosing Weight: 78kg  Vital Signs: Temp: 98.7 F (37.1 C) (02/12 0426) Temp Source: Oral (02/12 0426) BP: 123/67 mmHg (02/12 0426) Pulse Rate: 106 (02/12 0426)  Labs:  Recent Labs  10/05/15 0839  10/05/15 2141 10/06/15 0303  10/06/15 1119 10/06/15 1731 10/06/15 2331 10/07/15 0229 10/07/15 1144 10/07/15 2021 10/08/15 0635  HGB 13.2  --   --  10.8*  --   --   --   --  10.5*  --   --  9.4*  HCT 39.3*  --   --  31.4*  --   --   --   --  30.2*  --   --  27.1*  PLT 238  --   --  115*  --   --   --   --  98*  --   --  103*  APTT 31  --   --   --   --   --   --   --   --   --   --   --   LABPROT 15.2*  --   --   --   --   --   --   --   --   --   --   --   INR 1.18  --   --   --   --   --   --   --   --   --   --   --   HEPARINUNFRC  --   --   --   --   < >  --  0.22*  --  0.29* 0.31 0.30 0.28*  CREATININE 1.51*  --  1.07 0.99  --   --   --   --   --   --   --   --   TROPONINI 0.04*  < > 13.17* 19.47*  --  13.95* 11.27* 7.56*  --   --   --   --   < > = values in this interval not displayed.  Estimated Creatinine Clearance: 72.2 mL/min (by C-G formula based on Cr of 0.99).   Medical History: Past Medical History  Diagnosis Date  . Cancer ([place])   . History of colectomy   . Hypertension   . GERD (gastroesophageal reflux disease)   . HLD (hyperlipidemia)     Medications:    Assessment: Hgb 13.2  plt 230 INR 1.18  aPTT 31 No outpatient anticoag in med rec.  Goal of Therapy:  Heparin level 0.3-0.7 units/ml Monitor platelets by anticoagulation protocol: Yes   Plan:  Heparin level is below goal so will bolus heparin 1200 units and increase infusion to 1100 units/hr. Will recheck a HL in 8 hours.   2/10:  HL @ 17:30 = 0.22 Will  order Heparin 1200 units IV X 1 bolus and increase drip rate to 1250 units/hr. Will recheck HL 8 hrs after rate change on 2/11 @ 0230.   2/11 02:30 heparin level 0.29. 1200 unit bolus and increase to 1400 units/hr. Recheck in 8 hours.  2/11 Heparin level at 1144= 0.31. Will continue current rate of 1400 units/hr and check confirmation level in 8 hrs.   2/11 21:31 Heparin level resulted at 0.30. Will continue with current rate and check  next level with am labs.  2/12 Heparin level at 0635=0.28. Will give 1200 unit bolus and increase drip to 1550 units/hr. REcheck Heparin level in 8 hrs.  Chinita Greenland PharmD Clinical Pharmacist 10/08/2015 8:06 AM

## 2015-10-08 NOTE — Progress Notes (Signed)
[facility] [place] NAME: Preston Bell    MR#:  [id number]  DATE OF BIRTH:  May 25, 1941  SUBJECTIVE:   he still complains of some pain at the cholecystostomy tube site. No chest pain, shortness of breath.  Afebrile.    REVIEW OF SYSTEMS:    Review of Systems  Constitutional: Negative for fever and chills.  HENT: Negative for congestion and tinnitus.   Eyes: Negative for blurred vision and double vision.  Respiratory: Negative for cough, shortness of breath and wheezing.   Cardiovascular: Negative for chest pain, orthopnea and PND.  Gastrointestinal: Positive for abdominal pain (near cholecystostomy site.  ). Negative for nausea, vomiting and diarrhea.  Genitourinary: Negative for dysuria and hematuria.  Neurological: Negative for dizziness, sensory change and focal weakness.  All other systems reviewed and are negative.   Nutrition: Heart healthy Tolerating Diet: Yes Tolerating PT: Await evaluation   DRUG ALLERGIES:  No Known Allergies  VITALS:  Blood pressure 141/78, pulse 111, temperature 98.2 F (36.8 C), temperature source Oral, resp. rate 28, height 6\' 2"  (1.88 m), weight 78 kg (171 lb 15.3 oz), SpO2 92 %.  PHYSICAL EXAMINATION:   Physical Exam  GENERAL: 75 y.o.-year-old patient lying in the bed with no acute distress.  EYES: Pupils equal, round, reactive to light and accommodation. No scleral icterus. Extraocular muscles intact.  HEENT: Head atraumatic, normocephalic. Oropharynx and nasopharynx clear.  NECK: Supple, no jugular venous distention. No thyroid enlargement, no tenderness.  LUNGS: Normal breath sounds bilaterally, no wheezing, rales, rhonchi . No use of accessory muscles of respiration.  CARDIOVASCULAR: S1, S2, RRR. No murmurs, rubs, gallops, clicks.  ABDOMEN: Soft, NT, no rebound, rigidity, nondistended. Bowel sounds present. No organomegaly or mass. + abdominal wall hernia from previous colon  cancer surgery which is reducible  + right sided percutaneous cholecystostomy tube with bilious drainage EXTREMITIES: No pedal edema, cyanosis, or clubbing.  NEUROLOGIC: Cranial nerves II through XII are intact. No focal motor or sensory deficits appreciated bilaterally  PSYCHIATRIC: The patient is alert and oriented x 3. Good affect SKIN: No obvious rash, lesion, or ulcer.    LABORATORY PANEL:   CBC  Recent Labs Lab 10/08/15 0635  WBC 7.6  HGB 9.4*  HCT 27.1*  PLT 103*   ------------------------------------------------------------------------------------------------------------------  Chemistries   Recent Labs Lab 10/06/15 0303  NA 136  K 3.8  CL 103  CO2 27  GLUCOSE 125*  BUN 18  CREATININE 0.99  CALCIUM 8.1*  MG 2.4  AST 174*  ALT 103*  ALKPHOS 58  BILITOT 2.2*   ------------------------------------------------------------------------------------------------------------------  Cardiac Enzymes  Recent Labs Lab 10/06/15 2331  TROPONINI 7.56*   ------------------------------------------------------------------------------------------------------------------  RADIOLOGY:  No results found.   ASSESSMENT AND PLAN:   75 year old male with past medical history of colon cancer, hypertension, GERD, anxiety, history of alcohol and tobacco abuse who presents to the hospital with right upper quadrant abdominal pain nausea vomiting.   #1 acute cholecystitis-this was the cause of patient's right upper quadrant abdominal pain nausea vomiting. -Patient is status post percutaneous cholecystostomy tube placement. -Blood cultures are positive for Klebsiella and cont. IV ceftriaxone and transition to PO abx after cardiac cath tomorrow.  repeat BC (-) so far.   - Patient likely will need a laparoscopic cholecystectomy but at a later time as pt. Was noted to NSTEMI  #2 NSTEMI - he has ruled in by cardiac markers as his troponin is now elevated to over 20. Clinically he  still denies any chest pain. -Continue aspirin, heparin drip, beta blocker, statin. -Echocardiogram showing normal EF of 55 to 60%, appreciate cardiology input and going for Cardiac Cath tomorrow.   #3 sepsis-due to acute cholecystitis. -Afebrile, hemodynamically stable. Blood cultures positive for Klebsiella. Repeat Cultures (-).  - cont. IV Ceftriaxone and will switch to Oral abx in a.m. After cardiac cath.   #4 SVT/sinus tachycardia-patient apparently had SVT with heart rates in the 160s on admission but that has resolved with some as needed Cardizem/Adenosine and IV fluids and treatment for underlying sepsis. -Continue metoprolol.  #5. Essential hypertension - cont. Metoprolol, Lisinopril  #6. Hyperlipidemia - cont. Atorvastatin  #7. GERD - cont. Protonix.    All the records are reviewed and case discussed with Care Management/Social Workerr. Management plans discussed with the patient, family and they are in agreement.  CODE STATUS: Full  DVT Prophylaxis: Heparin drip  TOTAL TIME TAKING CARE OF THIS PATIENT: 25 minutes.   POSSIBLE D/C IN 1-2 DAYS, DEPENDING ON CLINICAL CONDITION.   Henreitta Leber M.D on 10/08/2015 at 1:51 PM  Between 7am to 6pm - Pager - [phone]  After 6pm go to [email or web address] - password [email or web address] [facility]  Office  [phone]  CC: Primary care physician; Pcp Not In System

## 2015-10-09 ENCOUNTER — Inpatient Hospital Stay (HOSPITAL_COMMUNITY)
Admission: AD | Admit: 2015-10-09 | Discharge: 2015-10-19 | DRG: 235 | Disposition: A | Discharge status: Home Health | Payer: Medicare Other | Source: Other Acute Inpatient Hospital | Attending: Cardiothoracic Surgery | Admitting: Cardiothoracic Surgery

## 2015-10-09 ENCOUNTER — Encounter
Admission: EM | Disposition: A | Discharge status: Other Acute Inpatient Hospital | Payer: Self-pay | Source: Home / Self Care | Attending: Specialist

## 2015-10-09 ENCOUNTER — Encounter: Payer: Self-pay | Admitting: Cardiovascular Disease

## 2015-10-09 ENCOUNTER — Other Ambulatory Visit: Payer: Self-pay | Admitting: *Deleted

## 2015-10-09 DIAGNOSIS — B961 Klebsiella pneumoniae [K. pneumoniae] as the cause of diseases classified elsewhere: Secondary | ICD-10-CM | POA: Diagnosis not present

## 2015-10-09 DIAGNOSIS — E119 Type 2 diabetes mellitus without complications: Secondary | ICD-10-CM | POA: Diagnosis present

## 2015-10-09 DIAGNOSIS — Z6821 Body mass index (BMI) 21.0-21.9, adult: Secondary | ICD-10-CM | POA: Diagnosis not present

## 2015-10-09 DIAGNOSIS — I10 Essential (primary) hypertension: Secondary | ICD-10-CM | POA: Diagnosis not present

## 2015-10-09 DIAGNOSIS — A419 Sepsis, unspecified organism: Secondary | ICD-10-CM | POA: Diagnosis present

## 2015-10-09 DIAGNOSIS — E876 Hypokalemia: Secondary | ICD-10-CM | POA: Diagnosis present

## 2015-10-09 DIAGNOSIS — I5031 Acute diastolic (congestive) heart failure: Secondary | ICD-10-CM | POA: Diagnosis present

## 2015-10-09 DIAGNOSIS — K439 Ventral hernia without obstruction or gangrene: Secondary | ICD-10-CM | POA: Diagnosis present

## 2015-10-09 DIAGNOSIS — K8042 Calculus of bile duct with acute cholecystitis without obstruction: Secondary | ICD-10-CM | POA: Diagnosis present

## 2015-10-09 DIAGNOSIS — I2510 Atherosclerotic heart disease of native coronary artery without angina pectoris: Secondary | ICD-10-CM

## 2015-10-09 DIAGNOSIS — K219 Gastro-esophageal reflux disease without esophagitis: Secondary | ICD-10-CM | POA: Diagnosis present

## 2015-10-09 DIAGNOSIS — K819 Cholecystitis, unspecified: Secondary | ICD-10-CM | POA: Diagnosis not present

## 2015-10-09 DIAGNOSIS — R634 Abnormal weight loss: Secondary | ICD-10-CM | POA: Diagnosis present

## 2015-10-09 DIAGNOSIS — R7881 Bacteremia: Secondary | ICD-10-CM | POA: Diagnosis present

## 2015-10-09 DIAGNOSIS — Z9049 Acquired absence of other specified parts of digestive tract: Secondary | ICD-10-CM | POA: Diagnosis not present

## 2015-10-09 DIAGNOSIS — I480 Paroxysmal atrial fibrillation: Secondary | ICD-10-CM | POA: Diagnosis not present

## 2015-10-09 DIAGNOSIS — R197 Diarrhea, unspecified: Secondary | ICD-10-CM

## 2015-10-09 DIAGNOSIS — K859 Acute pancreatitis without necrosis or infection, unspecified: Secondary | ICD-10-CM | POA: Diagnosis present

## 2015-10-09 DIAGNOSIS — D649 Anemia, unspecified: Secondary | ICD-10-CM

## 2015-10-09 DIAGNOSIS — Z7982 Long term (current) use of aspirin: Secondary | ICD-10-CM

## 2015-10-09 DIAGNOSIS — R000 Tachycardia, unspecified: Secondary | ICD-10-CM

## 2015-10-09 DIAGNOSIS — R0602 Shortness of breath: Secondary | ICD-10-CM

## 2015-10-09 DIAGNOSIS — Z951 Presence of aortocoronary bypass graft: Secondary | ICD-10-CM

## 2015-10-09 DIAGNOSIS — B9689 Other specified bacterial agents as the cause of diseases classified elsewhere: Secondary | ICD-10-CM | POA: Diagnosis present

## 2015-10-09 DIAGNOSIS — Z87891 Personal history of nicotine dependence: Secondary | ICD-10-CM

## 2015-10-09 DIAGNOSIS — R1110 Vomiting, unspecified: Secondary | ICD-10-CM | POA: Diagnosis present

## 2015-10-09 DIAGNOSIS — R079 Chest pain, unspecified: Secondary | ICD-10-CM | POA: Diagnosis present

## 2015-10-09 DIAGNOSIS — Z85038 Personal history of other malignant neoplasm of large intestine: Secondary | ICD-10-CM

## 2015-10-09 DIAGNOSIS — I110 Hypertensive heart disease with heart failure: Secondary | ICD-10-CM | POA: Diagnosis present

## 2015-10-09 DIAGNOSIS — I214 Non-ST elevation (NSTEMI) myocardial infarction: Secondary | ICD-10-CM | POA: Diagnosis present

## 2015-10-09 DIAGNOSIS — I5032 Chronic diastolic (congestive) heart failure: Secondary | ICD-10-CM | POA: Diagnosis present

## 2015-10-09 DIAGNOSIS — I25110 Atherosclerotic heart disease of native coronary artery with unstable angina pectoris: Secondary | ICD-10-CM | POA: Diagnosis not present

## 2015-10-09 DIAGNOSIS — J189 Pneumonia, unspecified organism: Secondary | ICD-10-CM | POA: Diagnosis present

## 2015-10-09 DIAGNOSIS — A4189 Other specified sepsis: Secondary | ICD-10-CM | POA: Diagnosis not present

## 2015-10-09 DIAGNOSIS — J90 Pleural effusion, not elsewhere classified: Secondary | ICD-10-CM

## 2015-10-09 DIAGNOSIS — I471 Supraventricular tachycardia: Secondary | ICD-10-CM | POA: Diagnosis present

## 2015-10-09 DIAGNOSIS — J181 Lobar pneumonia, unspecified organism: Secondary | ICD-10-CM

## 2015-10-09 DIAGNOSIS — Z923 Personal history of irradiation: Secondary | ICD-10-CM | POA: Diagnosis not present

## 2015-10-09 DIAGNOSIS — E785 Hyperlipidemia, unspecified: Secondary | ICD-10-CM | POA: Diagnosis present

## 2015-10-09 DIAGNOSIS — K8050 Calculus of bile duct without cholangitis or cholecystitis without obstruction: Secondary | ICD-10-CM

## 2015-10-09 HISTORY — PX: CARDIAC CATHETERIZATION: SHX172

## 2015-10-09 HISTORY — DX: Non-ST elevation (NSTEMI) myocardial infarction: I21.4

## 2015-10-09 HISTORY — DX: Atherosclerotic heart disease of native coronary artery without angina pectoris: I25.10

## 2015-10-09 LAB — CBC
HEMATOCRIT: 26 % — AB (ref 40.0–52.0)
HEMOGLOBIN: 9.1 g/dL — AB (ref 13.0–18.0)
MCH: 32.6 pg (ref 26.0–34.0)
MCHC: 35 g/dL (ref 32.0–36.0)
MCV: 93.1 fL (ref 80.0–100.0)
Platelets: 111 10*3/uL — ABNORMAL LOW (ref 150–440)
RBC: 2.79 MIL/uL — ABNORMAL LOW (ref 4.40–5.90)
RDW: 14 % (ref 11.5–14.5)
WBC: 6.9 10*3/uL (ref 3.8–10.6)

## 2015-10-09 LAB — URINALYSIS, ROUTINE W REFLEX MICROSCOPIC
Bilirubin Urine: NEGATIVE
Glucose, UA: NEGATIVE mg/dL
Hgb urine dipstick: NEGATIVE
Ketones, ur: NEGATIVE mg/dL
Leukocytes, UA: NEGATIVE
Nitrite: NEGATIVE
Protein, ur: NEGATIVE mg/dL
Specific Gravity, Urine: 1.017 (ref 1.005–1.030)
pH: 7.5 (ref 5.0–8.0)

## 2015-10-09 LAB — HEPARIN LEVEL (UNFRACTIONATED): HEPARIN UNFRACTIONATED: 0.32 [IU]/mL (ref 0.30–0.70)

## 2015-10-09 LAB — GRAM STAIN

## 2015-10-09 LAB — BASIC METABOLIC PANEL
ANION GAP: 7 (ref 5–15)
BUN: 14 mg/dL (ref 6–20)
CHLORIDE: 108 mmol/L (ref 101–111)
CO2: 22 mmol/L (ref 22–32)
Calcium: 7.8 mg/dL — ABNORMAL LOW (ref 8.9–10.3)
Creatinine, Ser: 0.86 mg/dL (ref 0.61–1.24)
GFR calc Af Amer: >60 mL/min (ref >60)
GFR calc non Af Amer: >60 mL/min (ref >60)
GLUCOSE: 89 mg/dL (ref 65–99)
POTASSIUM: 2.9 mmol/L — AB (ref 3.5–5.1)
Sodium: 137 mmol/L (ref 135–145)

## 2015-10-09 LAB — PROTIME-INR
INR: 1.29
Prothrombin Time: 16.2 seconds — ABNORMAL HIGH (ref 11.4–15.0)

## 2015-10-09 LAB — SURGICAL PCR SCREEN
MRSA, PCR: NEGATIVE
Staphylococcus aureus: NEGATIVE

## 2015-10-09 SURGERY — LEFT HEART CATH AND CORONARY ANGIOGRAPHY
Anesthesia: Moderate Sedation

## 2015-10-09 SURGERY — LEFT HEART CATH AND CORONARY ANGIOGRAPHY
Anesthesia: Moderate Sedation | Laterality: Bilateral

## 2015-10-09 MED ORDER — FENTANYL CITRATE (PF) 100 MCG/2ML IJ SOLN
INTRAMUSCULAR | Status: DC | PRN
  Administered 2015-10-09: 50 ug via INTRAVENOUS

## 2015-10-09 MED ORDER — POTASSIUM CHLORIDE 10 MEQ/100ML IV SOLN
10.0000 meq | INTRAVENOUS | Status: AC
  Administered 2015-10-09 (×4): 10 meq via INTRAVENOUS
  Filled 2015-10-09 (×4): qty 100

## 2015-10-09 MED ORDER — HEPARIN SODIUM (PORCINE) 1000 UNIT/ML IJ SOLN
INTRAMUSCULAR | Status: DC | PRN
  Administered 2015-10-09: 4000 [IU] via INTRAVENOUS

## 2015-10-09 MED ORDER — POTASSIUM CHLORIDE CRYS ER 20 MEQ PO TBCR
20.0000 meq | EXTENDED_RELEASE_TABLET | Freq: Two times a day (BID) | ORAL | Status: DC
  Administered 2015-10-09 – 2015-10-12 (×8): 20 meq via ORAL
  Filled 2015-10-09 (×8): qty 1

## 2015-10-09 MED ORDER — METOPROLOL TARTRATE 50 MG PO TABS
50.0000 mg | ORAL_TABLET | Freq: Two times a day (BID) | ORAL | Status: DC
  Administered 2015-10-09: 50 mg via ORAL
  Filled 2015-10-09: qty 1

## 2015-10-09 MED ORDER — BOOST / RESOURCE BREEZE PO LIQD
237.0000 mL | Freq: Three times a day (TID) | ORAL | Status: DC
  Administered 2015-10-09 – 2015-10-12 (×10): 1 via ORAL

## 2015-10-09 MED ORDER — METOPROLOL TARTRATE 50 MG PO TABS
50.0000 mg | ORAL_TABLET | Freq: Two times a day (BID) | ORAL | Status: DC
  Administered 2015-10-09 – 2015-10-12 (×7): 50 mg via ORAL
  Filled 2015-10-09 (×2): qty 1
  Filled 2015-10-09: qty 2
  Filled 2015-10-09 (×3): qty 1

## 2015-10-09 MED ORDER — POTASSIUM CHLORIDE 10 MEQ/100ML IV SOLN
10.0000 meq | INTRAVENOUS | Status: AC
  Administered 2015-10-09 – 2015-10-10 (×6): 10 meq via INTRAVENOUS
  Filled 2015-10-09 (×6): qty 100

## 2015-10-09 MED ORDER — CEFTRIAXONE SODIUM 2 G IJ SOLR: 2.0000 g | INTRAMUSCULAR | Status: DC

## 2015-10-09 MED ORDER — MIDAZOLAM HCL 2 MG/2ML IJ SOLN
INTRAMUSCULAR | Status: AC
  Filled 2015-10-09: qty 2

## 2015-10-09 MED ORDER — VERAPAMIL HCL 2.5 MG/ML IV SOLN
INTRAVENOUS | Status: AC
  Filled 2015-10-09: qty 2

## 2015-10-09 MED ORDER — PIPERACILLIN-TAZOBACTAM 3.375 G IVPB
3.3750 g | Freq: Four times a day (QID) | INTRAVENOUS | Status: DC
  Administered 2015-10-09 – 2015-10-10 (×6): 3.375 g via INTRAVENOUS
  Filled 2015-10-09 (×9): qty 50

## 2015-10-09 MED ORDER — FENTANYL CITRATE (PF) 100 MCG/2ML IJ SOLN
INTRAMUSCULAR | Status: AC
  Filled 2015-10-09: qty 2

## 2015-10-09 MED ORDER — DEXTROSE 5 % IV SOLN
2.0000 g | INTRAVENOUS | Status: DC
  Filled 2015-10-09: qty 2

## 2015-10-09 MED ORDER — FUROSEMIDE 10 MG/ML IJ SOLN
40.0000 mg | Freq: Once | INTRAMUSCULAR | Status: AC
  Administered 2015-10-09: 40 mg via INTRAVENOUS
  Filled 2015-10-09: qty 4

## 2015-10-09 MED ORDER — HEPARIN (PORCINE) IN NACL 100-0.45 UNIT/ML-% IJ SOLN
1950.0000 [IU]/h | INTRAMUSCULAR | Status: DC
  Administered 2015-10-09 – 2015-10-10 (×2): 1550 [IU]/h via INTRAVENOUS
  Administered 2015-10-10 – 2015-10-13 (×5): 1950 [IU]/h via INTRAVENOUS
  Filled 2015-10-09 (×8): qty 250

## 2015-10-09 MED ORDER — METOPROLOL TARTRATE 1 MG/ML IV SOLN
5.0000 mg | INTRAVENOUS | Status: DC | PRN
  Administered 2015-10-09 – 2015-10-10 (×2): 5 mg via INTRAVENOUS
  Filled 2015-10-09 (×2): qty 5

## 2015-10-09 MED ORDER — VERAPAMIL HCL 2.5 MG/ML IV SOLN
INTRAVENOUS | Status: DC | PRN
  Administered 2015-10-09: 2.5 mg via INTRA_ARTERIAL

## 2015-10-09 MED ORDER — MAGNESIUM SULFATE IN D5W 10-5 MG/ML-% IV SOLN
1.0000 g | Freq: Once | INTRAVENOUS | Status: AC
Start: 2015-10-09 — End: 2015-10-09
  Administered 2015-10-09: 1 g via INTRAVENOUS
  Filled 2015-10-09: qty 100

## 2015-10-09 MED ORDER — MIDAZOLAM HCL 2 MG/2ML IJ SOLN
INTRAMUSCULAR | Status: DC | PRN
  Administered 2015-10-09: 1 mg via INTRAVENOUS

## 2015-10-09 MED ORDER — IOHEXOL 300 MG/ML  SOLN
INTRAMUSCULAR | Status: DC | PRN
  Administered 2015-10-09: 110 mL via INTRA_ARTERIAL

## 2015-10-09 MED ORDER — ASPIRIN 81 MG PO CHEW
81.0000 mg | CHEWABLE_TABLET | Freq: Every day | ORAL | Status: DC
  Administered 2015-10-10 – 2015-10-12 (×3): 81 mg via ORAL
  Filled 2015-10-09 (×3): qty 1

## 2015-10-09 MED ORDER — HEPARIN (PORCINE) IN NACL 2-0.9 UNIT/ML-% IJ SOLN
INTRAMUSCULAR | Status: AC
  Filled 2015-10-09: qty 500

## 2015-10-09 MED ORDER — PANTOPRAZOLE SODIUM 40 MG PO TBEC
40.0000 mg | DELAYED_RELEASE_TABLET | Freq: Every day | ORAL | Status: DC
  Administered 2015-10-09 – 2015-10-12 (×4): 40 mg via ORAL
  Filled 2015-10-09 (×4): qty 1

## 2015-10-09 MED ORDER — HEPARIN SODIUM (PORCINE) 1000 UNIT/ML IJ SOLN
INTRAMUSCULAR | Status: AC
  Filled 2015-10-09: qty 1

## 2015-10-09 MED ORDER — ATORVASTATIN CALCIUM 40 MG PO TABS
40.0000 mg | ORAL_TABLET | Freq: Every day | ORAL | Status: DC
  Administered 2015-10-09 – 2015-10-15 (×6): 40 mg via ORAL
  Filled 2015-10-09 (×6): qty 1

## 2015-10-09 MED ORDER — FE FUMARATE-B12-VIT C-FA-IFC PO CAPS
1.0000 | ORAL_CAPSULE | Freq: Three times a day (TID) | ORAL | Status: DC
  Administered 2015-10-09 – 2015-10-12 (×11): 1 via ORAL
  Filled 2015-10-09 (×17): qty 1

## 2015-10-09 SURGICAL SUPPLY — 7 items
CATH INFINITI 5FR ANG PIGTAIL (CATHETERS) ×3 IMPLANT
CATH OPTITORQUE JACKY 4.0 5F (CATHETERS) ×3 IMPLANT
DEVICE RAD TR BAND REGULAR (VASCULAR PRODUCTS) ×3 IMPLANT
GLIDESHEATH SLEND SS 6F .021 (SHEATH) ×3 IMPLANT
KIT MANI 3VAL PERCEP (MISCELLANEOUS) ×3 IMPLANT
PACK CARDIAC CATH (CUSTOM PROCEDURE TRAY) ×3 IMPLANT
WIRE SAFE-T 1.5MM-J .035X260CM (WIRE) ×3 IMPLANT

## 2015-10-09 NOTE — Interval H&P Note (Signed)
History and Physical Interval Note:  10/09/2015 7:52 AM  Preston Bell  has presented today for surgery, with the diagnosis of Nstemi Possible PCI    ICU5 Inpatient Left Heart Spoke with Tressie Ellis in [id number]   The various methods of treatment have been discussed with the patient and family. After consideration of risks, benefits and other options for treatment, the patient has consented to  Procedure(s): Left Heart Cath and Coronary Angiography (N/A) as a surgical intervention .  The patient's history has been reviewed, patient examined, no change in status, stable for surgery.  I have reviewed the patient's chart and labs.  Questions were answered to the patient's satisfaction.     Kathlyn [place]

## 2015-10-09 NOTE — H&P (View-Only) (Signed)
Patient: Preston Bell / Admit Date: 10/05/2015 / Date of Encounter: 10/08/2015, 1:40 PM   Subjective: Feels well overnight, and this morning, belching improved Denies abdominal discomfort Had a very small bowel movement Denies chest pain, on heparin infusion, has not ambulated Still on IV fluids at 75 cc per hour  Review of Systems: Review of Systems  Constitutional: Negative.   Respiratory: Negative.   Cardiovascular: Negative.   Gastrointestinal: Positive for abdominal pain.  Musculoskeletal: Negative.   Neurological: Negative.   Psychiatric/Behavioral: Negative.   All other systems reviewed and are negative.   Objective: Telemetry: NSR, no significant arrhythmia Physical Exam: Blood pressure 141/78, pulse 111, temperature 98.2 F (36.8 C), temperature source Oral, resp. rate 28, height 6\' 2"  (1.88 m), weight 171 lb 15.3 oz (78 kg), SpO2 92 %. Body mass index is 22.07 kg/(m^2). General: Well developed, well nourished, in no acute distress. Head: Normocephalic, atraumatic, sclera non-icteric, no xanthomas, nares are without discharge. Neck: Negative for carotid bruits. JVP not elevated. Lungs: Clear bilaterally to auscultation without wheezes, rales, or rhonchi. Breathing is unlabored. Heart: RRR S1 S2 without murmurs, rubs, or gallops.  Abdomen: Soft, non-tender, non-distended with normoactive bowel sounds.  Extremities: No clubbing or cyanosis. No edema. Distal pedal pulses are 2+ and equal bilaterally. Neuro: Alert and oriented X 3. Moves all extremities spontaneously. Psych:  Responds to questions appropriately with a normal affect.   Intake/Output Summary (Last 24 hours) at 10/08/15 1340 Last data filed at 10/08/15 1200  Gross per 24 hour  Intake 2321.59 ml  Output    805 ml  Net 1516.59 ml    Inpatient Medications:  . aspirin EC  81 mg Oral Daily  . atorvastatin  40 mg Oral Daily  . cefTRIAXone (ROCEPHIN)  IV  2 g Intravenous Q24H  . lisinopril  20 mg  Oral Daily  . metoprolol succinate  100 mg Oral Daily  . mirtazapine  15 mg Oral QHS  . pantoprazole (PROTONIX) IV  40 mg Intravenous QHS   Infusions:  . sodium chloride 75 mL/hr at 10/08/15 0152  . heparin 1,550 Units/hr (10/08/15 0813)    Labs:  Recent Labs  10/05/15 2141 10/06/15 0303  NA 136 136  K 4.1 3.8  CL 105 103  CO2 24 27  GLUCOSE 151* 125*  BUN 19 18  CREATININE 1.07 0.99  CALCIUM 7.9* 8.1*  MG 1.1* 2.4  PHOS  --  2.7    Recent Labs  10/06/15 0303  AST 174*  ALT 103*  ALKPHOS 58  BILITOT 2.2*  PROT 5.9*  ALBUMIN 3.2*    Recent Labs  10/07/15 0229 10/08/15 0635  WBC 9.1 7.6  HGB 10.5* 9.4*  HCT 30.2* 27.1*  MCV 93.6 94.4  PLT 98* 103*    Recent Labs  10/06/15 0303 10/06/15 1119 10/06/15 1731 10/06/15 2331  TROPONINI 19.47* 13.95* 11.27* 7.56*   Invalid input(s): POCBNP No results for input(s): HGBA1C in the last 72 hours.   Weights: Filed Weights   10/05/15 0821 10/05/15 0952 10/05/15 1700  Weight: 170 lb (77.111 kg) 180 lb (81.647 kg) 171 lb 15.3 oz (78 kg)     Radiology/Studies:  Dg Abd 1 View  10/05/2015  CLINICAL DATA:  . IMPRESSION: Nonspecific bowel gas pattern. Relative paucity of bowel gas with an isolated right abdominal fluid level. No free intraperitoneal air identified Electronically Signed   By: Abigail Miyamoto M.D.   On: 10/05/2015 09:45   [place] Intraoperative  10/05/2015  CLINICAL DATA:  Ultrasound was provided for use by the ordering physician, and a technical charge was applied by the performing facility.  No radiologist interpretation/professional services rendered.   Ct Abdomen Pelvis W Contrast  10/05/2015  CLINICAL DATA:   IMPRESSION: 1. Cholelithiasis with gallbladder wall thickening with pericholecystic fluid and surrounding inflammatory changes most concerning for acute cholecystitis. 2. Mild right basilar calcified pleural plaque with a 4.2 x 1.5 cm nodular area adjacently which may reflect focal pleural thickening  versus fibrosis. Recommend follow-up CT chest in 3-6 months. Electronically Signed   By: Kathreen Devoid   On: 10/05/2015 11:11   Ir Perc Cholecystostomy  10/05/2015  CLINICAL DATA:   IMPRESSION: Percutaneous cholecystostomy with placement of 10-French drainage catheter into the gallbladder lumen. This was left to gravity drainage. Electronically Signed   By: Aletta Edouard M.D.   On: 10/05/2015 16:33   Dg Chest Portable 1 View  10/05/2015  CLINICAL DATA:  V IMPRESSION: No acute cardiopulmonary abnormality seen. Electronically Signed   By: Marijo Conception, M.D.   On: 10/05/2015 09:44     Assessment and Plan  75 y.o. male    1. Elevated Troponin troponin 19.47 at peak consistent with NSTEMI  cardiac risk factors including HTN, HLD, and a history of tobacco use.  On  Heparin infusion, will discontinue in the a.m. prior to catheterization cardiac catheterization scheduled for tomorrow a.m. first case  2. HTN - continue current treatment Ranging from 123456 up to [id number] systolic on average  3. HLD - continue statin therapy, on Lipitor 40 mg daily  4. Acute Cholecystitis/ Sepsis  - presented with nausea, vomiting, and diarrhea. - Lactic Acid 8.9, WBC 23.1 upon arrival to the ED. WBC improved to 11.3 and Lactic Acid at 3.2 on recent lab draws. Tachycardic into the 150's upon arrival with questionable episodes of SVT.  - CT of the abdomen showed acute cholecystitis.   current high-surgical risk.  IR was consulted and a percutaneous cholecystostomy with placement of a drainage catheter was placed into the gallbladder lumen.   Risk and benefit of the procedure discussed with the patient's, He is aware of risk of heart attack and stroke. Recovery time discussed with him including need for possible stenting and additional hospital stay. Orders will be placed for procedure in the morning   Total encounter time more than 35 minutes  Greater than 50% was spent in counseling and coordination of care  with the patient   Signed, Esmond Plants, MD, Ph.D. [facility] 10/08/2015, 1:40 PM

## 2015-10-09 NOTE — Procedures (Signed)
TR band deflated.  No hematoma, no active bleeding.  Pt instructed to limit use of right wrist for remainder of the day.  Pt verbalized understanding.

## 2015-10-09 NOTE — H&P (Signed)
Patient Demographics:    Preston Bell, is a 75 y.o. male  MRN: [id number]   DOB - 04-04-1941  Admit Date - 10/09/2015  Outpatient Primary MD for the patient is Pcp Not In System   With History of -  Past Medical History  Diagnosis Date  . Cancer ([place])   . History of colectomy   . Hypertension   . GERD (gastroesophageal reflux disease)   . HLD (hyperlipidemia)       Past Surgical History  Procedure Laterality Date  . Colostomy reversal    . Cardiac catheterization N/A 10/09/2015    Procedure: Left Heart Cath and Coronary Angiography;  Surgeon: Wellington Hampshire, MD;  Location: [facility];  Service: Cardiovascular;  Laterality: N/A;    in for   No chief complaint on file.     HPI:    Preston Bell  is a 75 y.o. male, with history of essential hypertension, dyslipidemia, reflux, colon cancer with history of colostomy in the past which has been reversed, nonocclusive CAD patient of Dr. Fletcher Anon, to [facility] who was admitted to [facility] on 10/05/2015 with right upper quadrant abdominal pain which was thought to be secondary to acute cholecystitis with Klebsiella bacteremia, he was seen by various subspecialists and underwent cholecystectomy drain placement along with IV antibiotics, he was stable from that standpoint, during his workup he was found to have elevated troponin and also went into SVT, he never had any chest pain, he was seen by cardiology and underwent left heart catheterization on 10/09/2015 showing 90% left main lesion and 95% right RCA lesion, patient was then transferred to [facility] for possible CABG after case was discussed with Dr. Lawson Fiscal.  He is currently on heparin drip, beta blocker and symptom free, denies any fever or chills, no  chest pain palpitations cough or shortness of breath, upon arrival he was found to have a heart rate of 140s, cardiology locally at [facility] has been requested to reevaluate the patient, I have also discussed the case with cardiothoracic surgery.    Review of systems:    In addition to the HPI above,   No Fever-chills, No Headache, No changes with Vision or hearing, No problems swallowing food or Liquids, No Chest pain, Cough or Shortness of Breath, No Abdominal pain, No Nausea or Vommitting, Bowel movements are regular, No Blood in stool or Urine, No dysuria, No new skin rashes or bruises, No new joints pains-aches,  No new weakness, tingling, numbness in any extremity, No recent weight gain or loss, No polyuria, polydypsia or polyphagia, No significant Mental Stressors.  A full 10 point Review of Systems was done, except as stated above, all other Review of Systems were negative.    Social History:     Social History  Substance Use Topics  . Smoking status: Former Smoker -- 1.00 packs/day for 30 years  Quit date: 10/06/1983  . Smokeless tobacco: Never Used  . Alcohol Use: No     Comment: Quit consuming alcohol in 1988.    Lives - at home, independent      Family History :     Family History  Problem Relation Age of Onset  . Cervical cancer Mother   . Lung cancer Father   . Lung cancer Brother   . Hypertension Brother   . Hypertension Son        Home Medications:   Prior to Admission medications   Medication Sig Start Date End Date Taking? Authorizing Provider  aspirin EC 81 MG tablet Take 81 mg by mouth daily. 06/14/15 02/20/29 Yes Historical Provider, MD  atorvastatin (LIPITOR) 20 MG tablet Take 40 mg by mouth every morning.  06/14/15 06/13/16 Yes Historical Provider, MD  lisinopril (PRINIVIL,ZESTRIL) 40 MG tablet Take 40 mg by mouth every morning.  06/14/15 06/13/16 Yes Historical Provider, MD  metoprolol succinate (TOPROL XL) 50 MG 24 hr tablet Take  50 mg by mouth daily. 06/14/15 06/13/16 Yes Historical Provider, MD  mirtazapine (REMERON) 15 MG tablet Take 15 mg by mouth at bedtime. 06/14/15 06/13/16 Yes Historical Provider, MD  omeprazole (PRILOSEC) 20 MG capsule Take 20 mg by mouth daily. 06/14/15  Yes Historical Provider, MD     Allergies:    No Known Allergies   Physical Exam:   Vitals  Blood pressure 144/75, pulse 126, resp. rate 20, SpO2 100 %.   1. General frail elderly white male lying in bed in NAD,     2. Normal affect and insight, Not Suicidal or Homicidal, Awake Alert, Oriented X 3.  3. No F.N deficits, ALL C.Nerves Intact, Strength 5/5 all 4 extremities, Sensation intact all 4 extremities, Plantars down going.  4. Ears and Eyes appear Normal, Conjunctivae clear, PERRLA. Moist Oral Mucosa.  5. Supple Neck, No JVD, No cervical lymphadenopathy appriciated, No Carotid Bruits.  6. Symmetrical Chest wall movement, Good air movement bilaterally, CTAB.  7. RRR, No Gallops, Rubs or Murmurs, No Parasternal Heave.  8. Positive Bowel Sounds, Abdomen Soft, mild RUQ tenderness, No organomegaly appriciated, No rebound -guarding or rigidity. RUQ GB drain in place, large LLQ abdominal wall hernia  9.  No Cyanosis, Normal Skin Turgor, No Skin Rash or Bruise.  10. Good muscle tone,  joints appear normal , no effusions, Normal ROM.  11. No Palpable Lymph Nodes in Neck or Axillae      Data Review:    CBC  Recent Labs Lab 10/05/15 0839 10/06/15 0303 10/07/15 0229 10/08/15 0635 10/09/15 0009  WBC 23.1* 11.3* 9.1 7.6 6.9  HGB 13.2 10.8* 10.5* 9.4* 9.1*  HCT 39.3* 31.4* 30.2* 27.1* 26.0*  PLT 238 115* 98* 103* 111*  MCV 96.4 94.2 93.6 94.4 93.1  MCH 32.4 32.3 32.5 32.8 32.6  MCHC 33.6 34.3 34.8 34.7 35.0  RDW 14.1 14.1 13.9 13.8 14.0  LYMPHSABS 0.8*  --   --   --   --   MONOABS 0.7  --   --   --   --   EOSABS 0.0  --   --   --   --   BASOSABS 0.1  --   --   --   --     ------------------------------------------------------------------------------------------------------------------  Chemistries   Recent Labs Lab 10/05/15 0839 10/05/15 2141 10/06/15 0303 10/09/15 0005  NA 141 136 136 137  K 3.6 4.1 3.8 2.9*  CL 104 105 103 108  CO2 16* 24  27 22  GLUCOSE 216* 151* 125* 89  BUN 21* 19 18 14   CREATININE 1.51* 1.07 0.99 0.86  CALCIUM 9.8 7.9* 8.1* 7.8*  MG  --  1.1* 2.4  --   AST 190*  --  174*  --   ALT 173*  --  103*  --   ALKPHOS 125  --  58  --   BILITOT 1.9*  --  2.2*  --    ------------------------------------------------------------------------------------------------------------------ estimated creatinine clearance is 83.1 mL/min (by C-G formula based on Cr of 0.86). ------------------------------------------------------------------------------------------------------------------ No results for input(s): TSH, T4TOTAL, T3FREE, THYROIDAB in the last 72 hours.  Invalid input(s): FREET3   Coagulation profile  Recent Labs Lab 10/05/15 0839 10/09/15 0005  INR 1.18 1.29   ------------------------------------------------------------------------------------------------------------------- No results for input(s): DDIMER in the last 72 hours. -------------------------------------------------------------------------------------------------------------------  Cardiac Enzymes  Recent Labs Lab 10/06/15 1119 10/06/15 1731 10/06/15 2331  TROPONINI 13.95* 11.27* 7.56*   ------------------------------------------------------------------------------------------------------------------ No results found for: BNP   ---------------------------------------------------------------------------------------------------------------  Urinalysis    Component Value Date/Time   COLORURINE YELLOW 10/09/2015 1557   COLORURINE Yellow 11/17/2013 0927   APPEARANCEUR CLEAR 10/09/2015 1557   APPEARANCEUR Clear 11/17/2013 0927   LABSPEC 1.017  10/09/2015 1557   LABSPEC 1.005 11/17/2013 0927   PHURINE 7.5 10/09/2015 1557   PHURINE 5.0 11/17/2013 0927   GLUCOSEU NEGATIVE 10/09/2015 1557   GLUCOSEU Negative 11/17/2013 0927   HGBUR NEGATIVE 10/09/2015 1557   HGBUR Negative 11/17/2013 0927   BILIRUBINUR NEGATIVE 10/09/2015 1557   BILIRUBINUR Negative 11/17/2013 0927   KETONESUR NEGATIVE 10/09/2015 1557   KETONESUR Negative 11/17/2013 0927   PROTEINUR NEGATIVE 10/09/2015 1557   PROTEINUR Negative 11/17/2013 0927   NITRITE NEGATIVE 10/09/2015 1557   NITRITE Negative 11/17/2013 0927   LEUKOCYTESUR NEGATIVE 10/09/2015 1557   LEUKOCYTESUR Negative 11/17/2013 0927    ----------------------------------------------------------------------------------------------------------------   Imaging Results:    No results found.  Repeat EKG order   Assessment & Plan:      1. Non-STEMI. Incidentally found during hospital workup for cholecystitis. Cardiac catheterization showing 90% lesion of left main and 95% in mid RCA . Patient is completely symptom free, continue heparin drip, continue beta blocker, add low-dose aspirin and statin for secondary prevention, cardiology has been called and I have discussed the case with cardiothoracic surgery as well. Per Dr. Lucianne Lei tright patient to undergo CABG in the next 5-6 days once stable from bacteremia standpoint.  2. Acute cholecystitis complicated with sepsis and Klebsiella bacteremia. Sensitivities pending. Currently on IV Zosyn, cholecystectomy drain placed at [facility] patient is symptom-free, continue with supportive care. Outpatient follow-up with surgery post CABG.  3. SVT/sinus tachycardia. Initially responded well to Adenosine/Cardizem, currently on beta blocker, IV Lopressor order did well, heart rate high upon admission, repeat EKG, cardiology requested to reevaluate.  4. GERD. On PPI.  5. Dyslipidemia. Resume statin.  6. Essential hypertension. Currently on beta blocker will  monitor.  7. Hypokalemia. Replace IV and by mouth, 1 g of IV mag, monitor electrolytes.  8. History of colon cancer, abdominal wall hernia, remote history of smoking and alcohol abuse. Outpatient follow-up with PCP and general surgery as needed.    DVT Prophylaxis Heparin   AM Labs Ordered, also please review Full Orders  Family Communication: Admission, patients condition and plan of care including tests being ordered have been discussed with the patient who indicates understanding and agree with the plan and Code Status.  Code Status Full  Likely DC to  Home 7-10 day  Condition GUARDED  Time spent in minutes : 35    Makayla Confer K M.D on 10/09/2015 at 4:49 PM  Between 7am to 7pm - Pager - [phone]  After 7pm go to [email or web address] - password [facility]  [facility] - Office  [phone]

## 2015-10-09 NOTE — Consult Note (Signed)
[address]       [place] [address]             [phone]        Amil E Co[place] Medical Record [id number] Date of Birth: 25-Jan-1941  Referring: Dr Fletcher Anon Primary Care: Chief Complaint:  Chest pain  History of Present Illness:    Patient examined, cardiac catheterization and echocardiogram images personally reviewed. Patient had a left heart cath-coronary angiograms today at [facility] after sustaining a non-ST elevation MI last week when the patient was admitted with acute cholecystitis, sepsis, and blood cultures positive for Klebsiella. The patient was scheduled for urgent cholecystectomy but then developed cardiac arrhythmias and enzymes return positive. Surgical plan was altered to a IR cholecystostomy drainage tube which was performed without complication and was followed with overall clinical improvement in acid-base balance, leukocytosis, and abdominal pain. The patient has been on IV antibiotics 4 days.  Cardiac catheterization today shows a significant left main stenosis and a significant mid RCA stenosis. LVEDP is elevated at 30. Echocardiogram performed last week shows fairly well preserved LV systolic function.  The patient's history is significant for resection of colon cancer 4-5 years ago followed by large incisional hernia in the left upper quadrant. He states there is been no evidence of recurrent colon cancer on several postoperative checks. He states he received radiation therapy but no chemotherapy. On the CT scan of the abdomen performed last week when the patient was admitted with acute cholecystitis and sepsis there is no evidence of metastatic disease in the liver or mesenteric nodes.  The patient has a remote history of alcohol and tobacco abuse.  Current Activity/ Functional Status: Patient lives with his daughter and remains fairly active until his recent onset of cholecystitis and non-ST elevation MI   Zubrod  Score: At the time of surgery this patient's most appropriate activity status/level should be described as: []     0    Normal activity, no symptoms []     1    Restricted in physical strenuous activity but ambulatory, able to do out light work [x]     2    Ambulatory and capable of self care, unable to do work activities, up and about                 more than 50%  Of the time                            []     3    Only limited self care, in bed greater than 50% of waking hours []     4    Completely disabled, no self care, confined to bed or chair []     5    Moribund  Past Medical History  Diagnosis Date  . Cancer ([place])   . History of colectomy   . Hypertension   . GERD (gastroesophageal reflux disease)   . HLD (hyperlipidemia)     Past Surgical History  Procedure Laterality Date  . Colostomy reversal    . Cardiac catheterization N/A 10/09/2015    Procedure: Left Heart Cath and Coronary Angiography;  Surgeon: Wellington Hampshire, MD;  Location: [facility];  Service: Cardiovascular;  Laterality: N/A;    History  Smoking status  . Former Smoker -- 1.00 packs/day for 30 years  . Quit date: 10/06/1983  Smokeless tobacco  . Never Used  History  Alcohol Use No    Comment: Quit consuming alcohol in 1988.    Social History   Social History  . Marital Status: Legally Separated    Spouse Name: N/A  . Number of Children: N/A  . Years of Education: N/A   Occupational History  . Not on file.   Social History Main Topics  . Smoking status: Former Smoker -- 1.00 packs/day for 30 years    Quit date: 10/06/1983  . Smokeless tobacco: Never Used  . Alcohol Use: No     Comment: Quit consuming alcohol in 1988.  . Drug Use: Yes    Special: Marijuana  . Sexual Activity: Not Currently   Other Topics Concern  . Not on file   Social History Narrative    No Known Allergies  Current Facility-Administered Medications  Medication Dose Route Frequency Provider Last Rate Last  Dose  . [START ON 10/10/2015] aspirin chewable tablet 81 mg  81 mg Oral Daily Thurnell Lose, MD      . atorvastatin (LIPITOR) tablet 40 mg  40 mg Oral q1800 Thurnell Lose, MD   40 mg at 10/09/15 1801  . feeding supplement (BOOST / RESOURCE BREEZE) liquid 1 Container  237 mL Oral TID WC Ivin Poot, MD   1 Container at 10/09/15 1542  . ferrous Q000111Q C-folic acid (TRINSICON / FOLTRIN) capsule 1 capsule  1 capsule Oral TID PC Ivin Poot, MD   1 capsule at 10/09/15 1800  . heparin ADULT infusion 100 units/mL (25000 units/250 mL)  1,550 Units/hr Intravenous Continuous Kris Mouton, RPH 15.5 mL/hr at 10/09/15 1545 1,550 Units/hr at 10/09/15 1545  . magnesium sulfate IVPB 1 g 100 mL  1 g Intravenous Once Thurnell Lose, MD      . metoprolol (LOPRESSOR) injection 5 mg  5 mg Intravenous Q4H PRN Thurnell Lose, MD   5 mg at 10/09/15 1721  . metoprolol (LOPRESSOR) tablet 50 mg  50 mg Oral BID Thurnell Lose, MD   50 mg at 10/09/15 1830  . pantoprazole (PROTONIX) EC tablet 40 mg  40 mg Oral Q1200 Ivin Poot, MD   40 mg at 10/09/15 1519  . piperacillin-tazobactam (ZOSYN) IVPB 3.375 g  3.375 g Intravenous 4 times per day Ivin Poot, MD   3.375 g at 10/09/15 1801  . potassium chloride 10 mEq in 100 mL IVPB  10 mEq Intravenous Q1 Hr x 6 Thurnell Lose, MD   10 mEq at 10/09/15 1801  . potassium chloride SA (K-DUR,KLOR-CON) CR tablet 20 mEq  20 mEq Oral BID Ivin Poot, MD   20 mEq at 10/09/15 1519    Prescriptions prior to admission  Medication Sig Dispense Refill Last Dose  . aspirin EC 81 MG tablet Take 81 mg by mouth daily.   10/09/2015 at Unknown time  . atorvastatin (LIPITOR) 20 MG tablet Take 40 mg by mouth every morning.    10/09/2015 at Unknown time  . lisinopril (PRINIVIL,ZESTRIL) 40 MG tablet Take 40 mg by mouth every morning.    10/09/2015 at Unknown time  . metoprolol succinate (TOPROL XL) 50 MG 24 hr tablet Take 50 mg by mouth daily.   10/09/2015 at  0630  . mirtazapine (REMERON) 15 MG tablet Take 15 mg by mouth at bedtime.   10/08/2015 at Unknown time  . omeprazole (PRILOSEC) 20 MG capsule Take 20 mg by mouth daily.   10/09/2015 at Unknown time    Family  History  Problem Relation Age of Onset  . Cervical cancer Mother   . Lung cancer Father   . Lung cancer Brother   . Hypertension Brother   . Hypertension Son      Review of Systems:       Cardiac Review of Systems: Y or N  Chest Pain [ yes  ]  Resting SOB [ no  ] Exertional SOB  [ no ]  Orthopnea [no  ]   Pedal Edema [ no  ]    Palpitations [ no ] Syncope  [ no ]   Presyncope [  no ]  General Review of Systems: [Y] = yes [  ]=no Constitional: recent weight change [  ]; anorexia [  ]; fatigue [  ]; nausea [yes, improved  ]; night sweats [  ]; fever [  ]; or chills [  ]                                                               Dental: poor dentition[ edentulous ]; Last Dentist visit: Greater than one year  Eye : blurred vision [  ]; diplopia [   ]; vision changes [  ];  Amaurosis fugax[  ]; Resp: cough [  ];  wheezing[  ];  hemoptysis[  ]; shortness of breath[  ]; paroxysmal nocturnal dyspnea[  ]; dyspnea on exertion[  ]; or orthopnea[  ];  GI:  gallstones[  ], vomiting[yes now improved  ];  dysphagia[  ]; melena[  ];  hematochezia [  ]; heartburn[  ];   Hx of  Colonoscopy[  ]; GU: kidney stones [  ]; hematuria[  ];   dysuria [  ];  nocturia[  ];  history of     obstruction [  ]; urinary frequency [  ]             Skin: rash, swelling[  ];, hair loss[  ];  peripheral edema[  ];  or itching[  ]; Musculosketetal: myalgias[  ];  joint swelling[  ];  joint erythema[  ];  joint pain[  ];  back pain[  ];  Heme/Lymph: bruising[  ];  bleeding[  ];  anemia[  ];  Neuro: TIA[  ];  headaches[  ];  stroke[  ];  vertigo[  ];  seizures[  ];   paresthesias[  ];  difficulty walking[  ];  Psych:depression[  ]; anxiety[  ];  Endocrine: diabetes[  ];  thyroid dysfunction[  ];  Immunizations: Flu  [  ]; Pneumococcal[  ];  Other: Patient is right-hand dominant                          No history of thoracic trauma rib fractures or pneumothorax  Physical Exam: BP 146/59 mmHg  Pulse 103  Resp 20  SpO2 100%      Physical Exam  General: Thin elderly and chronically ill-appearing Caucasian male HEENT: Normocephalic pupils equal , edentulous Neck: Supple without JVD, adenopathy, or bruit Chest: Clear to auscultation, symmetrical breath sounds, no rhonchi, no tenderness             or deformity Cardiovascular: Regular rate and rhythm, no murmur, no gallop, peripheral pulses  palpable in all extremities Abdomen:  Soft, nontender, no palpable mass or organomegaly-multiple surgical scars-large incisional hernia left upper quadrant at site of previous colostomy takedown. Cholecystostomy-bile  Drain in right upper quadrant Extremities: Warm, well-perfused, no clubbing cyanosis edema or tenderness,              no venous stasis changes of the legs Rectal/GU: Deferred Neuro: Grossly non--focal and symmetrical throughout Skin: Clean and dry without rash or ulceration    Diagnostic Studies & Laboratory data:     Recent Radiology Findings:  Chest x-ray and CT scan of abdomen personally reviewed. 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  Recent Lab Findings: Lab Results  Component Value Date   WBC 6.9 10/09/2015   HGB 9.1* 10/09/2015   HCT 26.0* 10/09/2015   PLT 111* 10/09/2015   GLUCOSE 89 10/09/2015   ALT 103* 10/06/2015   AST 174* 10/06/2015   NA 137 10/09/2015   K 2.9* 10/09/2015   CL 108 10/09/2015   CREATININE 0.86 10/09/2015   BUN 14 10/09/2015   CO2 22 10/09/2015   INR 1.29 10/09/2015      Assessment / Plan:     #1 acute cholecystitis with Klebsiella bacteremia with subsequent non-ST elevation MI    #2 significant coronary  disease with left main stenosis and high-grade RCA stenosis   Patient will need full week of IV antibiotics to adequately treat gram-negative bacteremia before undergoing multivessel CABG which will be scheduled during this hospitalization.       @ME1 @ 10/09/2015 7:18 PM

## 2015-10-09 NOTE — Progress Notes (Signed)
Patient already off unit for cardiac cath. Report from [facility]. Will assess when patient returns to room.

## 2015-10-09 NOTE — Progress Notes (Signed)
Discussed Pt care w/ Dr. Abel Presto at [facility]. Requesting transfer to [place] for 2 vessel CABG. Patient initially admitted for sepsis due to acute cholecystitis. Patient had percutaneous drain placed by intervention radiology as he was not a surgical candidate upon admission due to an elevated troponin and eventual cardiac catheterization. Patient will need stabilization from a cardiac standpoint prior to cholecystectomy. Cardiology at [facility] will discuss case with cardiology here, and cardiothoracic surgery. Patient to be sent to [facility] once cardiac telemetry bed available.  Linna Darner, MD [facility] 10/09/2015, 9:43 AM

## 2015-10-09 NOTE — Progress Notes (Signed)
ANTICOAGULATION CONSULT NOTE -follow up [facility] for heparin drip Indication: ACS/STEMI  No Known Allergies  Patient Measurements: Height: 6\' 2"  (188 cm) Weight: 171 lb 15.3 oz (78 kg) IBW/kg (Calculated) : 82.2 Heparin Dosing Weight: 78kg  Vital Signs: Temp: 98.4 F (36.9 C) (02/12 1951) Temp Source: Oral (02/12 1951) BP: 152/76 mmHg (02/12 1952) Pulse Rate: 102 (02/12 1952)  Labs:  Recent Labs  10/06/15 0303  10/06/15 1119 10/06/15 1731 10/06/15 2331 10/07/15 0229  10/08/15 0635 10/08/15 1654 10/09/15 0005 10/09/15 0009  HGB 10.8*  --   --   --   --  10.5*  --  9.4*  --   --  9.1*  HCT 31.4*  --   --   --   --  30.2*  --  27.1*  --   --  26.0*  PLT 115*  --   --   --   --  98*  --  103*  --   --  111*  LABPROT  --   --   --   --   --   --   --   --   --  16.2*  --   INR  --   --   --   --   --   --   --   --   --  1.29  --   HEPARINUNFRC  --   < >  --  0.22*  --  0.29*  < > 0.28* 0.40 0.32  --   CREATININE 0.99  --   --   --   --   --   --   --   --  0.86  --   TROPONINI 19.47*  --  13.95* 11.27* 7.56*  --   --   --   --   --   --   < > = values in this interval not displayed.  Estimated Creatinine Clearance: 83.1 mL/min (by C-G formula based on Cr of 0.86).   Medical History: Past Medical History  Diagnosis Date  . Cancer ([place])   . History of colectomy   . Hypertension   . GERD (gastroesophageal reflux disease)   . HLD (hyperlipidemia)     Medications:    Assessment: Hgb 13.2  plt 230 INR 1.18  aPTT 31 No outpatient anticoag in med rec.  Goal of Therapy:  Heparin level 0.3-0.7 units/ml Monitor platelets by anticoagulation protocol: Yes   Plan:  Heparin level is below goal so will bolus heparin 1200 units and increase infusion to 1100 units/hr. Will recheck a HL in 8 hours.   2/10:  HL @ 17:30 = 0.22 Will order Heparin 1200 units IV X 1 bolus and increase drip rate to 1250 units/hr. Will recheck HL 8 hrs after rate change on  2/11 @ 0230.   2/11 02:30 heparin level 0.29. 1200 unit bolus and increase to 1400 units/hr. Recheck in 8 hours.  2/11 Heparin level at 1144= 0.31. Will continue current rate of 1400 units/hr and check confirmation level in 8 hrs.   2/11 21:31 Heparin level resulted at 0.30. Will continue with current rate and check next level with am labs.  2/12 Heparin level at 0635=0.28. Will give 1200 unit bolus and increase drip to 1550 units/hr. REcheck Heparin level in 8 hrs.  2/12 1700 Heparin level resulted at 0.4. Will continue with current rate and check a confirmation level in 6hr.  2/13 0005 heparin level 0.32. Recheck with  tomorrow AM labs.  Paulina Fusi, PharmD, BCPS 10/09/2015 1:14 AM

## 2015-10-09 NOTE — Discharge Summary (Signed)
[facility] NAME: Preston Bell    MR#:  [id number]  DATE OF BIRTH:  [birth year]  DATE OF ADMISSION:  10/05/2015 ADMITTING PHYSICIAN: Clayburn Pert, MD  DATE OF DISCHARGE: 10/09/2015 11:51 AM  PRIMARY CARE PHYSICIAN: Pcp Not In System    ADMISSION DIAGNOSIS:  Cholecystitis [K81.9] Generalized abdominal pain [R10.84] Vomiting and diarrhea [R11.10, R19.7]  DISCHARGE DIAGNOSIS:  Active Problems:   Cholecystitis   Sepsis ([place])   Elevated troponin I level   NSTEMI (non-ST elevated myocardial infarction) (HCC)   Vomiting and diarrhea   SECONDARY DIAGNOSIS:   Past Medical History  Diagnosis Date  . Cancer ([place])   . History of colectomy   . Hypertension   . GERD (gastroesophageal reflux disease)   . HLD (hyperlipidemia)     HOSPITAL COURSE:   75 year old male with past medical history of colon cancer, hypertension, GERD, anxiety, history of alcohol and tobacco abuse who presents to the hospital with right upper quadrant abdominal pain nausea vomiting.   #1 acute cholecystitis-this was the cause of patient's right upper quadrant abdominal pain nausea vomiting. Patient was seen by general surgery and was not a good surgical candidate as he had SVT on admission and was unstable. -Patient is therefore status post percutaneous cholecystostomy and has tolerated that well. -Blood cultures are positive for Klebsiella and patient was treated with IV ceftriaxone and can be transitioned to oral meds with the next few days. Patient was seen by general surgery and likely patient will need to be discharged to cholecystostomy tube and follow up with them in the next 3-4 weeks.   #2 NSTEMI - patient had ruled in by cardiac markers as his troponin went up to as high as over 20. He never really had any chest pain. He was seen by cardiology and started on a heparin drip along with aspirin and statin and beta blocker. He underwent a  cardiac catheterization on 10/09/2015 which showed significant two-vessel disease including a 90% left main lesion and a 95% mid RCA lesion. -Patient would likely benefit from coronary artery bypass graft surgery and therefore was transferred to [facility] for further evaluation.  - his Echocardiogram showed normal EF of 55 to 60%  #3 sepsis-this was due to acute cholecystitis.  Patient was treated with IV antibiotics with Zosyn initially and then narrowed down to IV ceftriaxone. His blood cultures are positive for Klebsiella which is sensitive to ceftriaxone. His repeat blood cultures are negative. -Post percutaneous cholecystostomy tube placement he has been afebrile and hemodynamically stable.  #4 SVT/sinus tachycardia-this was present on admission and responded well to some adenosine and Cardizem. He has sinus tachycardia which has also improved since admission. -he will Continue metoprolol.  #5. Essential hypertension - he will  cont. Metoprolol, Lisinopril  #6. Hyperlipidemia - he will cont. Atorvastatin  #7. GERD - he will cont. Protonix.   He is being transferred to [facility] for evaluation of CABG due to his significant 2 vessel coronary disease.  DISCHARGE CONDITIONS:   Stable.   CONSULTS OBTAINED:  Treatment Team:  Clayburn Pert, MD Wellington Hampshire, MD  DRUG ALLERGIES:  No Known Allergies  DISCHARGE MEDICATIONS:   Discharge Medication List as of 10/09/2015 11:52 AM    CONTINUE these medications which have NOT CHANGED   Details  aspirin EC 81 MG tablet Take 81 mg by mouth daily., Starting 06/14/2015, Until Fri 02/20/29, Historical Med    atorvastatin (LIPITOR) 20  MG tablet Take 40 mg by mouth daily., Starting 06/14/2015, Until Thu 06/13/16, Historical Med    lisinopril (PRINIVIL,ZESTRIL) 40 MG tablet Take 40 mg by mouth daily., Starting 06/14/2015, Until Thu 06/13/16, Historical Med    metoprolol succinate (TOPROL XL) 50 MG 24 hr tablet Take 50 mg by  mouth daily., Starting 06/14/2015, Until Thu 06/13/16, Historical Med    mirtazapine (REMERON) 15 MG tablet Take 15 mg by mouth at bedtime., Starting 06/14/2015, Until Thu 06/13/16, Historical Med    omeprazole (PRILOSEC) 20 MG capsule Take 20 mg by mouth daily., Starting 06/14/2015, Until Discontinued, Historical Med         DISCHARGE INSTRUCTIONS:   DIET:  Cardiac diet  DISCHARGE CONDITION:  Stable  ACTIVITY:  Activity as tolerated  OXYGEN:  Home Oxygen: No.   Oxygen Delivery: room air  DISCHARGE LOCATION:  [facility]   If you experience worsening of your admission symptoms, develop shortness of breath, life threatening emergency, suicidal or homicidal thoughts you must seek medical attention immediately by calling 911 or calling your MD immediately  if symptoms less severe.  You Must read complete instructions/literature along with all the possible adverse reactions/side effects for all the Medicines you take and that have been prescribed to you. Take any new Medicines after you have completely understood and accpet all the possible adverse reactions/side effects.   Please note  You were cared for by a hospitalist during your hospital stay. If you have any questions about your discharge medications or the care you received while you were in the hospital after you are discharged, you can call the unit and asked to speak with the hospitalist on call if the hospitalist that took care of you is not available. Once you are discharged, your primary care physician will handle any further medical issues. Please note that NO REFILLS for any discharge medications will be authorized once you are discharged, as it is imperative that you return to your primary care physician (or establish a relationship with a primary care physician if you do not have one) for your aftercare needs so that they can reassess your need for medications and monitor your lab values.   DATA REVIEW:    CBC  Recent Labs Lab 10/09/15 0009  WBC 6.9  HGB 9.1*  HCT 26.0*  PLT 111*    Chemistries   Recent Labs Lab 10/06/15 0303 10/09/15 0005  NA 136 137  K 3.8 2.9*  CL 103 108  CO2 27 22  GLUCOSE 125* 89  BUN 18 14  CREATININE 0.99 0.86  CALCIUM 8.1* 7.8*  MG 2.4  --   AST 174*  --   ALT 103*  --   ALKPHOS 58  --   BILITOT 2.2*  --     Cardiac Enzymes  Recent Labs Lab 10/06/15 2331  TROPONINI 7.56*    Microbiology Results  Results for orders placed or performed during the hospital encounter of 10/05/15  Blood culture (routine x 2)     Status: None   Collection Time: 10/05/15  9:06 AM  Result Value Ref Range Status   Specimen Description BLOOD RIGHT IV  Final   Special Requests   Final    BOTTLES DRAWN AEROBIC AND ANAEROBIC AER 4ML ANA 1ML   Culture  Setup Time   Final    GRAM NEGATIVE RODS IN BOTH AEROBIC AND ANAEROBIC BOTTLES CRITICAL RESULT CALLED TO, READ BACK BY AND VERIFIED WITH: MATT MCBANE [id number] 10/05/15 MLZ  Culture   Final    KLEBSIELLA PNEUMONIAE IN BOTH AEROBIC AND ANAEROBIC BOTTLES    Report Status 10/07/2015 FINAL  Final   Organism ID, Bacteria KLEBSIELLA PNEUMONIAE  Final      Susceptibility   Klebsiella pneumoniae - MIC*    AMPICILLIN Value in next row Resistant      RESISTANT>=32    CEFAZOLIN Value in next row Sensitive      SENSITIVE<=4    CEFEPIME Value in next row Sensitive      SENSITIVE<=1    CEFTAZIDIME Value in next row Sensitive      SENSITIVE<=1    CEFTRIAXONE Value in next row Sensitive      SENSITIVE<=1    CIPROFLOXACIN Value in next row Sensitive      SENSITIVE<=0.25    GENTAMICIN Value in next row Sensitive      SENSITIVE<=1    IMIPENEM Value in next row Sensitive      SENSITIVE<=0.25    TRIMETH/SULFA Value in next row Sensitive      SENSITIVE<=20    AMPICILLIN/SULBACTAM Value in next row Sensitive      SENSITIVE8    PIP/TAZO Value in next row Sensitive      SENSITIVE<=4    * KLEBSIELLA PNEUMONIAE  Blood  Culture ID Panel (Reflexed)     Status: Abnormal   Collection Time: 10/05/15  9:06 AM  Result Value Ref Range Status   Enterococcus species NOT DETECTED NOT DETECTED Final   Listeria monocytogenes NOT DETECTED NOT DETECTED Final   Staphylococcus species NOT DETECTED NOT DETECTED Final   Staphylococcus aureus NOT DETECTED NOT DETECTED Final   Streptococcus species NOT DETECTED NOT DETECTED Final   Streptococcus agalactiae NOT DETECTED NOT DETECTED Final   Streptococcus pneumoniae NOT DETECTED NOT DETECTED Final   Streptococcus pyogenes NOT DETECTED NOT DETECTED Final   Acinetobacter baumannii NOT DETECTED NOT DETECTED Final   Enterobacteriaceae species DETECTED (A) NOT DETECTED Final    Comment: CALLED TO MATT MCBANE AT 2255 10/05/15 MLZ    Enterobacter cloacae complex NOT DETECTED NOT DETECTED Final   Escherichia coli NOT DETECTED NOT DETECTED Final   Klebsiella oxytoca NOT DETECTED NOT DETECTED Final   Klebsiella pneumoniae DETECTED (A) NOT DETECTED Final    Comment: CALLED TO MATT MCBANE AT 2255 10/05/15 MLZ    Proteus species NOT DETECTED NOT DETECTED Final   Serratia marcescens NOT DETECTED NOT DETECTED Final   Haemophilus influenzae NOT DETECTED NOT DETECTED Final   Neisseria meningitidis NOT DETECTED NOT DETECTED Final   Pseudomonas aeruginosa NOT DETECTED NOT DETECTED Final   Candida albicans NOT DETECTED NOT DETECTED Final   Candida glabrata NOT DETECTED NOT DETECTED Final   Candida krusei NOT DETECTED NOT DETECTED Final   Candida parapsilosis NOT DETECTED NOT DETECTED Final   Candida tropicalis NOT DETECTED NOT DETECTED Final   Carbapenem resistance NOT DETECTED NOT DETECTED Final   Methicillin resistance NOT DETECTED NOT DETECTED Final   Vancomycin resistance NOT DETECTED NOT DETECTED Final  CULTURE, BLOOD (ROUTINE X 2) w Reflex to PCR ID Panel     Status: None (Preliminary result)   Collection Time: 10/07/15 11:34 AM  Result Value Ref Range Status   Specimen Description  BLOOD RIGHT ANTECUBITAL  Final   Special Requests   Final    BOTTLES DRAWN AEROBIC AND ANAEROBIC  AER 4CC ANA 1CC   Culture NO GROWTH 2 DAYS  Final   Report Status PENDING  Incomplete  CULTURE, BLOOD (ROUTINE X 2) w Reflex  to PCR ID Panel     Status: None (Preliminary result)   Collection Time: 10/07/15 11:34 AM  Result Value Ref Range Status   Specimen Description BLOOD RIGHT HAND  Final   Special Requests BOTTLES DRAWN AEROBIC AND ANAEROBIC  1CC  Final   Culture NO GROWTH 2 DAYS  Final   Report Status PENDING  Incomplete    RADIOLOGY:  No results found.    Management plans discussed with the patient, family and they are in agreement.  CODE STATUS:  Code Status History    Date Active Date Inactive Code Status Order ID Comments User Context   10/05/2015  5:40 PM 10/09/2015  2:52 PM Full Code [id number]  Clayburn Pert, MD Inpatient      TOTAL TIME TAKING CARE OF THIS PATIENT: 40 minutes.    Henreitta Leber M.D on 10/09/2015 at 4:25 PM  Between 7am to 6pm - Pager - [phone]  After 6pm go to [email or web address] - password [email or web address] [place] [facility]  Office  [phone]  CC: Primary care physician; Pcp Not In System

## 2015-10-09 NOTE — Procedures (Signed)
[facility].  Will call report to the floor.  Pt right radial site dry and intact.  No voiced complaints. VSS

## 2015-10-09 NOTE — Progress Notes (Signed)
ANTICOAGULATION CONSULT NOTE - Follow Up Consult  Pharmacy Consult for heparin Indication: chest pain/ACS  No Known Allergies  Patient Measurements: Wt= 78kg  Vital Signs: Temp: 98 F (36.7 C) (02/13 1045) Temp Source: Oral (02/13 0707) BP: 138/74 mmHg (02/13 1110) Pulse Rate: 107 (02/13 1110)  Labs:  Recent Labs  10/06/15 1731 10/06/15 2331  10/07/15 0229  10/08/15 0635 10/08/15 1654 10/09/15 0005 10/09/15 0009  HGB  --   --   < > 10.5*  --  9.4*  --   --  9.1*  HCT  --   --   --  30.2*  --  27.1*  --   --  26.0*  PLT  --   --   --  98*  --  103*  --   --  111*  LABPROT  --   --   --   --   --   --   --  16.2*  --   INR  --   --   --   --   --   --   --  1.29  --   HEPARINUNFRC 0.22*  --   --  0.29*  < > 0.28* 0.40 0.32  --   CREATININE  --   --   --   --   --   --   --  0.86  --   TROPONINI 11.27* 7.56*  --   --   --   --   --   --   --   < > = values in this interval not displayed.  Estimated Creatinine Clearance: 83.1 mL/min (by C-G formula based on Cr of 0.86).   Medications:  Scheduled:  . feeding supplement  237 mL Oral TID WC  . ferrous [id number] C-folic acid  1 capsule Oral TID PC  . furosemide  40 mg Intravenous Once  . metoprolol tartrate  50 mg Oral BID  . pantoprazole  40 mg Oral Q1200  . piperacillin-tazobactam (ZOSYN)  IV  3.375 g Intravenous 4 times per day  . potassium chloride  20 mEq Oral BID    Assessment: 75 yo male here from [place] with NSTEMI. He is now s/p cath with multivessel CAD and to be evaluated for CABG.  Heparin to resume 8 hours post sheath removal (removed at about 8:30am and TR band placed) -last heparin rate was 1550 units/hr with heparin level= 0.32 this am   Goal of Therapy:  Heparin level 0.3-0.7 units/ml Monitor platelets by anticoagulation protocol: Yes   Plan:  -Restart heparin at 4:30pm today -Heparin level in 8 hours and daily wth CBC daily  Hildred Laser, Pharm D 10/09/2015 1:48 PM

## 2015-10-09 NOTE — Progress Notes (Signed)
Per [facility] RN, patient will be leaving from special recovery area to [facility] to [facility] for procedure. Patient's family was in the waiting room and are aware of transfer per [facility] RN.

## 2015-10-09 NOTE — Progress Notes (Signed)
Pt potassium resulted at 2.9. MD Dr. Jannifer Franklin notified. Orders given for 4 runs of IV potassium. RN will administer and continue to monitor. Rachael Fee, RN

## 2015-10-10 ENCOUNTER — Inpatient Hospital Stay (HOSPITAL_COMMUNITY): Payer: Medicare Other

## 2015-10-10 ENCOUNTER — Encounter (HOSPITAL_COMMUNITY): Payer: Self-pay

## 2015-10-10 DIAGNOSIS — J189 Pneumonia, unspecified organism: Secondary | ICD-10-CM | POA: Diagnosis present

## 2015-10-10 DIAGNOSIS — A419 Sepsis, unspecified organism: Secondary | ICD-10-CM

## 2015-10-10 DIAGNOSIS — J181 Lobar pneumonia, unspecified organism: Secondary | ICD-10-CM

## 2015-10-10 DIAGNOSIS — B961 Klebsiella pneumoniae [K. pneumoniae] as the cause of diseases classified elsewhere: Secondary | ICD-10-CM

## 2015-10-10 DIAGNOSIS — I214 Non-ST elevation (NSTEMI) myocardial infarction: Secondary | ICD-10-CM

## 2015-10-10 DIAGNOSIS — J90 Pleural effusion, not elsewhere classified: Secondary | ICD-10-CM | POA: Clinically undetermined

## 2015-10-10 DIAGNOSIS — R7881 Bacteremia: Secondary | ICD-10-CM

## 2015-10-10 DIAGNOSIS — I2510 Atherosclerotic heart disease of native coronary artery without angina pectoris: Secondary | ICD-10-CM

## 2015-10-10 DIAGNOSIS — R000 Tachycardia, unspecified: Secondary | ICD-10-CM

## 2015-10-10 HISTORY — DX: Atherosclerotic heart disease of native coronary artery without angina pectoris: I25.10

## 2015-10-10 LAB — BLOOD GAS, ARTERIAL
Acid-base deficit: 4.2 mmol/L — ABNORMAL HIGH (ref 0.0–2.0)
Bicarbonate: 18.8 mEq/L — ABNORMAL LOW (ref 20.0–24.0)
DRAWN BY: 301361
FIO2: 0.24
O2 Saturation: 97.9 %
PATIENT TEMPERATURE: 98.6
PCO2 ART: 26.1 mmHg — AB (ref 35.0–45.0)
TCO2: 19.6 mmol/L (ref 0–100)
pH, Arterial: 7.472 — ABNORMAL HIGH (ref 7.350–7.450)
pO2, Arterial: 104 mmHg — ABNORMAL HIGH (ref 80.0–100.0)

## 2015-10-10 LAB — PROTIME-INR
INR: 1.34 (ref 0.00–1.49)
Prothrombin Time: 16.7 seconds — ABNORMAL HIGH (ref 11.6–15.2)

## 2015-10-10 LAB — CBC
HCT: 27.1 % — ABNORMAL LOW (ref 39.0–52.0)
Hemoglobin: 9.7 g/dL — ABNORMAL LOW (ref 13.0–17.0)
MCH: 33 pg (ref 26.0–34.0)
MCHC: 35.8 g/dL (ref 30.0–36.0)
MCV: 92.2 fL (ref 78.0–100.0)
Platelets: 136 10*3/uL — ABNORMAL LOW (ref 150–400)
RBC: 2.94 MIL/uL — ABNORMAL LOW (ref 4.22–5.81)
RDW: 14.3 % (ref 11.5–15.5)
WBC: 7.5 10*3/uL (ref 4.0–10.5)

## 2015-10-10 LAB — PREALBUMIN: Prealbumin: 7.2 mg/dL — ABNORMAL LOW (ref 18–38)

## 2015-10-10 LAB — COMPREHENSIVE METABOLIC PANEL
ALBUMIN: 2.2 g/dL — AB (ref 3.5–5.0)
ALK PHOS: 59 U/L (ref 38–126)
ALT: 41 U/L (ref 17–63)
ANION GAP: 13 (ref 5–15)
AST: 34 U/L (ref 15–41)
BUN: 10 mg/dL (ref 6–20)
CALCIUM: 8.1 mg/dL — AB (ref 8.9–10.3)
CHLORIDE: 104 mmol/L (ref 101–111)
CO2: 19 mmol/L — AB (ref 22–32)
Creatinine, Ser: 0.9 mg/dL (ref 0.61–1.24)
GFR calc Af Amer: >60 mL/min (ref >60)
GFR calc non Af Amer: >60 mL/min (ref >60)
GLUCOSE: 101 mg/dL — AB (ref 65–99)
Potassium: 3.3 mmol/L — ABNORMAL LOW (ref 3.5–5.1)
SODIUM: 136 mmol/L (ref 135–145)
Total Bilirubin: 1.3 mg/dL — ABNORMAL HIGH (ref 0.3–1.2)
Total Protein: 5.8 g/dL — ABNORMAL LOW (ref 6.5–8.1)

## 2015-10-10 LAB — AMYLASE: Amylase: 442 U/L — ABNORMAL HIGH (ref 28–100)

## 2015-10-10 LAB — HEPARIN LEVEL (UNFRACTIONATED)
HEPARIN UNFRACTIONATED: 0.45 [IU]/mL (ref 0.30–0.70)
Heparin Unfractionated: <0.1 IU/mL — ABNORMAL LOW (ref 0.30–0.70)
Heparin Unfractionated: 0.26 IU/mL — ABNORMAL LOW (ref 0.30–0.70)

## 2015-10-10 LAB — MAGNESIUM: MAGNESIUM: 2.2 mg/dL (ref 1.7–2.4)

## 2015-10-10 MED ORDER — LIDOCAINE HCL (PF) 1 % IJ SOLN
INTRAMUSCULAR | Status: AC
  Administered 2015-10-10: 18:00:00
  Filled 2015-10-10: qty 10

## 2015-10-10 MED ORDER — MIRTAZAPINE 15 MG PO TABS
15.0000 mg | ORAL_TABLET | Freq: Every day | ORAL | Status: DC
  Administered 2015-10-10 – 2015-10-12 (×3): 15 mg via ORAL
  Filled 2015-10-10 (×5): qty 1

## 2015-10-10 MED ORDER — CLONAZEPAM 0.5 MG PO TABS: 0.2500 mg | ORAL_TABLET | Freq: Three times a day (TID) | ORAL | Status: DC | PRN

## 2015-10-10 MED ORDER — ACETAMINOPHEN 325 MG PO TABS: 650.0000 mg | ORAL_TABLET | Freq: Four times a day (QID) | ORAL | Status: DC | PRN

## 2015-10-10 MED ORDER — ALPRAZOLAM 0.25 MG PO TABS
0.2500 mg | ORAL_TABLET | Freq: Two times a day (BID) | ORAL | Status: DC | PRN
  Administered 2015-10-12: 0.25 mg via ORAL
  Filled 2015-10-10: qty 1

## 2015-10-10 MED ORDER — PIPERACILLIN-TAZOBACTAM 3.375 G IVPB
3.3750 g | Freq: Three times a day (TID) | INTRAVENOUS | Status: DC
  Administered 2015-10-11 – 2015-10-13 (×7): 3.375 g via INTRAVENOUS
  Filled 2015-10-10 (×9): qty 50

## 2015-10-10 MED ORDER — OXYCODONE HCL 5 MG PO TABS
5.0000 mg | ORAL_TABLET | Freq: Four times a day (QID) | ORAL | Status: DC | PRN
  Administered 2015-10-10 – 2015-10-12 (×3): 5 mg via ORAL
  Filled 2015-10-10 (×3): qty 1

## 2015-10-10 NOTE — Care Management Note (Signed)
Case Management Note Marvetta Gibbons RN, BSN Unit 2W-Case Manager [phone]  Patient Details  Name: Preston Bell MRN: [id number] Date of Birth: Jan 02, 1941  Subjective/Objective:    Pt admitted to [facility]- tx to [facility] on 2/13- post cath demonstrating LM CAD with severe RCA disease. He was admitted with Acute Cholecystitis/pancreatitis - Perc Tube. Severe sepsis - Klebsiella bacteremia. Cath for + Troponin. Now CXR suggestive of RLL PNA-- work up being done for CABG                 Action/Plan: PTA pt lived at home - CM to follow for d/c needs as pt progresses   Expected Discharge Date:                  Expected Discharge Plan:  [place]  In-House Referral:     Discharge planning Services  CM Consult  Post Acute Care Choice:    Choice offered to:     DME Arranged:    DME Agency:     HH Arranged:    [place] Agency:     Status of Service:  In process, will continue to follow  Medicare Important Message Given:    Date Medicare IM Given:    Medicare IM give by:    Date Additional Medicare IM Given:    Additional Medicare Important Message give by:     If discussed at [place] of Stay Meetings, dates discussed:    Additional Comments:  Dawayne Patricia, RN 10/10/2015, 12:43 PM

## 2015-10-10 NOTE — Progress Notes (Signed)
ANTICOAGULATION CONSULT NOTE - Follow Up Consult  Pharmacy Consult for Heparin  Indication: chest pain/ACS, CABG after clinical improvement   No Known Allergies  Patient Measurements: Height: 6\' 2"  (188 cm) Weight: 169 lb 12.1 oz (77 kg) IBW/kg (Calculated) : 82.2  Vital Signs: Temp: 98.2 F (36.8 C) (02/14 2005) Temp Source: Oral (02/14 2005) BP: 119/84 mmHg (02/14 2005) Pulse Rate: 105 (02/14 1219)  Labs:  Recent Labs  10/08/15 0635  10/09/15 0005 10/09/15 0009 10/10/15 0043 10/10/15 0600 10/10/15 1056 10/10/15 2231  HGB 9.4*  --   --  9.1*  --  9.7*  --   --   HCT 27.1*  --   --  26.0*  --  27.1*  --   --   PLT 103*  --   --  111*  --  136*  --   --   LABPROT  --   --  16.2*  --   --  16.7*  --   --   INR  --   --  1.29  --   --  1.34  --   --   HEPARINUNFRC 0.28*  < > 0.32  --  <0.10*  --  0.26* 0.45  CREATININE  --   --  0.86  --   --  0.90  --   --   < > = values in this interval not displayed.  Estimated Creatinine Clearance: 78.4 mL/min (by C-G formula based on Cr of 0.9).   Assessment: S/P cath awaiting CABG after clinical improvement, HL is therapeutic x 1 after rate increase  Goal of Therapy:  Heparin level 0.3-0.7 units/ml Monitor platelets by anticoagulation protocol: Yes   Plan:  -Continue heparin at 1950 units/hr -Confirmatory HL with AM labs  [name] 10/10/2015,11:39 PM

## 2015-10-10 NOTE — Progress Notes (Signed)
ANTICOAGULATION CONSULT NOTE - Follow Up Consult  Pharmacy Consult for heparin Indication: chest pain/ACS  No Known Allergies  Patient Measurements: Wt= 78kg  Vital Signs: Temp: 99.5 F (37.5 C) (02/13 2102) Temp Source: Oral (02/13 2102) BP: 139/64 mmHg (02/13 2102) Pulse Rate: 104 (02/13 2102)  Labs:  Recent Labs  10/08/15 0635 10/08/15 1654 10/09/15 0005 10/09/15 0009 10/10/15 0043  HGB 9.4*  --   --  9.1*  --   HCT 27.1*  --   --  26.0*  --   PLT 103*  --   --  111*  --   LABPROT  --   --  16.2*  --   --   INR  --   --  1.29  --   --   HEPARINUNFRC 0.28* 0.40 0.32  --  <0.10*  CREATININE  --   --  0.86  --   --     Estimated Creatinine Clearance: 83.1 mL/min (by C-G formula based on Cr of 0.86).  Assessment: 75 yo male here from [place] with NSTEMI. s/p cath 2/13 with multivessel CAD and to be evaluated for CABG. Heparin resumed 8 hours post sheath removal. Heparin level undetectable on 1550 units/hr although had previously been low-therapeutic on this rate. No issues with line or bleeding reported per RN.  Goal of Therapy:  Heparin level 0.3-0.7 units/ml Monitor platelets by anticoagulation protocol: Yes   Plan:  Increase heparin gtt to 1800 units/hr F/u 8 hr heparin level  Sherlon Handing, PharmD, BCPS Clinical pharmacist, pager [phone]  10/10/2015 3:12 AM

## 2015-10-10 NOTE — Progress Notes (Signed)
After 1st shift R.N. Call report. Daryl R.N. And my self transfer patient to 2h19 with O2 and monitor and his belongings. Patient alert and orin. With no complaints at this time

## 2015-10-10 NOTE — Progress Notes (Signed)
ANTICOAGULATION CONSULT NOTE - Follow Up Consult  Pharmacy Consult for heparin Indication: chest pain/ACS  No Known Allergies  Patient Measurements: Wt= 78kg  Vital Signs: Temp: 98.3 F (36.8 C) (02/14 1219) Temp Source: Oral (02/14 1219) BP: 139/57 mmHg (02/14 1219) Pulse Rate: 105 (02/14 1219)  Labs:  Recent Labs  10/08/15 0635  10/09/15 0005 10/09/15 0009 10/10/15 0043 10/10/15 0600 10/10/15 1056  HGB 9.4*  --   --  9.1*  --  9.7*  --   HCT 27.1*  --   --  26.0*  --  27.1*  --   PLT 103*  --   --  111*  --  136*  --   LABPROT  --   --  16.2*  --   --  16.7*  --   INR  --   --  1.29  --   --  1.34  --   HEPARINUNFRC 0.28*  < > 0.32  --  <0.10*  --  0.26*  CREATININE  --   --  0.86  --   --  0.90  --   < > = values in this interval not displayed.  Estimated Creatinine Clearance: 79.4 mL/min (by C-G formula based on Cr of 0.9).  Assessment: 75 yo male here from [place] with NSTEMI. s/p cath 2/13 with multivessel CAD for CABG on heparin . He is also noted with acute cholecystitis/pancreatitis and PNA on antibiotics. Heparin level is below goal (HL= 0.26 on 1800 units/hr)  Goal of Therapy:  Heparin level 0.3-0.7 units/ml Monitor platelets by anticoagulation protocol: Yes   Plan:  -Increase heparin gtt to 1950 units/hr -Heparin level in 8 hours and daily wth CBC daily  Hildred Laser, Pharm D 10/10/2015 1:22 PM

## 2015-10-10 NOTE — Progress Notes (Signed)
Patient: Preston Bell / Admit Date: 10/09/2015 / Date of Encounter: 10/10/2015, 12:22 PM  Transferred to [facility] from [facility] post cath demonstrating LM CAD with severe RCA disease. He was admitted with Acute Cholecystitis/pancreatitis - Perc Tube.  Severe sepsis - Klebsiella bacteremia. Cath for + Troponin. Now CXR suggestive of RLL PNA   Subjective: Anxious this AM.  RUQ pain persists.  + cough ,but not overly productive. Denies any CP although notes rapid HR. Amylase remains elevated.  Review of Systems: Review of Systems  Constitutional: Positive for weight loss.  Respiratory: Positive for cough and shortness of breath.   Cardiovascular: Negative.   Gastrointestinal: Positive for nausea and abdominal pain.  Musculoskeletal: Negative.   Neurological: Positive for dizziness.  Psychiatric/Behavioral: The patient is nervous/anxious.   All other systems reviewed and are negative.   Objective: Telemetry: NSR, no significant arrhythmia Physical Exam: Blood pressure 139/57, pulse 105, temperature 98.3 F (36.8 C), temperature source Oral, resp. rate 19, height 6\' 2"  (1.88 m), weight 171 lb 15.3 oz (78 kg), SpO2 97 %. Body mass index is 22.07 kg/(m^2). General: Overall ill-appearing - thin and frail, nontoxic, but mostly anxious. Head: Normocephalic, atraumatic, sclera non-icteric, no xanthomas, nares are without discharge. Neck: Negative for carotid bruits. JVP not elevated. Lungs: Somewhat tachypneic with decreased breath sounds in the right base. Dull to percussion, but otherwise clear. Nonlabored besides tachypnea. Heart: Tachycardic with regular rhythm, S1 S2 without murmurs, rubs, or gallops.  Abdomen: Soft, non-tender, non-distended with normoactive bowel sounds.  Extremities: No clubbing or cyanosis. No edema. Distal pedal pulses are 2+ and equal bilaterally. Neuro: Alert and oriented X 3. Moves all extremities spontaneously. Psych:  Responds to questions appropriately with a  normal affect.   Intake/Output Summary (Last 24 hours) at 10/10/15 1222 Last data filed at 10/10/15 0438  Gross per 24 hour  Intake 1641.67 ml  Output   2725 ml  Net -1083.33 ml    Inpatient Medications:  . aspirin  81 mg Oral Daily  . atorvastatin  40 mg Oral q1800  . feeding supplement  237 mL Oral TID WC  . ferrous Q000111Q C-folic acid  1 capsule Oral TID PC  . metoprolol tartrate  50 mg Oral BID  . pantoprazole  40 mg Oral Q1200  . piperacillin-tazobactam (ZOSYN)  IV  3.375 g Intravenous 4 times per day  . potassium chloride  20 mEq Oral BID   Infusions:  . heparin 1,800 Units/hr (10/10/15 0317)    Labs:  Recent Labs  10/09/15 0005 10/10/15 0600  NA 137 136  K 2.9* 3.3*  CL 108 104  CO2 22 19*  GLUCOSE 89 101*  BUN 14 10  CREATININE 0.86 0.90  CALCIUM 7.8* 8.1*  MG  --  2.2    Recent Labs  10/10/15 0600  AST 34  ALT 41  ALKPHOS 59  BILITOT 1.3*  PROT 5.8*  ALBUMIN 2.2*    Recent Labs  10/09/15 0009 10/10/15 0600  WBC 6.9 7.5  HGB 9.1* 9.7*  HCT 26.0* 27.1*  MCV 93.1 92.2  PLT 111* 136*   No results for input(s): CKTOTAL, CKMB, TROPONINI in the last 72 hours. Invalid input(s): POCBNP No results for input(s): HGBA1C in the last 72 hours.   Weights: Filed Weights   10/09/15 1200  Weight: 171 lb 15.3 oz (78 kg)    Radiology/Studies:  Chest x-ray today shows probable right lower lobe pneumonia with parapneumonic effusion and no definitive pulmonary edema.  Cardiac  Cath 2013 2017:    Conclusion     LM lesion, 90% stenosed.  Ost LAD lesion, 70% stenosed.  Ost 2nd Diag to 2nd Diag lesion, 90% stenosed.  Dist LAD lesion, 40% stenosed.  Ost Cx to Prox Cx lesion, 80% stenosed.  The left ventricular systolic function is normal.  Mid RCA lesion, 95% stenosed.  1. Severe distal left main and mid RCA stenosis. 2. Normal LV systolic function. 3. Severely elevated left ventricular end-diastolic  pressure.  Recommendations: Recommend CABG at [facility]. The patient has been getting 75 mL of fluid. I discontinued IV fluids. He might require some gentle diuresis. Resume heparin 8 hours after sheath pulled.     Assessment and Plan  75 y.o. male  Active Problems:   Cholecystitis   Sepsis ([place])   NSTEMI (non-ST elevated myocardial infarction) ([place])   Left main coronary artery disease   Right lower lobe pneumonia   Bacteremia due to Klebsiella pneumoniae   CAD, multiple vessel   Vomiting and diarrhea   Elevated Troponin/ non-STEMI with severe CAD -   troponin 19.47 at peak consistent with NSTEMI - severe multivessel disease with left main, LAD, circumflex and RCA disease  Restarted IV heparin infusion post cath.  On statin and beta blocker and aspirin  Additional Lopressor IV given today for tachycardia. If he tolerates this, we can increase his Lopressor to 75 twice a day.  HTN - maintaining normal to mildly elevated pressures despite sepsis - continue current treatment -- at present, we have room for additional beta blocker if needed Ranging from [id number] up to [id number] systolic on average  HLD- continue statin therapy, on Lipitor 40 mg daily  Acute Cholecystitis/ pancreatitis  - CT of the abdomen showed acute cholecystitis.  - presented with nausea, vomiting, and diarrhea.  current high-surgical risk. - Even higher now with known severe CAD   IR was consulted and a percutaneous cholecystostomy with placement of a drainage catheter was placed into the gallbladder lumen. - He is tachycardic. I think some of this is related to sepsis but otherwise also related to anxiety. - Need to treat the underlying cause and not mask sepsis related tachycardia, however with his severe coronary disease I would like to try to keep his heart rate close to 100 than 120. - As blood pressure tolerates, we may titrate up beta blocker.  Sepsis - combination of cholecystitis, pancreatitis and now  pneumonia.  On antibiotics per medicine service  With the significant tachycardia and his somewhat unsteady nature, I agree with transfer to stepdown unit. Would prefer 2H   Dr. Prescott Gum from CT surgery has seen the patient. He deathly requires multivessel CABG, but will need to be much more stable from a sepsis, cholecystitis/pancreatic otitis and pneumonia standpoint. Amylase will need to be significantly reduced.  Cardiology will follow along. Besides sepsis related tachycardia, he is relatively asymmetric cardiac standpoint for now, however he would not tolerate prolonged tachycardia or hypotension.  Judicious use of beta blocker to avoid hypotension.   Signed, Leonie Man, M.D., M.S. Interventional Cardiologist   Pager # [phone] Phone # [phone] [address]. [place], [place] [address]

## 2015-10-10 NOTE — Progress Notes (Addendum)
Procedure(s) (LRB): CORONARY ARTERY BYPASS GRAFTING (CABG) (N/A) TRANSESOPHAGEAL ECHOCARDIOGRAM (TEE) (N/A) Subjective: Non-ST elevation MI associated with severe sepsis and acute cholecystitis, pancreatitis with amylase 442  Patient's abdominal pain improved however amylase is  440 Patient on IV antibiotics to give at least 7 days of coverage for Klebsiella bacteremia prior to CABG Objective: Vital signs in last 24 hours: Temp:  [98.6 F (37 C)-99.5 F (37.5 C)] 98.6 F (37 C) (02/14 0438) Pulse Rate:  [99-126] 121 (02/14 0935) Cardiac Rhythm:  [-] Sinus tachycardia (02/14 0700) Resp:  [18-20] 18 (02/14 0438) BP: (139-152)/(59-75) 148/63 mmHg (02/14 1100) SpO2:  [95 %-96 %] 95 % (02/14 0935)  Hemodynamic parameters for last 24 hours:   sinus rhythm afebrile  Intake/Output from previous day: 02/13 0701 - 02/14 0700 In: 1641.7 [P.O.:600; I.V.:191.7; IV Piggyback:850] Out: 2725 [Urine:2500; Drains:225] Intake/Output this shift:        Exam    General- alert and comfortable   Lungs- clear without rales, wheezes   Cor- regular rate and rhythm, no murmur , gallop   Abdomen- soft, non-tender   Extremities - warm, non-tender, minimal edema   Neuro- oriented, appropriate, no focal weakness    Lab Results:  Recent Labs  10/09/15 0009 10/10/15 0600  WBC 6.9 7.5  HGB 9.1* 9.7*  HCT 26.0* 27.1*  PLT 111* 136*   BMET:  Recent Labs  10/09/15 0005 10/10/15 0600  NA 137 136  K 2.9* 3.3*  CL 108 104  CO2 22 19*  GLUCOSE 89 101*  BUN 14 10  CREATININE 0.86 0.90  CALCIUM 7.8* 8.1*    PT/INR:  Recent Labs  10/10/15 0600  LABPROT 16.7*  INR 1.34   ABG No results found for: PHART, HCO3, TCO2, ACIDBASEDEF, O2SAT CBG (last 3)  No results for input(s): GLUCAP in the last 72 hours.  Assessment/Plan: S/P Procedure(s) (LRB): CORONARY ARTERY BYPASS GRAFTING (CABG) (N/A) TRANSESOPHAGEAL ECHOCARDIOGRAM (TEE) (N/A) Patient remains tachycardic with low bicarbonate  level today and significant elevation of amylase. Prior to CABG the amylase will need to significantly improve.  Large R pleural effusion prob related to abdominal sepsis- will ask IR to tap and culture  CABG when he improves- will follow   LOS: 1 day    Preston Bell 10/10/2015

## 2015-10-10 NOTE — Procedures (Signed)
Successful US guided right thoracentesis. Yielded 561mL of hazy, amber colored fluid. Pt tolerated procedure well. No immediate complications.  Specimen was sent for culture as requested. CXR ordered.  Ascencion Dike PA-C 10/10/2015 3:55 PM

## 2015-10-10 NOTE — Progress Notes (Signed)
Utilization review completed.  

## 2015-10-10 NOTE — Progress Notes (Signed)
VASCULAR LAB PRELIMINARY  PRELIMINARY  PRELIMINARY  PRELIMINARY  Pre-op Cardiac Surgery  Carotid Findings:  Bilateral :  40-59% internal carotid artery stenosis.    Upper Extremity Right Left  Brachial Pressures 155 Triphasic 151 Triphasic  Radial Waveforms Triphasic Triphasic  Ulnar Waveforms Triphasic Triphasic  Palmar Arch (Allen's Test) Abnormal Normal   Findings:  Right Doppler waveforms remained normal with radial compression and diminished greater than 50% with ulnar compression. Left Doppler waveforms remained normal with both radial and ulnar compressions.    Lower  Extremity Right Left  Dorsalis Pedis 156 Triphasic 167 Triphasic  Posterior Tibial 160 Triphasic 148 Triphasic  Ankle/Brachial Indices 1.03 1.08    Findings:  ABIs and Doppler waveforms are normal bilaterally at rest   Twain Stenseth, RVS 10/10/2015, 12:29 PM

## 2015-10-10 NOTE — Progress Notes (Signed)
PROGRESS NOTE  Preston Bell [id number] DOB: 25-Jan-1941 DOA: 10/09/2015 PCP: Pcp Not In System  HPI/Recap of past 24 hours:  Significant sinus tachycardia, reported productive cough and feeling sob, family in room  Assessment/Plan: Active Problems:   Cholecystitis   Sepsis ([place])   NSTEMI (non-ST elevated myocardial infarction) ([place])   Vomiting and diarrhea  1. Non-STEMI. Incidentally found during hospital workup for cholecystitis. Cardiac catheterization showing 90% lesion of left main and 95% in mid RCA . Patient is completely symptom free, continue heparin drip, continue beta blocker, add low-dose aspirin and statin for secondary prevention, cardiology and cardiothoracic surgery following, as well. Per Dr. Lucianne Lei tright patient to undergo CABG in the next 5-6 days once stable from bacteremia standpoint.  2. Acute cholecystitis complicated with sepsis , Klebsiella bacteremia.  Sensitivities pending. Currently on IV Zosyn, cholecystectomy drain placed at [place], , reported poor oral intake but symptom largely controlled with  prn analgesics and antiemetics. Outpatient follow-up with surgery post CABG.  3. pna with pleural effusion: s/p US guided thoracentesis on 2/14, culture pending, continue abx  4. SVT/sinus tachycardia. Initially responded well to Adenosine/Cardizem at [facility], currently on beta blocker, IV Lopressor order did well, heart rate high upon admission, tachycardia from sepsis +/-anxiety. Cardiology following  5. GERD. On PPI.  6. Dyslipidemia. Resume statin.  7. Essential hypertension. Currently on beta blocker will monitor.  8. Hypokalemia. Replace, keep k>4, mag >2.  9. History of colon cancer, abdominal wall hernia, remote history of smoking and alcohol abuse. Outpatient follow-up with PCP and general surgery as needed.    DVT prophylaxis: on  Heparin drip  Code Status: full  Family Communication: patient and family (wife and daughter in  room)  Disposition Plan: transfer to [facility]   Consultants:  Cardiology  CT surgery  Procedures: cholecystectomy drain placed at [place] US guided thoracentesis on 2/14 at [place]  Antibiotics:  Zosyn started at on admission (he was on rocephin at [place])   Objective: BP 148/63 mmHg  Pulse 121  Temp(Src) 98.6 F (37 C) (Oral)  Resp 18  Ht 6\' 2"  (1.88 m)  Wt 78 kg (171 lb 15.3 oz)  BMI 22.07 kg/m2  SpO2 95%  Intake/Output Summary (Last 24 hours) at 10/10/15 1150 Last data filed at 10/10/15 0438  Gross per 24 hour  Intake 1641.67 ml  Output   2725 ml  Net -1083.33 ml   Filed Weights   10/09/15 1200  Weight: 78 kg (171 lb 15.3 oz)    Exam:   General:  Frail, but NAD  Cardiovascular: sinus tachcardia  Respiratory: diminished right lower lobe, no wheezing, no rhonchi  Abdomen: RUQ GB drain in place, large LLQ abdominal wall hernia.  positive BS, no significant tenderness  Musculoskeletal: No Edema  Neuro: aaox3  Data Reviewed: Basic Metabolic Panel:  Recent Labs Lab 10/05/15 0839 10/05/15 2141 10/06/15 0303 10/09/15 0005 10/10/15 0600  NA 141 136 136 137 136  K 3.6 4.1 3.8 2.9* 3.3*  CL 104 105 103 108 104  CO2 16* 24 27 22  19*  GLUCOSE 216* 151* 125* 89 101*  BUN 21* 19 18 14 10   CREATININE 1.51* 1.07 0.99 0.86 0.90  CALCIUM 9.8 7.9* 8.1* 7.8* 8.1*  MG  --  1.1* 2.4  --  2.2  PHOS  --   --  2.7  --   --    Liver Function Tests:  Recent Labs Lab 10/05/15 0839 10/06/15 0303 10/10/15 0600  AST 190* 174*  34  ALT 173* 103* 41  ALKPHOS 125 58 59  BILITOT 1.9* 2.2* 1.3*  PROT 8.4* 5.9* 5.8*  ALBUMIN 4.8 3.2* 2.2*    Recent Labs Lab 10/05/15 0839 10/10/15 0600  LIPASE 366*  --   AMYLASE  --  442*   No results for input(s): AMMONIA in the last 168 hours. CBC:  Recent Labs Lab 10/05/15 0839 10/06/15 0303 10/07/15 0229 10/08/15 0635 10/09/15 0009 10/10/15 0600  WBC 23.1* 11.3* 9.1 7.6 6.9 7.5  NEUTROABS 21.6*  --    --   --   --   --   HGB 13.2 10.8* 10.5* 9.4* 9.1* 9.7*  HCT 39.3* 31.4* 30.2* 27.1* 26.0* 27.1*  MCV 96.4 94.2 93.6 94.4 93.1 92.2  PLT 238 115* 98* 103* 111* 136*   Cardiac Enzymes:    Recent Labs Lab 10/05/15 2141 10/06/15 0303 10/06/15 1119 10/06/15 1731 10/06/15 2331  TROPONINI 13.17* 19.47* 13.95* 11.27* 7.56*   BNP (last 3 results) No results for input(s): BNP in the last 8760 hours.  ProBNP (last 3 results) No results for input(s): PROBNP in the last 8760 hours.  CBG:  Recent Labs Lab 10/05/15 1731  GLUCAP 124*    Recent Results (from the past 240 hour(s))  Blood culture (routine x 2)     Status: None   Collection Time: 10/05/15  9:06 AM  Result Value Ref Range Status   Specimen Description BLOOD RIGHT IV  Final   Special Requests   Final    BOTTLES DRAWN AEROBIC AND ANAEROBIC AER 4ML ANA 1ML   Culture  Setup Time   Final    GRAM NEGATIVE RODS IN BOTH AEROBIC AND ANAEROBIC BOTTLES CRITICAL RESULT CALLED TO, READ BACK BY AND VERIFIED WITH: MATT MCBANE [id number] 10/05/15 MLZ     Culture   Final    KLEBSIELLA PNEUMONIAE IN BOTH AEROBIC AND ANAEROBIC BOTTLES    Report Status 10/07/2015 FINAL  Final   Organism ID, Bacteria KLEBSIELLA PNEUMONIAE  Final      Susceptibility   Klebsiella pneumoniae - MIC*    AMPICILLIN Value in next row Resistant      RESISTANT>=32    CEFAZOLIN Value in next row Sensitive      SENSITIVE<=4    CEFEPIME Value in next row Sensitive      SENSITIVE<=1    CEFTAZIDIME Value in next row Sensitive      SENSITIVE<=1    CEFTRIAXONE Value in next row Sensitive      SENSITIVE<=1    CIPROFLOXACIN Value in next row Sensitive      SENSITIVE<=0.25    GENTAMICIN Value in next row Sensitive      SENSITIVE<=1    IMIPENEM Value in next row Sensitive      SENSITIVE<=0.25    TRIMETH/SULFA Value in next row Sensitive      SENSITIVE<=20    AMPICILLIN/SULBACTAM Value in next row Sensitive      SENSITIVE8    PIP/TAZO Value in next row Sensitive       SENSITIVE<=4    * KLEBSIELLA PNEUMONIAE  Blood Culture ID Panel (Reflexed)     Status: Abnormal   Collection Time: 10/05/15  9:06 AM  Result Value Ref Range Status   Enterococcus species NOT DETECTED NOT DETECTED Final   Listeria monocytogenes NOT DETECTED NOT DETECTED Final   Staphylococcus species NOT DETECTED NOT DETECTED Final   Staphylococcus aureus NOT DETECTED NOT DETECTED Final   Streptococcus species NOT DETECTED NOT DETECTED Final   Streptococcus  agalactiae NOT DETECTED NOT DETECTED Final   Streptococcus pneumoniae NOT DETECTED NOT DETECTED Final   Streptococcus pyogenes NOT DETECTED NOT DETECTED Final   Acinetobacter baumannii NOT DETECTED NOT DETECTED Final   Enterobacteriaceae species DETECTED (A) NOT DETECTED Final    Comment: CALLED TO MATT MCBANE AT 2255 10/05/15 MLZ    Enterobacter cloacae complex NOT DETECTED NOT DETECTED Final   Escherichia coli NOT DETECTED NOT DETECTED Final   Klebsiella oxytoca NOT DETECTED NOT DETECTED Final   Klebsiella pneumoniae DETECTED (A) NOT DETECTED Final    Comment: CALLED TO MATT MCBANE AT 2255 10/05/15 MLZ    Proteus species NOT DETECTED NOT DETECTED Final   Serratia marcescens NOT DETECTED NOT DETECTED Final   Haemophilus influenzae NOT DETECTED NOT DETECTED Final   Neisseria meningitidis NOT DETECTED NOT DETECTED Final   Pseudomonas aeruginosa NOT DETECTED NOT DETECTED Final   Candida albicans NOT DETECTED NOT DETECTED Final   Candida glabrata NOT DETECTED NOT DETECTED Final   Candida krusei NOT DETECTED NOT DETECTED Final   Candida parapsilosis NOT DETECTED NOT DETECTED Final   Candida tropicalis NOT DETECTED NOT DETECTED Final   Carbapenem resistance NOT DETECTED NOT DETECTED Final   Methicillin resistance NOT DETECTED NOT DETECTED Final   Vancomycin resistance NOT DETECTED NOT DETECTED Final  CULTURE, BLOOD (ROUTINE X 2) w Reflex to PCR ID Panel     Status: None (Preliminary result)   Collection Time: 10/07/15 11:34 AM   Result Value Ref Range Status   Specimen Description BLOOD RIGHT ANTECUBITAL  Final   Special Requests   Final    BOTTLES DRAWN AEROBIC AND ANAEROBIC  AER 4CC ANA 1CC   Culture NO GROWTH 3 DAYS  Final   Report Status PENDING  Incomplete  CULTURE, BLOOD (ROUTINE X 2) w Reflex to PCR ID Panel     Status: None (Preliminary result)   Collection Time: 10/07/15 11:34 AM  Result Value Ref Range Status   Specimen Description BLOOD RIGHT HAND  Final   Special Requests BOTTLES DRAWN AEROBIC AND ANAEROBIC  1CC  Final   Culture NO GROWTH 3 DAYS  Final   Report Status PENDING  Incomplete  Surgical pcr screen     Status: None   Collection Time: 10/09/15  1:39 PM  Result Value Ref Range Status   MRSA, PCR NEGATIVE NEGATIVE Final   Staphylococcus aureus NEGATIVE NEGATIVE Final    Comment:        The Xpert SA Assay (FDA approved for NASAL specimens in patients over 51 years of age), is one component of a comprehensive surveillance program.  Test performance has been validated by [facility] for patients greater than or equal to 21 year old. It is not intended to diagnose infection nor to guide or monitor treatment.   Culture, body fluid-bottle     Status: None (Preliminary result)   Collection Time: 10/09/15  1:39 PM  Result Value Ref Range Status   Specimen Description FLUID BILE  Final   Special Requests BOTTLES DRAWN AEROBIC AND ANAEROBIC 10MLS  Final   Culture PENDING  Incomplete   Report Status PENDING  Incomplete  Gram stain     Status: None   Collection Time: 10/09/15  1:39 PM  Result Value Ref Range Status   Specimen Description FLUID BILE  Final   Special Requests NONE  Final   Gram Stain   Final    RARE WBC PRESENT, PREDOMINANTLY PMN NO ORGANISMS SEEN    Report Status 10/09/2015 FINAL  Final     Studies: Dg Chest 2 View  10/10/2015  CLINICAL DATA:  Chest pain and weakness, history of sepsis, NSTEMI, former smoker. EXAM: CHEST  2 VIEW COMPARISON:  Portable chest x-ray  dated October 05, 2015 FINDINGS: There is interval development of moderate-sized right pleural effusion which layers inferiorly and posteriorly. Underlying atelectasis or pneumonia is suspected. The left lung is clear. The heart is top-normal in size. The pulmonary vascularity is normal. The trachea is midline. The bony thorax exhibits no acute abnormality. There is a percutaneous cholecystostomy tube present in the right upper quadrant. IMPRESSION: Probable right lower lobe pneumonia with parapneumonic effusion. There is no definite evidence of pulmonary edema. Electronically Signed   By: David  Martinique M.D.   On: 10/10/2015 08:15    Scheduled Meds: . aspirin  81 mg Oral Daily  . atorvastatin  40 mg Oral q1800  . feeding supplement  237 mL Oral TID WC  . ferrous Q000111Q C-folic acid  1 capsule Oral TID PC  . metoprolol tartrate  50 mg Oral BID  . pantoprazole  40 mg Oral Q1200  . piperacillin-tazobactam (ZOSYN)  IV  3.375 g Intravenous 4 times per day  . potassium chloride  20 mEq Oral BID    Continuous Infusions: . heparin 1,800 Units/hr (10/10/15 0317)     Time spent: 46mins  Valborg Friar MD, PhD  [facility] Pager [phone]. If 7PM-7AM, please contact night-coverage at [email or web address], password [facility] 10/10/2015, 11:50 AM  LOS: 1 day

## 2015-10-11 ENCOUNTER — Inpatient Hospital Stay (HOSPITAL_COMMUNITY): Payer: Medicare Other

## 2015-10-11 ENCOUNTER — Encounter (HOSPITAL_COMMUNITY): Payer: Self-pay | Admitting: Cardiology

## 2015-10-11 DIAGNOSIS — J948 Other specified pleural conditions: Secondary | ICD-10-CM

## 2015-10-11 DIAGNOSIS — K819 Cholecystitis, unspecified: Secondary | ICD-10-CM

## 2015-10-11 DIAGNOSIS — I5031 Acute diastolic (congestive) heart failure: Secondary | ICD-10-CM

## 2015-10-11 LAB — CBC
HCT: 25.6 % — ABNORMAL LOW (ref 39.0–52.0)
Hemoglobin: 8.8 g/dL — ABNORMAL LOW (ref 13.0–17.0)
MCH: 31.5 pg (ref 26.0–34.0)
MCHC: 34.4 g/dL (ref 30.0–36.0)
MCV: 91.8 fL (ref 78.0–100.0)
PLATELETS: 163 10*3/uL (ref 150–400)
RBC: 2.79 MIL/uL — ABNORMAL LOW (ref 4.22–5.81)
RDW: 14.3 % (ref 11.5–15.5)
WBC: 8.7 10*3/uL (ref 4.0–10.5)

## 2015-10-11 LAB — COMPREHENSIVE METABOLIC PANEL
ALT: 37 U/L (ref 17–63)
AST: 32 U/L (ref 15–41)
Albumin: 2.3 g/dL — ABNORMAL LOW (ref 3.5–5.0)
Alkaline Phosphatase: 61 U/L (ref 38–126)
Anion gap: 9 (ref 5–15)
BUN: 6 mg/dL (ref 6–20)
CHLORIDE: 106 mmol/L (ref 101–111)
CO2: 21 mmol/L — ABNORMAL LOW (ref 22–32)
Calcium: 7.7 mg/dL — ABNORMAL LOW (ref 8.9–10.3)
Creatinine, Ser: 0.9 mg/dL (ref 0.61–1.24)
Glucose, Bld: 96 mg/dL (ref 65–99)
POTASSIUM: 3.6 mmol/L (ref 3.5–5.1)
Sodium: 136 mmol/L (ref 135–145)
Total Bilirubin: 1.1 mg/dL (ref 0.3–1.2)
Total Protein: 5.7 g/dL — ABNORMAL LOW (ref 6.5–8.1)

## 2015-10-11 LAB — SPIROMETRY WITH GRAPH
FEF 25-75 Post: 1.14 L/sec
FEF 25-75 Pre: 1.08 L/sec
FEF2575-%Change-Post: 5 %
FEF2575-%Pred-Post: 46 %
FEF2575-%Pred-Pre: 43 %
FEV1-%Change-Post: 1 %
FEV1-%Pred-Post: 67 %
FEV1-%Pred-Pre: 66 %
FEV1-Post: 2.25 L
FEV1-Pre: 2.23 L
FEV1FVC-%Change-Post: 5 %
FEV1FVC-%Pred-Pre: 87 %
FEV6-%Change-Post: -3 %
FEV6-%Pred-Post: 75 %
FEV6-%Pred-Pre: 77 %
FEV6-Post: 3.27 L
FEV6-Pre: 3.38 L
FEV6FVC-%Change-Post: 0 %
FEV6FVC-%Pred-Post: 103 %
FEV6FVC-%Pred-Pre: 102 %
FVC-%Change-Post: -3 %
FVC-%Pred-Post: 73 %
FVC-%Pred-Pre: 75 %
FVC-Post: 3.37 L
FVC-Pre: 3.5 L
Post FEV1/FVC ratio: 67 %
Post FEV6/FVC ratio: 97 %
Pre FEV1/FVC ratio: 64 %
Pre FEV6/FVC Ratio: 97 %

## 2015-10-11 LAB — EXPECTORATED SPUTUM ASSESSMENT W REFEX TO RESP CULTURE

## 2015-10-11 LAB — MRSA PCR SCREENING: MRSA by PCR: NEGATIVE

## 2015-10-11 LAB — HEMOGLOBIN A1C
Hgb A1c MFr Bld: 5.7 % — ABNORMAL HIGH (ref 4.8–5.6)
Mean Plasma Glucose: 117 mg/dL

## 2015-10-11 LAB — GRAM STAIN

## 2015-10-11 LAB — EXPECTORATED SPUTUM ASSESSMENT W GRAM STAIN, RFLX TO RESP C

## 2015-10-11 LAB — AMYLASE: Amylase: 473 U/L — ABNORMAL HIGH (ref 28–100)

## 2015-10-11 LAB — LIPASE, BLOOD: Lipase: 300 U/L — ABNORMAL HIGH (ref 11–51)

## 2015-10-11 LAB — HEPARIN LEVEL (UNFRACTIONATED): Heparin Unfractionated: 0.38 IU/mL (ref 0.30–0.70)

## 2015-10-11 MED ORDER — ALBUTEROL SULFATE (2.5 MG/3ML) 0.083% IN NEBU
2.5000 mg | INHALATION_SOLUTION | Freq: Once | RESPIRATORY_TRACT | Status: AC
  Administered 2015-10-11: 2.5 mg via RESPIRATORY_TRACT

## 2015-10-11 NOTE — Progress Notes (Signed)
Procedure(s) (LRB): CORONARY ARTERY BYPASS GRAFTING (CABG) (N/A) TRANSESOPHAGEAL ECHOCARDIOGRAM (TEE) (N/A) Subjective: Patient looks better but amylase still 470, bicarb 20, HR less tachy Culture of bile no growth 500 cc R pleural effusion removed- culture pending Will order U/S of pancreas, GB  ? pseudocyst  Will need general surgery consult to assess patient before and follow patient after CABG Objective: Vital signs in last 24 hours: Temp:  [98.2 F (36.8 C)-99.4 F (37.4 C)] 98.3 F (36.8 C) (02/15 0839) Pulse Rate:  [105] 105 (02/14 1219) Cardiac Rhythm:  [-] Normal sinus rhythm (02/15 0706) Resp:  [13-26] 13 (02/15 0800) BP: (113-149)/(48-86) 113/58 mmHg (02/15 0800) SpO2:  [97 %-100 %] 100 % (02/15 0800) Weight:  [169 lb 12.1 oz (77 kg)] 169 lb 12.1 oz (77 kg) (02/14 2005)  Hemodynamic parameters for last 24 hours:  afebrile  Intake/Output from previous day: 02/14 0701 - 02/15 0700 In: 1690.4 [P.O.:1080; I.V.:510.4; IV Piggyback:100] Out: 1850 [Urine:1700; Drains:150] Intake/Output this shift: Total I/O In: 39 [I.V.:39] Out: 485 [Urine:475; Drains:10]       Exam    General- alert and comfortable   Lungs- clear without rales, wheezes   Cor- regular rate and rhythm, no murmur , gallop   Abdomen- soft, non-tender   Extremities - warm, non-tender, minimal edema   Neuro- oriented, appropriate, no focal weakness   Lab Results:  Recent Labs  10/10/15 0600 10/11/15 0300  WBC 7.5 8.7  HGB 9.7* 8.8*  HCT 27.1* 25.6*  PLT 136* 163   BMET:  Recent Labs  10/10/15 0600 10/11/15 0300  NA 136 136  K 3.3* 3.6  CL 104 106  CO2 19* 21*  GLUCOSE 101* 96  BUN 10 6  CREATININE 0.90 0.90  CALCIUM 8.1* 7.7*    PT/INR:  Recent Labs  10/10/15 0600  LABPROT 16.7*  INR 1.34   ABG    Component Value Date/Time   PHART 7.472* 10/10/2015 1210   HCO3 18.8* 10/10/2015 1210   TCO2 19.6 10/10/2015 1210   ACIDBASEDEF 4.2* 10/10/2015 1210   O2SAT 97.9  10/10/2015 1210   CBG (last 3)  No results for input(s): GLUCAP in the last 72 hours.  Assessment/Plan: S/P Procedure(s) (LRB): CORONARY ARTERY BYPASS GRAFTING (CABG) (N/A) TRANSESOPHAGEAL ECHOCARDIOGRAM (TEE) (N/A) Not ready for CABG yet Needs Gen surg consult   LOS: 2 days    Tharon Aquas Trigt III 10/11/2015

## 2015-10-11 NOTE — Consult Note (Signed)
[facility] Surgery Consult Note  Preston Bell 1940/12/06  [id number].    Requesting MD: Dr. Tyrell Antonio Chief Complaint/Reason for Consult: acute cholecystitis  HPI:  75 y/o male recently transferred to [facility] from [facility] for possible CABG after NSTEMI.  He had originally presented to [facility] on 10/05/2015 with RUQ abdominal pain, N/V which was thought to be secondary to acute cholecystitis with Klebsiella/enterobacter bacteremia.  Ultimately he was seen by Dr. Adonis Huguenin (General Surgery) and was recommended to have a perc cholecystectomy drain placed due to being a poor surgical candidate.  He was started on IV antibiotics and recommended to stay on them for 2 weeks and follow up in his office for consideration of surgery at a later date.  He has stabilized from that perspective, but during his workup he was found to have elevated troponin and also went into SVT, he never had any chest pain, he was seen by cardiology and underwent left heart catheterization on 10/09/2015 showing 90% left main lesion and 95% right RCA lesion, patient was then transferred to [facility] for possible CABG.  We were asked to evaluate the patient and given any recommendations as it relates to his gallbladder drain while he is here.  He currently has no pain or N/V.  His drain is functioning well.  He is tolerating a regular diet, urinating well, having flatus and BM's.  He thinks the attack was brought on by eating spaghetti the night before admission.  No other precipitating/alleviating factors.  No radicular pain.  He has a h/o of a colostomy and colostomy takedown.  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  He had this surgery in [place].  ROS: All systems reviewed and otherwise negative except for as above  Family History  Problem Relation Age of Onset  . Cervical cancer Mother   . Lung cancer Father   . Lung cancer Brother   . Hypertension Brother    . Hypertension Son     Past Medical History  Diagnosis Date  . Cancer ([place])   . History of colectomy   . Hypertension   . GERD (gastroesophageal reflux disease)   . HLD (hyperlipidemia)   . NSTEMI (non-ST elevated myocardial infarction) ([place])   . 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 Acute diastolic heart failure (HCC) - EDP severely elevated @ Cath post NSTEMI 10/11/2015    Past Surgical History  Procedure Laterality Date  . Colostomy reversal    . Cardiac catheterization N/A 10/09/2015    Procedure: Left Heart Cath and Coronary Angiography;  Surgeon: Wellington Hampshire, MD;  Location: [place] [facility];  Service: Cardiovascular;  Laterality: N/A;    Social History:  reports that he quit smoking about 32 years ago. He has never used smokeless tobacco. He reports that he uses illicit drugs (Marijuana). He reports that he does not drink alcohol.  Allergies: No Known Allergies  Medications Prior to Admission  Medication Sig Dispense Refill  . aspirin EC 81 MG tablet Take 81 mg by mouth daily.    Marland Kitchen atorvastatin (LIPITOR) 20 MG tablet Take 40 mg by mouth every morning.     Marland Kitchen lisinopril (PRINIVIL,ZESTRIL) 40 MG tablet Take 40 mg by mouth every morning.     . metoprolol succinate (TOPROL XL) 50 MG 24 hr tablet Take 50 mg by mouth daily.    . mirtazapine (REMERON) 15 MG tablet Take  15 mg by mouth at bedtime.    Marland Kitchen omeprazole (PRILOSEC) 20 MG capsule Take 20 mg by mouth daily.      Blood pressure 106/57, pulse 105, temperature 98.3 F (36.8 C), temperature source Oral, resp. rate 22, height 6' 2"  (1.88 m), weight 77 kg (169 lb 12.1 oz), SpO2 97 %. Physical Exam: General: pleasant, WD/WN white male who is laying in bed in NAD HEENT: head is normocephalic, atraumatic.  Sclera are noninjected.  PERRL.  Ears and nose without any masses or lesions.  Mouth is pink and moist Heart: mildly tachy, normal rhythm.  No obvious murmurs, gallops, or  rubs noted.  Extremities are warm and well perfused. Lungs: CTAB, no wheezes, rhonchi, or rales noted.  Respiratory effort nonlabored, great effort. Abd: soft, NT/ND, +BS, previous laparotomy scar noted and large abdominal wall hernia on LLQ from previous stoma site, this is soft and reducible MS: all 4 extremities are symmetrical with no cyanosis, clubbing, or edema. Skin: warm and dry with no masses, lesions, or rashes Psych: A&Ox3 with an appropriate affect.   Results for orders placed or performed during the hospital encounter of 10/09/15 (from the past 48 hour(s))  Surgical pcr screen     Status: None   Collection Time: 10/09/15  1:39 PM  Result Value Ref Range   MRSA, PCR NEGATIVE NEGATIVE   Staphylococcus aureus NEGATIVE NEGATIVE    Comment:        The Xpert SA Assay (FDA approved for NASAL specimens in patients over 26 years of age), is one component of a comprehensive surveillance program.  Test performance has been validated by [facility] for patients greater than or equal to 61 year old. It is not intended to diagnose infection nor to guide or monitor treatment.   Culture, body fluid-bottle     Status: None (Preliminary result)   Collection Time: 10/09/15  1:39 PM  Result Value Ref Range   Specimen Description FLUID BILE    Special Requests BOTTLES DRAWN AEROBIC AND ANAEROBIC 10MLS    Culture NO GROWTH 1 DAY    Report Status PENDING   Gram stain     Status: None   Collection Time: 10/09/15  1:39 PM  Result Value Ref Range   Specimen Description FLUID BILE    Special Requests NONE    Gram Stain      RARE WBC PRESENT, PREDOMINANTLY PMN NO ORGANISMS SEEN    Report Status 10/09/2015 FINAL   Urinalysis, Routine w reflex microscopic (not at [facility])     Status: None   Collection Time: 10/09/15  3:57 PM  Result Value Ref Range   Color, Urine YELLOW YELLOW   APPearance CLEAR CLEAR   Specific Gravity, Urine 1.017 1.005 - 1.030   pH 7.5 5.0 - 8.0   Glucose, UA  NEGATIVE NEGATIVE mg/dL   Hgb urine dipstick NEGATIVE NEGATIVE   Bilirubin Urine NEGATIVE NEGATIVE   Ketones, ur NEGATIVE NEGATIVE mg/dL   Protein, ur NEGATIVE NEGATIVE mg/dL   Nitrite NEGATIVE NEGATIVE   Leukocytes, UA NEGATIVE NEGATIVE    Comment: MICROSCOPIC NOT DONE ON URINES WITH NEGATIVE PROTEIN, BLOOD, LEUKOCYTES, NITRITE, OR GLUCOSE <1000 mg/dL.  Heparin level (unfractionated)     Status: Abnormal   Collection Time: 10/10/15 12:43 AM  Result Value Ref Range   Heparin Unfractionated <0.10 (L) 0.30 - 0.70 IU/mL    Comment:        IF HEPARIN RESULTS ARE BELOW EXPECTED VALUES, AND PATIENT DOSAGE HAS BEEN CONFIRMED, SUGGEST  FOLLOW UP TESTING OF ANTITHROMBIN III LEVELS. REPEATED TO VERIFY   CBC     Status: Abnormal   Collection Time: 10/10/15  6:00 AM  Result Value Ref Range   WBC 7.5 4.0 - 10.5 K/uL   RBC 2.94 (L) 4.22 - 5.81 MIL/uL   Hemoglobin 9.7 (L) 13.0 - 17.0 g/dL   HCT 27.1 (L) 39.0 - 52.0 %   MCV 92.2 78.0 - 100.0 fL   MCH 33.0 26.0 - 34.0 pg   MCHC 35.8 30.0 - 36.0 g/dL   RDW 14.3 11.5 - 15.5 %   Platelets 136 (L) 150 - 400 K/uL  Protime-INR     Status: Abnormal   Collection Time: 10/10/15  6:00 AM  Result Value Ref Range   Prothrombin Time 16.7 (H) 11.6 - 15.2 seconds   INR 1.34 0.00 - 1.49  Hemoglobin A1c     Status: Abnormal   Collection Time: 10/10/15  6:00 AM  Result Value Ref Range   Hgb A1c MFr Bld 5.7 (H) 4.8 - 5.6 %    Comment: (NOTE)         Pre-diabetes: 5.7 - 6.4         Diabetes: >6.4         Glycemic control for adults with diabetes: <7.0    Mean Plasma Glucose 117 mg/dL    Comment: (NOTE) Performed At: [facility] [place], [place] [id number] Lindon Romp MD WU:[id number]   Prealbumin     Status: Abnormal   Collection Time: 10/10/15  6:00 AM  Result Value Ref Range   Prealbumin 7.2 (L) 18 - 38 mg/dL  Amylase     Status: Abnormal   Collection Time: 10/10/15  6:00 AM  Result Value Ref Range   Amylase 442  (H) 28 - 100 U/L  Magnesium     Status: None   Collection Time: 10/10/15  6:00 AM  Result Value Ref Range   Magnesium 2.2 1.7 - 2.4 mg/dL  Comprehensive metabolic panel     Status: Abnormal   Collection Time: 10/10/15  6:00 AM  Result Value Ref Range   Sodium 136 135 - 145 mmol/L   Potassium 3.3 (L) 3.5 - 5.1 mmol/L   Chloride 104 101 - 111 mmol/L   CO2 19 (L) 22 - 32 mmol/L   Glucose, Bld 101 (H) 65 - 99 mg/dL   BUN 10 6 - 20 mg/dL   Creatinine, Ser 0.90 0.61 - 1.24 mg/dL   Calcium 8.1 (L) 8.9 - 10.3 mg/dL   Total Protein 5.8 (L) 6.5 - 8.1 g/dL   Albumin 2.2 (L) 3.5 - 5.0 g/dL   AST 34 15 - 41 U/L   ALT 41 17 - 63 U/L   Alkaline Phosphatase 59 38 - 126 U/L   Total Bilirubin 1.3 (H) 0.3 - 1.2 mg/dL   GFR calc non Af Amer >60 >60 mL/min   GFR calc Af Amer >60 >60 mL/min    Comment: (NOTE) The eGFR has been calculated using the CKD EPI equation. This calculation has not been validated in all clinical situations. eGFR's persistently <60 mL/min signify possible Chronic Kidney Disease.    Anion gap 13 5 - 15  Heparin level (unfractionated)     Status: Abnormal   Collection Time: 10/10/15 10:56 AM  Result Value Ref Range   Heparin Unfractionated 0.26 (L) 0.30 - 0.70 IU/mL    Comment:        IF HEPARIN RESULTS ARE BELOW EXPECTED  VALUES, AND PATIENT DOSAGE HAS BEEN CONFIRMED, SUGGEST FOLLOW UP TESTING OF ANTITHROMBIN III LEVELS.   Blood gas, arterial     Status: Abnormal   Collection Time: 10/10/15 12:10 PM  Result Value Ref Range   FIO2 0.24    Delivery systems NASAL CANNULA    pH, Arterial 7.472 (H) 7.350 - 7.450   pCO2 arterial 26.1 (L) 35.0 - 45.0 mmHg   pO2, Arterial 104 (H) 80.0 - 100.0 mmHg   Bicarbonate 18.8 (L) 20.0 - 24.0 mEq/L   TCO2 19.6 0 - 100 mmol/L   Acid-base deficit 4.2 (H) 0.0 - 2.0 mmol/L   O2 Saturation 97.9 %   Patient temperature 98.6    Collection site LEFT RADIAL    Drawn by [id number]    Sample type ARTERIAL    Allens test (pass/fail) PASS PASS   Gram stain     Status: None   Collection Time: 10/10/15  3:50 PM  Result Value Ref Range   Specimen Description FLUID PLEURAL RIGHT    Special Requests NONE    Gram Stain      CYTOSPIN SMEAR WBC PRESENT,BOTH PMN AND MONONUCLEAR NO ORGANISMS SEEN    Report Status 10/11/2015 FINAL   Heparin level (unfractionated)     Status: None   Collection Time: 10/10/15 10:31 PM  Result Value Ref Range   Heparin Unfractionated 0.45 0.30 - 0.70 IU/mL    Comment:        IF HEPARIN RESULTS ARE BELOW EXPECTED VALUES, AND PATIENT DOSAGE HAS BEEN CONFIRMED, SUGGEST FOLLOW UP TESTING OF ANTITHROMBIN III LEVELS.   MRSA PCR Screening     Status: None   Collection Time: 10/11/15 12:53 AM  Result Value Ref Range   MRSA by PCR NEGATIVE NEGATIVE    Comment:        The GeneXpert MRSA Assay (FDA approved for NASAL specimens only), is one component of a comprehensive MRSA colonization surveillance program. It is not intended to diagnose MRSA infection nor to guide or monitor treatment for MRSA infections.   Culture, expectorated sputum-assessment     Status: None   Collection Time: 10/11/15 12:53 AM  Result Value Ref Range   Specimen Description EXPECTORATED SPUTUM    Special Requests NONE    Sputum evaluation      MICROSCOPIC FINDINGS SUGGEST THAT THIS SPECIMEN IS NOT REPRESENTATIVE OF LOWER RESPIRATORY SECRETIONS. PLEASE RECOLLECT. Gram Stain Report Called to,Read Back By and Verified With: R SHUMBER,RN @0629  58/85/02 MKELLY    Report Status 10/11/2015 FINAL   Heparin level (unfractionated)     Status: None   Collection Time: 10/11/15  3:00 AM  Result Value Ref Range   Heparin Unfractionated 0.38 0.30 - 0.70 IU/mL    Comment:        IF HEPARIN RESULTS ARE BELOW EXPECTED VALUES, AND PATIENT DOSAGE HAS BEEN CONFIRMED, SUGGEST FOLLOW UP TESTING OF ANTITHROMBIN III LEVELS.   CBC     Status: Abnormal   Collection Time: 10/11/15  3:00 AM  Result Value Ref Range   WBC 8.7 4.0 - 10.5 K/uL    RBC 2.79 (L) 4.22 - 5.81 MIL/uL   Hemoglobin 8.8 (L) 13.0 - 17.0 g/dL   HCT 25.6 (L) 39.0 - 52.0 %   MCV 91.8 78.0 - 100.0 fL   MCH 31.5 26.0 - 34.0 pg   MCHC 34.4 30.0 - 36.0 g/dL   RDW 14.3 11.5 - 15.5 %   Platelets 163 150 - 400 K/uL  Comprehensive metabolic panel  Status: Abnormal   Collection Time: 10/11/15  3:00 AM  Result Value Ref Range   Sodium 136 135 - 145 mmol/L   Potassium 3.6 3.5 - 5.1 mmol/L   Chloride 106 101 - 111 mmol/L   CO2 21 (L) 22 - 32 mmol/L   Glucose, Bld 96 65 - 99 mg/dL   BUN 6 6 - 20 mg/dL   Creatinine, Ser 0.90 0.61 - 1.24 mg/dL   Calcium 7.7 (L) 8.9 - 10.3 mg/dL   Total Protein 5.7 (L) 6.5 - 8.1 g/dL   Albumin 2.3 (L) 3.5 - 5.0 g/dL   AST 32 15 - 41 U/L   ALT 37 17 - 63 U/L   Alkaline Phosphatase 61 38 - 126 U/L   Total Bilirubin 1.1 0.3 - 1.2 mg/dL   GFR calc non Af Amer >60 >60 mL/min   GFR calc Af Amer >60 >60 mL/min    Comment: (NOTE) The eGFR has been calculated using the CKD EPI equation. This calculation has not been validated in all clinical situations. eGFR's persistently <60 mL/min signify possible Chronic Kidney Disease.    Anion gap 9 5 - 15  Amylase     Status: Abnormal   Collection Time: 10/11/15  3:00 AM  Result Value Ref Range   Amylase 473 (H) 28 - 100 U/L   Dg Chest 1 View  10/10/2015  CLINICAL DATA:  Status post right-sided thoracentesis EXAM: CHEST 1 VIEW COMPARISON:  10/10/2015 FINDINGS: There is decrease in right-sided pleural effusion when compared with the prior exam. No pneumothorax is seen. The left lung remains clear. Drainage catheter is noted over the right upper abdomen IMPRESSION: No evidence of pneumothorax following right-sided thoracentesis. Reduction in right-sided effusion is noted. Electronically Signed   By: Inez Catalina M.D.   On: 10/10/2015 16:12   Dg Chest 2 View  10/10/2015  CLINICAL DATA:  Chest pain and weakness, history of sepsis, NSTEMI, former smoker. EXAM: CHEST  2 VIEW COMPARISON:  Portable  chest x-ray dated October 05, 2015 FINDINGS: There is interval development of moderate-sized right pleural effusion which layers inferiorly and posteriorly. Underlying atelectasis or pneumonia is suspected. The left lung is clear. The heart is top-normal in size. The pulmonary vascularity is normal. The trachea is midline. The bony thorax exhibits no acute abnormality. There is a percutaneous cholecystostomy tube present in the right upper quadrant. IMPRESSION: Probable right lower lobe pneumonia with parapneumonic effusion. There is no definite evidence of pulmonary edema. Electronically Signed   By: David  Martinique M.D.   On: 10/10/2015 08:15   Ct Chest Wo Contrast  10/10/2015  CLINICAL DATA:  Right upper quadrant pain. Status post placement of cholecystostomy tube 10/05/2015. Mildly productive cough. Shortness of breath. Sepsis. Subsequent encounter. EXAM: CT CHEST WITHOUT CONTRAST TECHNIQUE: Multidetector CT imaging of the chest was performed following the standard protocol without IV contrast. COMPARISON:  PA and lateral chest 10/10/2015 and 10/05/2015. CT abdomen and pelvis 10/05/2015. FINDINGS: The patient has small to moderate right pleural effusion. No left pleural effusion or pericardial effusion is identified. There is no axillary, hilar or mediastinal lymphadenopathy. Calcific aortic and coronary atherosclerosis is identified. Small area of calcified pleural plaque with pleural thickening is again seen posteriorly on the right. Lungs demonstrate centrilobular emphysematous disease. Airspace disease is seen in the posterior right lower lobe with some air bronchograms present. Minimal dependent atelectasis on the left is noted. The lungs are otherwise unremarkable. Visualized upper abdomen demonstrates a cholecystostomy tube in place. There is  fluid in the right upper quadrant of the abdomen extending over the dome of the liver which is new since the prior CT scan. Stranding is present in the gallbladder  fossa but no fluid collection within the gallbladder fossa is identified. The gallbladder is completely decompressed. IMPRESSION: Small to moderate right pleural effusion with right lower lobe airspace disease with air bronchograms present worrisome for pneumonia. New perihepatic fluid extends over the dome of the liver and could be bile or ascitic fluid, septic or aseptic. Emphysema. Electronically Signed   By: Inge Rise M.D.   On: 10/10/2015 14:21   US Thoracentesis Asp Pleural Space W/img Guide  10/10/2015  Ascencion Dike, PA-C     10/10/2015  3:55 PM Successful US guided right thoracentesis. Yielded 557m of hazy, amber colored fluid. Pt tolerated procedure well. No immediate complications. Specimen was sent for culture as requested. CXR ordered. BAscencion DikePA-C 10/10/2015 3:55 PM      Assessment/Plan Acute calculus cholecystitis s/p perc chole drain 10/05/2015 (Ordered by Dr. WAdonis Huguenin- [place])  NSTEMI s/p Cath pending CABG Friday with Dr. VPrescott GumAbdominal wall hernia at previous stoma site  Plan: 1.  Abdominal pain is resolved, no N/V.  Bili, Alk phos, ALT, AST, WBC all normal on 10/10/15.  Would re-check lipase since it was 366 on the 9th, but that is likely normalized as well.  GB is well drained by the perch chole tube.  Originally drained dark bilious output and is now much clearer ([id number]).  He is tolerating a regular diet.   2.  There is nothing that needs to be done from a general surgery standpoint for his gallbladder while he is here since the tube has taken care of his acute condition.  He should continue on antibiotics as recommended by Dr. WoAdonis Hugueninor a total of 2 weeks.  He will need to be taught how to empty his drain, clean around it, and record output daily. 3.  He will follow up with Dr. WoAdonis Hugueninn 3-4 weeks after discharge.  He can call his office to arrange this.  I doubt that he will be eligible for cholecystectomy any time soon especially with need of  anticoagulation after CABG and recent NSTEMI.  He would need to be cleared from cardiology/CVTS.  [facility]. 4.  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  [place]   MeNat ChristenPACandler Hospitalurgery 10/11/2015, 11:07 AM Pager: [phone]

## 2015-10-11 NOTE — Progress Notes (Addendum)
Patient: Preston Bell / Admit Date: 10/09/2015 / Date of Encounter: 10/11/2015, 10:08 AM  Transferred to [facility] from [facility] post cath demonstrating LM CAD with severe RCA disease. He was admitted with Acute Cholecystitis/pancreatitis - Perc Tube.  Severe sepsis - Klebsiella bacteremia. Cath for + Troponin. Now CXR suggestive of RLL PNA vs. effusion   Subjective: - s/p thoracentesis yesterday (550mL out); feels better today - Remains tachycardic (likely compensatory  Review of Systems: Review of Systems  Constitutional: Positive for weight loss.  Respiratory: Positive for cough and shortness of breath.        Notably less sob  Cardiovascular: Negative.   Gastrointestinal: Positive for nausea and abdominal pain. Negative for blood in stool and melena.  Genitourinary: Negative for hematuria.  Musculoskeletal: Negative.   Neurological: Negative for dizziness and headaches.  Psychiatric/Behavioral: The patient is nervous/anxious.   All other systems reviewed and are negative.   Objective: Telemetry: NSR, no significant arrhythmia Physical Exam: Blood pressure 113/58, pulse 105, temperature 98.3 F (36.8 C), temperature source Oral, resp. rate 13, height 6\' 2"  (1.88 m), weight 77 kg (169 lb 12.1 oz), SpO2 100 %. Body mass index is 21.79 kg/(m^2). General: Overall ill-appearing - thin and frail, nontoxic, but mostly anxious. Head: Normocephalic, atraumatic, sclera non-icteric, no xanthomas, nares are without discharge. Neck: Negative for carotid bruits. JVP not elevated. Lungs: Somewhat tachypneic with decreased breath sounds in the right base (improved). otherwise clear. Nonlabored besides tachypnea. Heart: Tachycardic with regular rhythm, S1 S2 without murmurs, rubs, or gallops.  Abdomen: Soft, non-tender, non-distended with normoactive bowel sounds.  Extremities: No clubbing or cyanosis. No edema. Distal pedal pulses are 2+ and equal bilaterally. Neuro: Alert and oriented X 3. Moves  all extremities spontaneously. Psych:  Responds to questions appropriately with a normal affect.   Intake/Output Summary (Last 24 hours) at 10/11/15 1008 Last data filed at 10/11/15 0900  Gross per 24 hour  Intake 1369.38 ml  Output   1685 ml  Net -315.62 ml    Inpatient Medications:  . aspirin  81 mg Oral Daily  . atorvastatin  40 mg Oral q1800  . feeding supplement  237 mL Oral TID WC  . ferrous Q000111Q C-folic acid  1 capsule Oral TID PC  . metoprolol tartrate  50 mg Oral BID  . mirtazapine  15 mg Oral QHS  . pantoprazole  40 mg Oral Q1200  . piperacillin-tazobactam (ZOSYN)  IV  3.375 g Intravenous 3 times per day  . potassium chloride  20 mEq Oral BID   Infusions:  . heparin 1,950 Units/hr (10/11/15 0618)    Labs:  Recent Labs  10/10/15 0600 10/11/15 0300  NA 136 136  K 3.3* 3.6  CL 104 106  CO2 19* 21*  GLUCOSE 101* 96  BUN 10 6  CREATININE 0.90 0.90  CALCIUM 8.1* 7.7*  MG 2.2  --     Recent Labs  10/10/15 0600 10/11/15 0300  AST 34 32  ALT 41 37  ALKPHOS 59 61  BILITOT 1.3* 1.1  PROT 5.8* 5.7*  ALBUMIN 2.2* 2.3*    Recent Labs  10/10/15 0600 10/11/15 0300  WBC 7.5 8.7  HGB 9.7* 8.8*  HCT 27.1* 25.6*  MCV 92.2 91.8  PLT 136* 163   No results for input(s): CKTOTAL, CKMB, TROPONINI in the last 72 hours. Invalid input(s): POCBNP  Recent Labs  10/10/15 0600  HGBA1C 5.7*     Weights: Filed Weights   10/09/15 1200 10/10/15 2005  Weight: 78 kg (171 lb 15.3 oz) 77 kg (169 lb 12.1 oz)    Radiology/Studies:  Chest x-ray today shows probable right lower lobe pneumonia with parapneumonic effusion and no definitive pulmonary edema.  Cardiac Cath 10/09/2015:    Conclusion     LM lesion, 90% stenosed.  Ost LAD lesion, 70% stenosed.; Ost 2nd Diag to 2nd Diag lesion, 90% stenosed.  Ost Cx to Prox Cx lesion, 80% stenosed.  Mid RCA lesion, 95% stenosed.  The left ventricular systolic function is normal.  1. Severe  distal left main and mid RCA stenosis. 2. Normal LV systolic function. 3. Severely elevated left ventricular end-diastolic pressure.  Recommend CABG at [facility].     Echo 10/06/15: EF 55-60%, no RWMA, Gr 1 DD, Trivial AI, mild LA dilation  Thoracentesis 2/14 -- unfortunately fluid studies not sent beyond culture & gram stain.   Assessment and Plan  75 y.o. male  Principal Problem:   Cholecystitis Active Problems:   Sepsis ([place])   NSTEMI (non-ST elevated myocardial infarction) ([place])   Left main coronary artery disease: 90% LM   Right lower lobe pneumonia   Bacteremia due to Klebsiella pneumoniae   CAD, multiple vessel; 90% LM, 90% ostLAD, 90% OstD2, 80% ostCx, 95% mRCA   Acute diastolic heart failure (H[facility]) - EDP severely elevated @ Cath post NSTEMI   Pleural effusion on right - s/p pericardiocentesis   Vomiting and diarrhea   Elevated Troponin/ non-STEMI with severe CAD -   troponin 19.47 at peak consistent with NSTEMI - severe multivessel disease with left main, LAD, circumflex and RCA disease  Restarted IV heparin infusion post cath - Hgb dropping (but probably related to procedures & blood draws.    If Hgb drops below 8, would transfuse.  On statin and beta blocker and aspirin  BP is borderline, will hold off on increasing BB dose .  Diastolic HF - no PND or edema.    Avoid XS volume.    No indication for diuresis @ present with ongoing insensible losses from sepsis  HTN - maintaining normal to mildly elevated pressures despite sepsis - continue current treatment -- at present, we have room for additional beta blocker if needed Ranging from [id number] up to [id number] systolic on average  HLD- continue statin therapy, on Lipitor 40 mg daily  Acute Cholecystitis/ pancreatitis  - CT of the abdomen showed acute cholecystitis.   current high-surgical risk. - Even higher now with known severe CAD   IR was consulted and a percutaneous cholecystostomy tube   Amylase level 473 (not  dropping)  - He remains tachycardic -- related to sepsis but otherwise also related to anxiety. - Need to treat the underlying cause and not mask sepsis related tachycardia, however with his severe coronary disease I would like to try to keep his heart rate close to 100 than 120. - As blood pressure tolerates, we may titrate up beta blocker.  Sepsis - combination of cholecystitis, pancreatitis and now pneumonia vs. Pleural effusion.  On antibiotics per medicine service  Blood Cx on 2/11 NGTD  With the significant tachycardia and his somewhat unsteady nature, I agree with transfer to stepdown unit. Would prefer 2H  -  * Discussed with TRH - I agree that with perihepatic fluid & active Chole/Panc inflammation, would be prudent to involve Gen Sgx.  Dr. Prescott Gum from CT surgery has seen the patient. He definitely requires multivessel CABG, but will need to be much more stable from a sepsis, cholecystitis/pancreatic otitis  and pneumonia standpoint. Amylase will need to be significantly reduced -- hoping for CABG on Friday.  Cardiology will follow along. Besides sepsis related tachycardia, he is relatively asymmetric cardiac standpoint for now, however he would not tolerate prolonged tachycardia or hypotension.  Judicious use of beta blocker to avoid hypotension.   Signed, Leonie Man, M.D., M.S. Interventional Cardiologist   Pager # [phone] Phone # [phone] [address] [place], [place] [address]

## 2015-10-11 NOTE — Progress Notes (Signed)
PROGRESS NOTE  Preston Bell [id number] DOB: [birth year] DOA: 10/09/2015 PCP: Pcp Not In System  HPI/Recap of past 24 hours: Feeling ok, denies worsening abdominal pain.     Assessment/Plan: Principal Problem:   Cholecystitis Active Problems:   Sepsis ([place])   NSTEMI (non-ST elevated myocardial infarction) (HCC)   Vomiting and diarrhea   Left main coronary artery disease: 90% LM   Right lower lobe pneumonia   Bacteremia due to Klebsiella pneumoniae   CAD, multiple vessel; 90% LM, 90% ostLAD, 90% OstD2, 80% ostCx, 95% mRCA   Pleural effusion on right - s/p pericardiocentesis   Acute diastolic heart failure ([place]) - EDP severely elevated @ Cath post NSTEMI  1. Non-STEMI. Incidentally found during hospital workup for cholecystitis. Cardiac catheterization showing 90% lesion of left main and 95% in mid RCA . Patient is completely symptom free, continue heparin drip, continue beta blocker, add low-dose aspirin and statin for secondary prevention, cardiology and cardiothoracic surgery following, as well. Per Dr. Lucianne Lei tright patient to undergo CABG in the next 5-6 days once stable from bacteremia standpoint.  2. Acute cholecystitis complicated with sepsis , Klebsiella bacteremia.  Sensitivities pending. Currently on IV Zosyn, cholecystectomy drain placed at [place], , reported poor oral intake but symptom largely controlled with  prn analgesics and antiemetics. Outpatient follow-up with surgery post CABG. Lipase 300, amylase 473. CT 2-14 showed fluid around liver dome could be bile or ascites. Consult General Sx.  Will consult General Sx.  Continue with Zosyn.   3. pna with pleural effusion: s/p US guided thoracentesis on 2/14, culture pending, continue abx. MRSA PCR negative.   4. SVT/sinus tachycardia. Initially responded well to Adenosine/Cardizem at [facility], currently on beta blocker, IV Lopressor order did well, heart rate high upon admission, tachycardia from sepsis.  Cardiology following.   5. GERD. On PPI.  6. Dyslipidemia. On  Statin. Monitor LFT   7. Essential hypertension. Currently on beta blocker will monitor.  8. Hypokalemia. Replace, keep k>4, mag >2.  9. History of colon cancer, abdominal wall hernia, remote history of smoking and alcohol abuse. Outpatient follow-up with PCP and general surgery as needed.  10-Anemia: monitor hb , trending down. Patient on heparin.   DVT prophylaxis: on  Heparin drip  Code Status: full  Family Communication: patient and family (wife and daughter in room)  Disposition Plan: remain  Stepdown   Consultants:  Cardiology  CT surgery  Procedures: cholecystectomy drain placed at [place] US guided thoracentesis on 2/14 at [place]  Antibiotics:  Zosyn started at on admission (he was on rocephin at )   Objective: BP 113/58 mmHg  Pulse 105  Temp(Src) 98.3 F (36.8 C) (Oral)  Resp 13  Ht 6\' 2"  (1.88 m)  Wt 77 kg (169 lb 12.1 oz)  BMI 21.79 kg/m2  SpO2 100%  Intake/Output Summary (Last 24 hours) at 10/11/15 0951 Last data filed at 10/11/15 0900  Gross per 24 hour  Intake 1369.38 ml  Output   1685 ml  Net -315.62 ml   Filed Weights   10/09/15 1200 10/10/15 2005  Weight: 78 kg (171 lb 15.3 oz) 77 kg (169 lb 12.1 oz)    Exam:   General: NAD  Cardiovascular: sinus tachcardia  Respiratory: diminished right lower lobe, no wheezing, no rhonchi  Abdomen: RUQ GB drain in place, large LLQ abdominal wall hernia.  positive BS, no significant tenderness  Musculoskeletal: No Edema  Neuro: aaox3  Data Reviewed: Basic Metabolic Panel:  Recent Labs Lab 10/05/15  2141 10/06/15 0303 10/09/15 0005 10/10/15 0600 10/11/15 0300  NA 136 136 137 136 136  K 4.1 3.8 2.9* 3.3* 3.6  CL 105 103 108 104 106  CO2 24 27 22  19* 21*  GLUCOSE 151* 125* 89 101* 96  BUN 19 18 14 10 6   CREATININE 1.07 0.99 0.86 0.90 0.90  CALCIUM 7.9* 8.1* 7.8* 8.1* 7.7*  MG 1.1* 2.4  --  2.2  --     PHOS  --  2.7  --   --   --    Liver Function Tests:  Recent Labs Lab 10/05/15 0839 10/06/15 0303 10/10/15 0600 10/11/15 0300  AST 190* 174* 34 32  ALT 173* 103* 41 37  ALKPHOS 125 58 59 61  BILITOT 1.9* 2.2* 1.3* 1.1  PROT 8.4* 5.9* 5.8* 5.7*  ALBUMIN 4.8 3.2* 2.2* 2.3*    Recent Labs Lab 10/05/15 0839 10/10/15 0600 10/11/15 0300  LIPASE 366*  --   --   AMYLASE  --  442* 473*   No results for input(s): AMMONIA in the last 168 hours. CBC:  Recent Labs Lab 10/05/15 0839  10/07/15 0229 10/08/15 0635 10/09/15 0009 10/10/15 0600 10/11/15 0300  WBC 23.1*  < > 9.1 7.6 6.9 7.5 8.7  NEUTROABS 21.6*  --   --   --   --   --   --   HGB 13.2  < > 10.5* 9.4* 9.1* 9.7* 8.8*  HCT 39.3*  < > 30.2* 27.1* 26.0* 27.1* 25.6*  MCV 96.4  < > 93.6 94.4 93.1 92.2 91.8  PLT 238  < > 98* 103* 111* 136* 163  < > = values in this interval not displayed. Cardiac Enzymes:    Recent Labs Lab 10/05/15 2141 10/06/15 0303 10/06/15 1119 10/06/15 1731 10/06/15 2331  TROPONINI 13.17* 19.47* 13.95* 11.27* 7.56*   BNP (last 3 results) No results for input(s): BNP in the last 8760 hours.  ProBNP (last 3 results) No results for input(s): PROBNP in the last 8760 hours.  CBG:  Recent Labs Lab 10/05/15 1731  GLUCAP 124*    Recent Results (from the past 240 hour(s))  Blood culture (routine x 2)     Status: None   Collection Time: 10/05/15  9:06 AM  Result Value Ref Range Status   Specimen Description BLOOD RIGHT IV  Final   Special Requests   Final    BOTTLES DRAWN AEROBIC AND ANAEROBIC AER 4ML ANA 1ML   Culture  Setup Time   Final    GRAM NEGATIVE RODS IN BOTH AEROBIC AND ANAEROBIC BOTTLES CRITICAL RESULT CALLED TO, READ BACK BY AND VERIFIED WITH: MATT MCBANE [id number] 10/05/15 MLZ     Culture   Final    KLEBSIELLA PNEUMONIAE IN BOTH AEROBIC AND ANAEROBIC BOTTLES    Report Status 10/07/2015 FINAL  Final   Organism ID, Bacteria KLEBSIELLA PNEUMONIAE  Final      Susceptibility    Klebsiella pneumoniae - MIC*    AMPICILLIN Value in next row Resistant      RESISTANT>=32    CEFAZOLIN Value in next row Sensitive      SENSITIVE<=4    CEFEPIME Value in next row Sensitive      SENSITIVE<=1    CEFTAZIDIME Value in next row Sensitive      SENSITIVE<=1    CEFTRIAXONE Value in next row Sensitive      SENSITIVE<=1    CIPROFLOXACIN Value in next row Sensitive      SENSITIVE<=0.25  GENTAMICIN Value in next row Sensitive      SENSITIVE<=1    IMIPENEM Value in next row Sensitive      SENSITIVE<=0.25    TRIMETH/SULFA Value in next row Sensitive      SENSITIVE<=20    AMPICILLIN/SULBACTAM Value in next row Sensitive      SENSITIVE8    PIP/TAZO Value in next row Sensitive      SENSITIVE<=4    * KLEBSIELLA PNEUMONIAE  Blood Culture ID Panel (Reflexed)     Status: Abnormal   Collection Time: 10/05/15  9:06 AM  Result Value Ref Range Status   Enterococcus species NOT DETECTED NOT DETECTED Final   Listeria monocytogenes NOT DETECTED NOT DETECTED Final   Staphylococcus species NOT DETECTED NOT DETECTED Final   Staphylococcus aureus NOT DETECTED NOT DETECTED Final   Streptococcus species NOT DETECTED NOT DETECTED Final   Streptococcus agalactiae NOT DETECTED NOT DETECTED Final   Streptococcus pneumoniae NOT DETECTED NOT DETECTED Final   Streptococcus pyogenes NOT DETECTED NOT DETECTED Final   Acinetobacter baumannii NOT DETECTED NOT DETECTED Final   Enterobacteriaceae species DETECTED (A) NOT DETECTED Final    Comment: CALLED TO MATT MCBANE AT 2255 10/05/15 MLZ    Enterobacter cloacae complex NOT DETECTED NOT DETECTED Final   Escherichia coli NOT DETECTED NOT DETECTED Final   Klebsiella oxytoca NOT DETECTED NOT DETECTED Final   Klebsiella pneumoniae DETECTED (A) NOT DETECTED Final    Comment: CALLED TO MATT MCBANE AT 2255 10/05/15 MLZ    Proteus species NOT DETECTED NOT DETECTED Final   Serratia marcescens NOT DETECTED NOT DETECTED Final   Haemophilus influenzae NOT DETECTED  NOT DETECTED Final   Neisseria meningitidis NOT DETECTED NOT DETECTED Final   Pseudomonas aeruginosa NOT DETECTED NOT DETECTED Final   Candida albicans NOT DETECTED NOT DETECTED Final   Candida glabrata NOT DETECTED NOT DETECTED Final   Candida krusei NOT DETECTED NOT DETECTED Final   Candida parapsilosis NOT DETECTED NOT DETECTED Final   Candida tropicalis NOT DETECTED NOT DETECTED Final   Carbapenem resistance NOT DETECTED NOT DETECTED Final   Methicillin resistance NOT DETECTED NOT DETECTED Final   Vancomycin resistance NOT DETECTED NOT DETECTED Final  CULTURE, BLOOD (ROUTINE X 2) w Reflex to PCR ID Panel     Status: None (Preliminary result)   Collection Time: 10/07/15 11:34 AM  Result Value Ref Range Status   Specimen Description BLOOD RIGHT ANTECUBITAL  Final   Special Requests   Final    BOTTLES DRAWN AEROBIC AND ANAEROBIC  AER 4CC ANA 1CC   Culture NO GROWTH 4 DAYS  Final   Report Status PENDING  Incomplete  CULTURE, BLOOD (ROUTINE X 2) w Reflex to PCR ID Panel     Status: None (Preliminary result)   Collection Time: 10/07/15 11:34 AM  Result Value Ref Range Status   Specimen Description BLOOD RIGHT HAND  Final   Special Requests BOTTLES DRAWN AEROBIC AND ANAEROBIC  1CC  Final   Culture NO GROWTH 4 DAYS  Final   Report Status PENDING  Incomplete  Surgical pcr screen     Status: None   Collection Time: 10/09/15  1:39 PM  Result Value Ref Range Status   MRSA, PCR NEGATIVE NEGATIVE Final   Staphylococcus aureus NEGATIVE NEGATIVE Final    Comment:        The Xpert SA Assay (FDA approved for NASAL specimens in patients over 44 years of age), is one component of a comprehensive surveillance program.  Test performance  has been validated by [facility] for patients greater than or equal to 46 year old. It is not intended to diagnose infection nor to guide or monitor treatment.   Culture, body fluid-bottle     Status: None (Preliminary result)   Collection Time: 10/09/15   1:39 PM  Result Value Ref Range Status   Specimen Description FLUID BILE  Final   Special Requests BOTTLES DRAWN AEROBIC AND ANAEROBIC 10MLS  Final   Culture NO GROWTH 1 DAY  Final   Report Status PENDING  Incomplete  Gram stain     Status: None   Collection Time: 10/09/15  1:39 PM  Result Value Ref Range Status   Specimen Description FLUID BILE  Final   Special Requests NONE  Final   Gram Stain   Final    RARE WBC PRESENT, PREDOMINANTLY PMN NO ORGANISMS SEEN    Report Status 10/09/2015 FINAL  Final  Gram stain     Status: None   Collection Time: 10/10/15  3:50 PM  Result Value Ref Range Status   Specimen Description FLUID PLEURAL RIGHT  Final   Special Requests NONE  Final   Gram Stain   Final    CYTOSPIN SMEAR WBC PRESENT,BOTH PMN AND MONONUCLEAR NO ORGANISMS SEEN    Report Status 10/11/2015 FINAL  Final  MRSA PCR Screening     Status: None   Collection Time: 10/11/15 12:53 AM  Result Value Ref Range Status   MRSA by PCR NEGATIVE NEGATIVE Final    Comment:        The GeneXpert MRSA Assay (FDA approved for NASAL specimens only), is one component of a comprehensive MRSA colonization surveillance program. It is not intended to diagnose MRSA infection nor to guide or monitor treatment for MRSA infections.   Culture, expectorated sputum-assessment     Status: None   Collection Time: 10/11/15 12:53 AM  Result Value Ref Range Status   Specimen Description EXPECTORATED SPUTUM  Final   Special Requests NONE  Final   Sputum evaluation   Final    MICROSCOPIC FINDINGS SUGGEST THAT THIS SPECIMEN IS NOT REPRESENTATIVE OF LOWER RESPIRATORY SECRETIONS. PLEASE RECOLLECT. Gram Stain Report Called to,Read Back By and Verified With: R SHUMBER,RN @0629  10/11/15 MKELLY    Report Status 10/11/2015 FINAL  Final     Studies: Dg Chest 1 View  10/10/2015  CLINICAL DATA:  Status post right-sided thoracentesis EXAM: CHEST 1 VIEW COMPARISON:  10/10/2015 FINDINGS: There is decrease in  right-sided pleural effusion when compared with the prior exam. No pneumothorax is seen. The left lung remains clear. Drainage catheter is noted over the right upper abdomen IMPRESSION: No evidence of pneumothorax following right-sided thoracentesis. Reduction in right-sided effusion is noted. Electronically Signed   By: Inez Catalina M.D.   On: 10/10/2015 16:12   Ct Chest Wo Contrast  10/10/2015  CLINICAL DATA:  Right upper quadrant pain. Status post placement of cholecystostomy tube 10/05/2015. Mildly productive cough. Shortness of breath. Sepsis. Subsequent encounter. EXAM: CT CHEST WITHOUT CONTRAST TECHNIQUE: Multidetector CT imaging of the chest was performed following the standard protocol without IV contrast. COMPARISON:  PA and lateral chest 10/10/2015 and 10/05/2015. CT abdomen and pelvis 10/05/2015. FINDINGS: The patient has small to moderate right pleural effusion. No left pleural effusion or pericardial effusion is identified. There is no axillary, hilar or mediastinal lymphadenopathy. Calcific aortic and coronary atherosclerosis is identified. Small area of calcified pleural plaque with pleural thickening is again seen posteriorly on the right. Lungs demonstrate centrilobular  emphysematous disease. Airspace disease is seen in the posterior right lower lobe with some air bronchograms present. Minimal dependent atelectasis on the left is noted. The lungs are otherwise unremarkable. Visualized upper abdomen demonstrates a cholecystostomy tube in place. There is fluid in the right upper quadrant of the abdomen extending over the dome of the liver which is new since the prior CT scan. Stranding is present in the gallbladder fossa but no fluid collection within the gallbladder fossa is identified. The gallbladder is completely decompressed. IMPRESSION: Small to moderate right pleural effusion with right lower lobe airspace disease with air bronchograms present worrisome for pneumonia. New perihepatic fluid  extends over the dome of the liver and could be bile or ascitic fluid, septic or aseptic. Emphysema. Electronically Signed   By: Inge Rise M.D.   On: 10/10/2015 14:21   US Thoracentesis Asp Pleural Space W/img Guide  10/10/2015  Ascencion Dike, PA-C     10/10/2015  3:55 PM Successful US guided right thoracentesis. Yielded 552mL of hazy, amber colored fluid. Pt tolerated procedure well. No immediate complications. Specimen was sent for culture as requested. CXR ordered. Ascencion Dike PA-C 10/10/2015 3:55 PM    Scheduled Meds: . aspirin  81 mg Oral Daily  . atorvastatin  40 mg Oral q1800  . feeding supplement  237 mL Oral TID WC  . ferrous Q000111Q C-folic acid  1 capsule Oral TID PC  . metoprolol tartrate  50 mg Oral BID  . mirtazapine  15 mg Oral QHS  . pantoprazole  40 mg Oral Q1200  . piperacillin-tazobactam (ZOSYN)  IV  3.375 g Intravenous 3 times per day  . potassium chloride  20 mEq Oral BID    Continuous Infusions: . heparin 1,950 Units/hr (10/11/15 0618)     Time spent: 53mins  Niel Hummer A MD.  [facility] Pager [phone]. If 7PM-7AM, please contact night-coverage at [email or web address], password [facility] 10/11/2015, 9:51 AM  LOS: 2 days

## 2015-10-11 NOTE — Significant Event (Signed)
Received a call from ultrasound to keep patient NPO for abdomen U/S that will take place this afternoon at 3pm.

## 2015-10-11 NOTE — Clinical Documentation Improvement (Signed)
Cardiology Cardiothoracic  Patient is on nutritional supplements ([employer]) 3x's daily. Weight loss is documented. BMI is 21. Patient is 6\' 2"  tall weighing 169 pounds. Based on these clinical indicators, please document any associated diagnoses/conditions the patient has or may have. Document findings in next progress note NOT in BPA drop down box if applicable.   Underweight  Malnutrition - please note degree if applicable - mild, moderate, severe. Fat, muscle wasting?  Weight Loss as documented  Other  Clinically Undetermined  Supporting Information:  No Nutritional Consult noted  Please exercise your independent, professional judgment when responding. A specific answer is not anticipated or expected.  Thank You, Zoila Shutter RN, BSN, [place] [phone]; Cell: [phone]

## 2015-10-11 NOTE — Significant Event (Signed)
Patient requested to ambulate. Patient ambulated around the whole unit in [place] with RN and family. Tolerated ambulation well, no complaints of pain or SOB. Returned to room and settled in chair per his requests.

## 2015-10-11 NOTE — Progress Notes (Signed)
ANTICOAGULATION CONSULT NOTE - Follow Up Consult  Pharmacy Consult for Heparin  Indication: chest pain/ACS, CABG after clinical improvement   No Known Allergies  Patient Measurements: Height: 6\' 2"  (188 cm) Weight: 169 lb 12.1 oz (77 kg) IBW/kg (Calculated) : 82.2  Vital Signs: Temp: 98.3 F (36.8 C) (02/15 0839) Temp Source: Oral (02/15 0839) BP: 113/58 mmHg (02/15 0800)  Labs:  Recent Labs  10/09/15 0005  10/09/15 0009  10/10/15 0600 10/10/15 1056 10/10/15 2231 10/11/15 0300  HGB  --   < > 9.1*  --  9.7*  --   --  8.8*  HCT  --   --  26.0*  --  27.1*  --   --  25.6*  PLT  --   --  111*  --  136*  --   --  163  LABPROT 16.2*  --   --   --  16.7*  --   --   --   INR 1.29  --   --   --  1.34  --   --   --   HEPARINUNFRC 0.32  --   --   < >  --  0.26* 0.45 0.38  CREATININE 0.86  --   --   --  0.90  --   --  0.90  < > = values in this interval not displayed.  Estimated Creatinine Clearance: 78.4 mL/min (by C-G formula based on Cr of 0.9).   Assessment: S/P cath awaiting CABG after clinical improvement. Pharmacy to dose IV heparin.   Confirmatory HL therapeutic (0.38). Continue current rate. H/H stable, PLT wnl, no bleeding noted   Goal of Therapy:  Heparin level 0.3-0.7 units/ml Monitor platelets by anticoagulation protocol: Yes   Plan:  -Continue heparin at 1950 units/hr -Daily HL/CBC -Monitor for s/s of bleeding   Fard Borunda C. Lennox Grumbles, PharmD Pharmacy Resident  Pager: [phone] 10/11/2015 10:35 AM

## 2015-10-12 DIAGNOSIS — K859 Acute pancreatitis without necrosis or infection, unspecified: Secondary | ICD-10-CM

## 2015-10-12 DIAGNOSIS — E785 Hyperlipidemia, unspecified: Secondary | ICD-10-CM

## 2015-10-12 DIAGNOSIS — D649 Anemia, unspecified: Secondary | ICD-10-CM

## 2015-10-12 DIAGNOSIS — I10 Essential (primary) hypertension: Secondary | ICD-10-CM

## 2015-10-12 DIAGNOSIS — K8050 Calculus of bile duct without cholangitis or cholecystitis without obstruction: Secondary | ICD-10-CM

## 2015-10-12 DIAGNOSIS — R000 Tachycardia, unspecified: Secondary | ICD-10-CM

## 2015-10-12 DIAGNOSIS — I2510 Atherosclerotic heart disease of native coronary artery without angina pectoris: Secondary | ICD-10-CM | POA: Insufficient documentation

## 2015-10-12 DIAGNOSIS — I471 Supraventricular tachycardia: Secondary | ICD-10-CM

## 2015-10-12 LAB — PREPARE RBC (CROSSMATCH)

## 2015-10-12 LAB — COMPREHENSIVE METABOLIC PANEL
ALK PHOS: 68 U/L (ref 38–126)
ALT: 43 U/L (ref 17–63)
AST: 40 U/L (ref 15–41)
Albumin: 2.4 g/dL — ABNORMAL LOW (ref 3.5–5.0)
Anion gap: 9 (ref 5–15)
BILIRUBIN TOTAL: 1 mg/dL (ref 0.3–1.2)
BUN: 8 mg/dL (ref 6–20)
CALCIUM: 8.1 mg/dL — AB (ref 8.9–10.3)
CO2: 21 mmol/L — AB (ref 22–32)
CREATININE: 0.88 mg/dL (ref 0.61–1.24)
Chloride: 106 mmol/L (ref 101–111)
Glucose, Bld: 108 mg/dL — ABNORMAL HIGH (ref 65–99)
Potassium: 4 mmol/L (ref 3.5–5.1)
Sodium: 136 mmol/L (ref 135–145)
Total Protein: 6.4 g/dL — ABNORMAL LOW (ref 6.5–8.1)

## 2015-10-12 LAB — CBC
HEMATOCRIT: 25.3 % — AB (ref 39.0–52.0)
HEMOGLOBIN: 8.6 g/dL — AB (ref 13.0–17.0)
MCH: 31.5 pg (ref 26.0–34.0)
MCHC: 34 g/dL (ref 30.0–36.0)
MCV: 92.7 fL (ref 78.0–100.0)
Platelets: 226 10*3/uL (ref 150–400)
RBC: 2.73 MIL/uL — AB (ref 4.22–5.81)
RDW: 14.7 % (ref 11.5–15.5)
WBC: 10.3 10*3/uL (ref 4.0–10.5)

## 2015-10-12 LAB — OCCULT BLOOD X 1 CARD TO LAB, STOOL: FECAL OCCULT BLD: NEGATIVE

## 2015-10-12 LAB — TSH: TSH: 9.901 u[IU]/mL — ABNORMAL HIGH (ref 0.350–4.500)

## 2015-10-12 LAB — CULTURE, BLOOD (ROUTINE X 2)
CULTURE: NO GROWTH
CULTURE: NO GROWTH

## 2015-10-12 LAB — GLUCOSE, CAPILLARY: GLUCOSE-CAPILLARY: 112 mg/dL — AB (ref 65–99)

## 2015-10-12 LAB — SURGICAL PCR SCREEN
MRSA, PCR: NEGATIVE
Staphylococcus aureus: NEGATIVE

## 2015-10-12 LAB — AMYLASE: Amylase: 451 U/L — ABNORMAL HIGH (ref 28–100)

## 2015-10-12 LAB — ABO/RH: ABO/RH(D): A POS

## 2015-10-12 LAB — T4, FREE: FREE T4: 1.03 ng/dL (ref 0.61–1.12)

## 2015-10-12 LAB — HEPARIN LEVEL (UNFRACTIONATED): Heparin Unfractionated: 0.44 IU/mL (ref 0.30–0.70)

## 2015-10-12 MED ORDER — SODIUM CHLORIDE 0.9 % IV SOLN
INTRAVENOUS | Status: DC
  Filled 2015-10-12: qty 30

## 2015-10-12 MED ORDER — PLASMA-LYTE 148 IV SOLN
INTRAVENOUS | Status: DC
  Filled 2015-10-12: qty 2.5

## 2015-10-12 MED ORDER — NITROGLYCERIN IN D5W 200-5 MCG/ML-% IV SOLN
2.0000 ug/min | INTRAVENOUS | Status: DC
  Filled 2015-10-12: qty 250

## 2015-10-12 MED ORDER — SODIUM CHLORIDE 0.9 % IV SOLN
INTRAVENOUS | Status: DC
  Filled 2015-10-12: qty 2.5

## 2015-10-12 MED ORDER — METOPROLOL TARTRATE 12.5 MG HALF TABLET
12.5000 mg | ORAL_TABLET | Freq: Once | ORAL | Status: AC
  Administered 2015-10-13: 12.5 mg via ORAL
  Filled 2015-10-12: qty 1

## 2015-10-12 MED ORDER — DEXTROSE 5 % IV SOLN
1.5000 g | INTRAVENOUS | Status: DC
  Filled 2015-10-12: qty 1.5

## 2015-10-12 MED ORDER — CHLORHEXIDINE GLUCONATE 0.12 % MT SOLN
15.0000 mL | Freq: Once | OROMUCOSAL | Status: AC
  Administered 2015-10-13: 15 mL via OROMUCOSAL
  Filled 2015-10-12: qty 15

## 2015-10-12 MED ORDER — CHLORHEXIDINE GLUCONATE 4 % EX LIQD
60.0000 mL | Freq: Once | CUTANEOUS | Status: AC
  Administered 2015-10-13: 4 via TOPICAL
  Filled 2015-10-12: qty 60

## 2015-10-12 MED ORDER — DEXTROSE 5 % IV SOLN
750.0000 mg | INTRAVENOUS | Status: DC
  Filled 2015-10-12: qty 750

## 2015-10-12 MED ORDER — TEMAZEPAM 15 MG PO CAPS: 15.0000 mg | ORAL_CAPSULE | Freq: Once | ORAL | Status: DC | PRN

## 2015-10-12 MED ORDER — DOPAMINE-DEXTROSE 3.2-5 MG/ML-% IV SOLN
0.0000 ug/kg/min | INTRAVENOUS | Status: DC
  Filled 2015-10-12: qty 250

## 2015-10-12 MED ORDER — DIAZEPAM 5 MG PO TABS
5.0000 mg | ORAL_TABLET | Freq: Once | ORAL | Status: AC
  Administered 2015-10-13: 5 mg via ORAL
  Filled 2015-10-12: qty 1

## 2015-10-12 MED ORDER — DEXMEDETOMIDINE HCL IN NACL 400 MCG/100ML IV SOLN
0.1000 ug/kg/h | INTRAVENOUS | Status: DC
  Filled 2015-10-12: qty 100

## 2015-10-12 MED ORDER — ALPRAZOLAM 0.25 MG PO TABS: 0.2500 mg | ORAL_TABLET | ORAL | Status: DC | PRN

## 2015-10-12 MED ORDER — POTASSIUM CHLORIDE 2 MEQ/ML IV SOLN
80.0000 meq | INTRAVENOUS | Status: DC
  Filled 2015-10-12: qty 40

## 2015-10-12 MED ORDER — BISACODYL 5 MG PO TBEC
5.0000 mg | DELAYED_RELEASE_TABLET | Freq: Once | ORAL | Status: AC
  Administered 2015-10-12: 5 mg via ORAL
  Filled 2015-10-12: qty 1

## 2015-10-12 MED ORDER — EPINEPHRINE HCL 1 MG/ML IJ SOLN
0.0000 ug/min | INTRAVENOUS | Status: DC
  Filled 2015-10-12: qty 4

## 2015-10-12 MED ORDER — AMINOCAPROIC ACID 250 MG/ML IV SOLN
INTRAVENOUS | Status: DC
  Filled 2015-10-12: qty 40

## 2015-10-12 MED ORDER — MAGNESIUM SULFATE 50 % IJ SOLN
40.0000 meq | INTRAMUSCULAR | Status: DC
  Filled 2015-10-12: qty 10

## 2015-10-12 MED ORDER — PHENYLEPHRINE HCL 10 MG/ML IJ SOLN
30.0000 ug/min | INTRAVENOUS | Status: DC
  Filled 2015-10-12: qty 2

## 2015-10-12 MED ORDER — CHLORHEXIDINE GLUCONATE 4 % EX LIQD
60.0000 mL | Freq: Once | CUTANEOUS | Status: AC
  Administered 2015-10-12: 4 via TOPICAL
  Filled 2015-10-12: qty 60

## 2015-10-12 MED ORDER — VANCOMYCIN HCL 10 G IV SOLR
1250.0000 mg | INTRAVENOUS | Status: DC
  Filled 2015-10-12: qty 1250

## 2015-10-12 NOTE — Progress Notes (Signed)
[id number] Discussed importance of IS and walking after surgery. Discussed sternal precautions. Pt can reach 2250 ml on IS. Demonstrated how to get up and down adhering to sternal precautions. Pt will have 24hr/7day care after discharge as he will be with daughter. Gave [facility] booklet, care guide and put on pre op video for them to view. Will follow up after surgery. Graylon Good RN BSN 10/12/2015 11:30 AM

## 2015-10-12 NOTE — Progress Notes (Signed)
Patient: Preston Bell / Admit Date: 10/09/2015 / Date of Encounter: 10/12/2015, 8:29 AM   Subjective: No CP or SOB. Ambulated around the hall without any issues. No significant belly pain.   Objective: Telemetry: NSR/low grade sinus tach Physical Exam: Blood pressure 126/53, pulse 101, temperature 98 F (36.7 C), temperature source Oral, resp. rate 16, height 6\' 2"  (1.88 m), weight 169 lb 12.1 oz (77 kg), SpO2 94 %. General: Well developed thin WM in no acute distress. Head: Normocephalic, atraumatic, sclera non-icteric, no xanthomas, nares are without discharge. Neck: Negative for carotid bruits. JVP not elevated. Lungs: Clear bilaterally to auscultation without wheezes, rales, or rhonchi. Breathing is unlabored. Heart: RRR S1 S2 borderline elevated rate without murmurs, rubs, or gallops.  Abdomen: Soft, non-tender, non-distended with normoactive bowel sounds. No rebound/guarding. Extremities: No clubbing or cyanosis. No edema. Distal pedal pulses are 2+ and equal bilaterally. Right radial cath site without hematoma or ecchymosis; good pulse. Neuro: Alert and oriented X 3. Moves all extremities spontaneously. Psych:  Responds to questions appropriately with a normal affect.   Intake/Output Summary (Last 24 hours) at 10/12/15 0829 Last data filed at 10/12/15 0500  Gross per 24 hour  Intake   1027 ml  Output    720 ml  Net    307 ml    Inpatient Medications:  . aspirin  81 mg Oral Daily  . atorvastatin  40 mg Oral q1800  . feeding supplement  237 mL Oral TID WC  . ferrous [id number] C-folic acid  1 capsule Oral TID PC  . metoprolol tartrate  50 mg Oral BID  . mirtazapine  15 mg Oral QHS  . pantoprazole  40 mg Oral Q1200  . piperacillin-tazobactam (ZOSYN)  IV  3.375 g Intravenous 3 times per day  . potassium chloride  20 mEq Oral BID   Infusions:  . heparin 1,950 Units/hr (10/11/15 2045)    Labs:  Recent Labs  10/10/15 0600 10/11/15 0300 10/12/15 0300    NA 136 136 136  K 3.3* 3.6 4.0  CL 104 106 106  CO2 19* 21* 21*  GLUCOSE 101* 96 108*  BUN 10 6 8   CREATININE 0.90 0.90 0.88  CALCIUM 8.1* 7.7* 8.1*  MG 2.2  --   --     Recent Labs  10/11/15 0300 10/12/15 0300  AST 32 40  ALT 37 43  ALKPHOS 61 68  BILITOT 1.1 1.0  PROT 5.7* 6.4*  ALBUMIN 2.3* 2.4*    Recent Labs  10/11/15 0300 10/12/15 0300  WBC 8.7 10.3  HGB 8.8* 8.6*  HCT 25.6* 25.3*  MCV 91.8 92.7  PLT 163 226   No results for input(s): CKTOTAL, CKMB, TROPONINI in the last 72 hours. Invalid input(s): POCBNP  Recent Labs  10/10/15 0600  HGBA1C 5.7*     Radiology/Studies:  Dg Chest 1 View  10/10/2015  CLINICAL DATA:  Status post right-sided thoracentesis EXAM: CHEST 1 VIEW COMPARISON:  10/10/2015 FINDINGS: There is decrease in right-sided pleural effusion when compared with the prior exam. No pneumothorax is seen. The left lung remains clear. Drainage catheter is noted over the right upper abdomen IMPRESSION: No evidence of pneumothorax following right-sided thoracentesis. Reduction in right-sided effusion is noted. Electronically Signed   By: Inez Catalina M.D.   On: 10/10/2015 16:12   Dg Chest 2 View  10/10/2015  CLINICAL DATA:  Chest pain and weakness, history of sepsis, NSTEMI, former smoker. EXAM: CHEST  2 VIEW COMPARISON:  Portable chest x-ray dated  October 05, 2015 FINDINGS: There is interval development of moderate-sized right pleural effusion which layers inferiorly and posteriorly. Underlying atelectasis or pneumonia is suspected. The left lung is clear. The heart is top-normal in size. The pulmonary vascularity is normal. The trachea is midline. The bony thorax exhibits no acute abnormality. There is a percutaneous cholecystostomy tube present in the right upper quadrant. IMPRESSION: Probable right lower lobe pneumonia with parapneumonic effusion. There is no definite evidence of pulmonary edema. Electronically Signed   By: Marigold Mom  Martinique M.D.   On:  10/10/2015 08:15   Dg Abd 1 View  10/05/2015  CLINICAL DATA:  Abdominal pain.  Colon cancer.  Possible sepsis. EXAM: ABDOMEN - 1 VIEW COMPARISON:  Chest radiograph of same date. FINDINGS: Portable AP upright view of the abdomen pelvis. No free intraperitoneal air. A right abdominal air-fluid level could be within the proximal small bowel or distal stomach. Mild paucity of bowel gas otherwise. Exclusion of the pelvis. No abnormal abdominal calcifications. No appendicolith. Convex left lumbar spine curvature with advanced spondylosis. IMPRESSION: Nonspecific bowel gas pattern. Relative paucity of bowel gas with an isolated right abdominal fluid level. No free intraperitoneal air identified Electronically Signed   By: Abigail Miyamoto M.D.   On: 10/05/2015 09:45   Ct Chest Wo Contrast  10/10/2015  CLINICAL DATA:  Right upper quadrant pain. Status post placement of cholecystostomy tube 10/05/2015. Mildly productive cough. Shortness of breath. Sepsis. Subsequent encounter. EXAM: CT CHEST WITHOUT CONTRAST TECHNIQUE: Multidetector CT imaging of the chest was performed following the standard protocol without IV contrast. COMPARISON:  PA and lateral chest 10/10/2015 and 10/05/2015. CT abdomen and pelvis 10/05/2015. FINDINGS: The patient has small to moderate right pleural effusion. No left pleural effusion or pericardial effusion is identified. There is no axillary, hilar or mediastinal lymphadenopathy. Calcific aortic and coronary atherosclerosis is identified. Small area of calcified pleural plaque with pleural thickening is again seen posteriorly on the right. Lungs demonstrate centrilobular emphysematous disease. Airspace disease is seen in the posterior right lower lobe with some air bronchograms present. Minimal dependent atelectasis on the left is noted. The lungs are otherwise unremarkable. Visualized upper abdomen demonstrates a cholecystostomy tube in place. There is fluid in the right upper quadrant of the  abdomen extending over the dome of the liver which is new since the prior CT scan. Stranding is present in the gallbladder fossa but no fluid collection within the gallbladder fossa is identified. The gallbladder is completely decompressed. IMPRESSION: Small to moderate right pleural effusion with right lower lobe airspace disease with air bronchograms present worrisome for pneumonia. New perihepatic fluid extends over the dome of the liver and could be bile or ascitic fluid, septic or aseptic. Emphysema. Electronically Signed   By: Inge Rise M.D.   On: 10/10/2015 14:21   US Abdomen Complete  10/11/2015  CLINICAL DATA:  Pancreatitis. Acute cholecystitis is status post percutaneous cholecystostomy tube placement. EXAM: ABDOMEN ULTRASOUND COMPLETE COMPARISON:  CT abdomen and pelvis 10/05/2015 FINDINGS: Gallbladder: Decompressed gallbladder with artifact from cholecystostomy tube. No fluid collection identified in the gallbladder fossa. Common bile duct: Diameter: 4 mm Liver: Slightly increased echogenicity diffusely which may reflect mild steatosis. No focal liver lesion identified. IVC: No abnormality visualized. Pancreas: Grossly unremarkable appearance of the head and body. Tail not visualized. No fluid collection identified in the pancreatic bed. Spleen: Size and appearance within normal limits. Right Kidney: Length: 12.0 cm. Echogenicity within normal limits. No mass or hydronephrosis visualized. Left Kidney: Length: 11.9 cm. Echogenicity within  normal limits. No mass or hydronephrosis visualized. Abdominal aorta: No aneurysm identified. Mid and distal abdominal aorta not visualized. Other findings: Small amount of perihepatic free fluid. IMPRESSION: 1. Decompressed gallbladder with cholecystostomy tube in place. No biliary dilatation. 2. No pancreatic fluid collection identified.  Tail not visualized. Electronically Signed   By: Logan Bores M.D.   On: 10/11/2015 15:37   [place] Intraoperative  10/05/2015   CLINICAL DATA:  Ultrasound was provided for use by the ordering physician, and a technical charge was applied by the performing facility.  No radiologist interpretation/professional services rendered.   Ct Abdomen Pelvis W Contrast  10/05/2015  CLINICAL DATA:  Abdominal pain since 8 o'clock last night. Vomiting and diarrhea. EXAM: CT ABDOMEN AND PELVIS WITH CONTRAST TECHNIQUE: Multidetector CT imaging of the abdomen and pelvis was performed using the standard protocol following bolus administration of intravenous contrast. CONTRAST:  51mL OMNIPAQUE IOHEXOL 300 MG/ML  SOLN COMPARISON:  None. FINDINGS: Lower chest: Mild bibasilar atelectasis. The mild right basilar calcified pleural plaque with a 4.2 x 1.5 cm nodular area adjacently which may reflect focal pleural thickening versus fibrosis. Normal heart size. Mild coronary artery atherosclerotic cyst in the LAD. Hepatobiliary: Normal liver. Cholelithiasis with gallbladder wall thickening with pericholecystic fluid and surrounding inflammatory changes . Pancreas: Normal. Spleen: Normal. Adrenals/Urinary Tract: Normal adrenal glands. Normal kidneys. Normal bladder. Stomach/Bowel: No bowel wall thickening or dilatation. No pneumatosis, pneumoperitoneum or portal venous gas. Small amount of abdominal free fluid extending into the right pericolic gutter. Vascular/Lymphatic: Normal caliber abdominal aorta with atherosclerosis. No lymphadenopathy. Other: No fluid collection or hematoma. Musculoskeletal: No lytic or sclerotic osseous lesion. No aggressive osseous abnormality. 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 IMPRESSION: 1. Cholelithiasis with gallbladder wall thickening with pericholecystic fluid and surrounding inflammatory changes most concerning for acute cholecystitis. 2. Mild right basilar calcified pleural plaque with a 4.2 x 1.5 cm nodular area adjacently which may reflect focal pleural  thickening versus fibrosis. Recommend follow-up CT chest in 3-6 months. Electronically Signed   By: Kathreen Devoid   On: 10/05/2015 11:11   Ir Perc Cholecystostomy  10/05/2015  CLINICAL DATA:  Acute calculus cholecystitis. The patient is at high risk for immediate cholecystectomy and requires percutaneous cholecystostomy tube placement. EXAM: PERCUTANEOUS CHOLECYSTOSTOMY COMPARISON:  CT of the abdomen earlier today ANESTHESIA/SEDATION: 3.5 mg IV Versed; 125 mcg IV Fentanyl. Total Moderate Sedation Time 15 minutes. The patient's level of consciousness and physiologic status were continuously monitored during the procedure by Radiology nursing. CONTRAST:  69mL OMNIPAQUE IOHEXOL 300 MG/ML  SOLN MEDICATIONS: No additional medications. FLUOROSCOPY TIME:  30 seconds. PROCEDURE: The procedure, risks, benefits, and alternatives were explained to the patient. Questions regarding the procedure were encouraged and answered. The patient understands and consents to the procedure. A time-out was performed prior to the procedure. Ultrasound was used to localize the gallbladder. The right abdominal wall was prepped with chlorhexidine in a sterile fashion, and a sterile drape was applied covering the operative field. A sterile gown and sterile gloves were used for the procedure. Local anesthesia was provided with 1% Lidocaine. Ultrasound image documentation was performed. Fluoroscopy during the procedure and fluoro spot radiograph confirms appropriate catheter position. Under direct ultrasound guidance, a 21 gauge needle was advanced into the gallbladder lumen. Aspiration was performed and a bile sample sent for culture studies. A small amount of diluted contrast material was injected. A guide wire was then advanced into the gallbladder. A transitional dilator  was placed. Percutaneous tract dilatation was then performed over a guide wire to 10-French. A 10-French pigtail drainage catheter was then advanced into the gallbladder lumen  under fluoroscopy. Catheter was formed and injected with contrast material to confirm position. The catheter was flushed and connected to a gravity drainage bag. It was secured at the skin with a Prolene retention suture and Stat-Lock device. COMPLICATIONS: None FINDINGS: A cholecystostomy tube was advanced into the gallbladder lumen and formed. It is now draining bile. This tube will be left to gravity drainage. IMPRESSION: Percutaneous cholecystostomy with placement of 10-French drainage catheter into the gallbladder lumen. This was left to gravity drainage. Electronically Signed   By: Aletta Edouard M.D.   On: 10/05/2015 16:33   Dg Chest Portable 1 View  10/05/2015  CLINICAL DATA:  Vomiting. EXAM: PORTABLE CHEST 1 VIEW COMPARISON:  None. FINDINGS: The heart size and mediastinal contours are within normal limits. Both lungs are clear. No pneumothorax or pleural effusion is noted. The visualized skeletal structures are unremarkable. IMPRESSION: No acute cardiopulmonary abnormality seen. Electronically Signed   By: Marijo Conception, M.D.   On: 10/05/2015 09:44   US Thoracentesis Asp Pleural Space W/img Guide  10/10/2015  Ascencion Dike, PA-C     10/10/2015  3:55 PM Successful US guided right thoracentesis. Yielded 523mL of hazy, amber colored fluid. Pt tolerated procedure well. No immediate complications. Specimen was sent for culture as requested. CXR ordered. Ascencion Dike PA-C 10/10/2015 3:55 PM     Assessment and Plan  66M with HTN, HLD, colon CA (h/o colostomy with takedown), admitted initially to [facility] with n/v/diarrhea, found to have acute cholecystitis, lactic acidosis, leukocytosis, sepsis with blood cultures positive for Klebsiella, Enterobacteriaceae. S/p perc drain 10/05/15. Questionable episode of SVT on  Admission with + Troponin of 19 -> LHC 10/09/15 with severe distal LM and mid RCA stenosis, severely elevated LVEDP, EF 55-65%. Pre-op carotid duplex 10/10/15: 40-59% BICA. S/p R thoracentesis  10/10/15 for pleural effusion.  1. Acute cholecystitis c/b sepsis and Klebsiella bacteremia - s/p perc drain 10/05/15. Per IM.  2. NSTEMI/severe CAD - continue ASA, BB, statin, heparin. Plan CABG in AM per TCTS.  3. Anemia - gradual downtrend of Hgb since admission - was 13 (?concentrated) -> hanging in the 8 range now. No obvious evidence of bleeding. Will review with cardiology MD whether there is any indication for pre-operative transfusion. Further workup of this per IM.   4. SVT on admission with subsequent sinus tachycardia - sinus tach suspected to be related to #1, may also be related to anemia as well. Will discuss further titration of BB with MD. Check thyroid function.  Signed, Melina Copa PA-C Pager: [phone]   I have seen, examined and evaluated the patient this PM after Mrs. Dunn, PA-C.  After reviewing all the available data and chart,  I agree with her findings, examination as well as impression recommendations.  He looks much better today. Was able to cannulate in the hallway without significant problem. Clearing the pleural effusion seem to have had a dramatic effect.  Blood cultures negative. Still has some mild right quadrant pain which is expected from percutaneous drainage. Seen by general surgery yesterday who signed off. Now managed by internal medicine.  Enzymes also are improving. No significant SVT noted and heart rate better control with beta blocker. I will hold off titrating up beta blocker dose as he is pending CABG tomorrow and will likely have changes in medication postop.  No active anginal symptoms  or heart failure symptoms. Blood pressure stable.    Leonie Man, M.D., M.S. Interventional Cardiologist   Pager # [phone] Phone # [phone] [address] [place], [place] [address]

## 2015-10-12 NOTE — Progress Notes (Signed)
Procedure(s) (LRB): CORONARY ARTERY BYPASS GRAFTING (CABG) (N/A) TRANSESOPHAGEAL ECHOCARDIOGRAM (TEE) (N/A) Subjective: Clinically stable [place] of pancreas normal, CBD 4 mm Will plan CABG in am  Objective: Vital signs in last 24 hours: Temp:  [98.3 F (36.8 C)-99.5 F (37.5 C)] 98.8 F (37.1 C) (02/16 0308) Cardiac Rhythm:  [-] Sinus tachycardia (02/15 2000) Resp:  [13-28] 26 (02/16 0308) BP: (106-139)/(57-80) 139/62 mmHg (02/16 0308) SpO2:  [93 %-100 %] 93 % (02/16 0308)  Hemodynamic parameters for last 24 hours:   stable Intake/Output from previous day: 02/15 0701 - 02/16 0700 In: 1046.5 [P.O.:480; I.V.:429; IV Piggyback:137.5] Out: 1205 [Urine:1175; Drains:30] Intake/Output this shift:      Lab Results:  Recent Labs  10/11/15 0300 10/12/15 0300  WBC 8.7 10.3  HGB 8.8* 8.6*  HCT 25.6* 25.3*  PLT 163 226   BMET:  Recent Labs  10/11/15 0300 10/12/15 0300  NA 136 136  K 3.6 4.0  CL 106 106  CO2 21* 21*  GLUCOSE 96 108*  BUN 6 8  CREATININE 0.90 0.88  CALCIUM 7.7* 8.1*    PT/INR:  Recent Labs  10/10/15 0600  LABPROT 16.7*  INR 1.34   ABG    Component Value Date/Time   PHART 7.472* 10/10/2015 1210   HCO3 18.8* 10/10/2015 1210   TCO2 19.6 10/10/2015 1210   ACIDBASEDEF 4.2* 10/10/2015 1210   O2SAT 97.9 10/10/2015 1210   CBG (last 3)  No results for input(s): GLUCAP in the last 72 hours.  Assessment/Plan: S/P Procedure(s) (LRB): CORONARY ARTERY BYPASS GRAFTING (CABG) (N/A) TRANSESOPHAGEAL ECHOCARDIOGRAM (TEE) (N/A) CABG in am   LOS: 3 days    Preston Bell 10/12/2015

## 2015-10-12 NOTE — Progress Notes (Signed)
ANTICOAGULATION CONSULT NOTE - Follow Up Consult  Pharmacy Consult for Heparin  Indication: chest pain/ACS, CABG after clinical improvement   No Known Allergies  Patient Measurements: Height: 6\' 2"  (188 cm) Weight: 169 lb 12.1 oz (77 kg) IBW/kg (Calculated) : 82.2  Vital Signs: Temp: 98 F (36.7 C) (02/16 0756) Temp Source: Oral (02/16 0756) BP: 119/92 mmHg (02/16 1038) Pulse Rate: 116 (02/16 1038)  Labs:  Recent Labs  10/10/15 0600  10/10/15 2231 10/11/15 0300 10/12/15 0300  HGB 9.7*  --   --  8.8* 8.6*  HCT 27.1*  --   --  25.6* 25.3*  PLT 136*  --   --  163 226  LABPROT 16.7*  --   --   --   --   INR 1.34  --   --   --   --   HEPARINUNFRC  --   < > 0.45 0.38 0.44  CREATININE 0.90  --   --  0.90 0.88  < > = values in this interval not displayed.  Estimated Creatinine Clearance: 80.2 mL/min (by C-G formula based on Cr of 0.88).   Assessment: S/P cath awaiting CABG after clinical improvement. Pharmacy to dose IV heparin. CABG planned for 2/17.  Daily HL therapeutic (0.44). Continue current rate. H/H stable, PLT wnl, no bleeding noted   Goal of Therapy:  Heparin level 0.3-0.7 units/ml Monitor platelets by anticoagulation protocol: Yes   Plan:  -Continue heparin at 1950 units/hr -Daily HL/CBC -Monitor for s/s of bleeding  -F/U after CABG   Kenneisha Cochrane C. Lennox Grumbles, PharmD Pharmacy Resident  Pager: [phone] 10/12/2015 11:17 AM

## 2015-10-12 NOTE — Progress Notes (Signed)
PROGRESS NOTE  Preston Bell [id number] DOB: [birth year] DOA: 10/09/2015 PCP: Pcp Not In System  HPI/Recap of past 24 hours:  Feeling well, denies abdominal pain or chest pain had BM yesterday    Assessment/Plan: Principal Problem:   Cholecystitis Active Problems:   Sepsis ([place])   NSTEMI (non-ST elevated myocardial infarction) ([place])   Vomiting and diarrhea   Left main coronary artery disease: 90% LM   Right lower lobe pneumonia   Bacteremia due to Klebsiella pneumoniae   CAD, multiple vessel; 90% LM, 90% ostLAD, 90% OstD2, 80% ostCx, 95% mRCA   Pleural effusion on right - s/p pericardiocentesis   Acute diastolic heart failure ([place]) - EDP severely elevated @ Cath post NSTEMI  1. Non-STEMI. Incidentally found during hospital workup for cholecystitis. Cardiac catheterization showing 90% lesion of left main and 95% in mid RCA . Patient is completely symptom free, continue heparin drip, continue beta blocker, add low-dose aspirin and statin for secondary prevention, cardiology and cardiothoracic surgery following, as well.  Per Dr. Lucianne Lei tright patient to [place] for CABG 2-17.   2. Acute cholecystitis complicated with sepsis , Klebsiella bacteremia.  Sensitivities pending. Currently on IV Zosyn, cholecystectomy drain placed at [place], , reported poor oral intake but symptom largely controlled with  prn analgesics and antiemetics. Outpatient follow-up with surgery post CABG. Lipase 300, amylase 473. CT 2-14 showed fluid around liver dome could be bile or ascites.  Surgery consulted, appreciated recommendations.  Continue with Zosyn.  Repeated Blood cultures: 2-11 no growth to date.   3. PNA with pleural effusion: s/p US guided thoracentesis on 2/14, culture no growth to date, continue abx. MRSA PCR negative.   4. SVT/sinus tachycardia. Initially responded well to Adenosine/Cardizem at [facility], currently on beta blocker, IV Lopressor order did well, heart rate  high upon admission, tachycardia from sepsis. Cardiology following.   5. GERD. On PPI.  6. Dyslipidemia. On  Statin. Monitor LFT   7. Essential hypertension. Currently on beta blocker will monitor.  8. Hypokalemia. Replace, keep k>4, mag >2.  9. History of colon cancer, abdominal wall hernia, remote history of smoking and alcohol abuse. Outpatient follow-up with PCP and general surgery as needed.  10-Anemia: monitor hb stable at 8/ Patient on heparin.   DVT prophylaxis: on  Heparin drip  Code Status: full  Family Communication: patient and family (wife and daughter in room)  Disposition Plan: remain  Stepdown   Consultants:  Cardiology  CT surgery  Procedures: cholecystectomy drain placed at [place] US guided thoracentesis on 2/14 at [place]  Antibiotics:  Zosyn started at on admission (he was on rocephin at [place])   Objective: BP 126/53 mmHg  Pulse 101  Temp(Src) 98 F (36.7 C) (Oral)  Resp 16  Ht 6\' 2"  (1.88 m)  Wt 77 kg (169 lb 12.1 oz)  BMI 21.79 kg/m2  SpO2 94%  Intake/Output Summary (Last 24 hours) at 10/12/15 0807 Last data filed at 10/12/15 0500  Gross per 24 hour  Intake   1027 ml  Output   1205 ml  Net   -178 ml   Filed Weights   10/09/15 1200 10/10/15 2005  Weight: 78 kg (171 lb 15.3 oz) 77 kg (169 lb 12.1 oz)    Exam:   General: NAD  Cardiovascular: sinus tachcardia  Respiratory: diminished right lower lobe, no wheezing, no rhonchi  Abdomen: RUQ GB drain in place, large LLQ abdominal wall hernia.  positive BS, no significant tenderness  Musculoskeletal: No Edema  Neuro: aaox3  Data Reviewed: Basic Metabolic Panel:  Recent Labs Lab 10/05/15 2141 10/06/15 0303 10/09/15 0005 10/10/15 0600 10/11/15 0300 10/12/15 0300  NA 136 136 137 136 136 136  K 4.1 3.8 2.9* 3.3* 3.6 4.0  CL 105 103 108 104 106 106  CO2 24 27 22  19* 21* 21*  GLUCOSE 151* 125* 89 101* 96 108*  BUN 19 18 14 10 6 8   CREATININE 1.07 0.99 0.86  0.90 0.90 0.88  CALCIUM 7.9* 8.1* 7.8* 8.1* 7.7* 8.1*  MG 1.1* 2.4  --  2.2  --   --   PHOS  --  2.7  --   --   --   --    Liver Function Tests:  Recent Labs Lab 10/05/15 0839 10/06/15 0303 10/10/15 0600 10/11/15 0300 10/12/15 0300  AST 190* 174* 34 32 40  ALT 173* 103* 41 37 43  ALKPHOS 125 58 59 61 68  BILITOT 1.9* 2.2* 1.3* 1.1 1.0  PROT 8.4* 5.9* 5.8* 5.7* 6.4*  ALBUMIN 4.8 3.2* 2.2* 2.3* 2.4*    Recent Labs Lab 10/05/15 0839 10/10/15 0600 10/11/15 0300 10/11/15 1248 10/12/15 0300  LIPASE 366*  --   --  300*  --   AMYLASE  --  442* 473*  --  451*   No results for input(s): AMMONIA in the last 168 hours. CBC:  Recent Labs Lab 10/05/15 0839  10/08/15 0635 10/09/15 0009 10/10/15 0600 10/11/15 0300 10/12/15 0300  WBC 23.1*  < > 7.6 6.9 7.5 8.7 10.3  NEUTROABS 21.6*  --   --   --   --   --   --   HGB 13.2  < > 9.4* 9.1* 9.7* 8.8* 8.6*  HCT 39.3*  < > 27.1* 26.0* 27.1* 25.6* 25.3*  MCV 96.4  < > 94.4 93.1 92.2 91.8 92.7  PLT 238  < > 103* 111* 136* 163 226  < > = values in this interval not displayed. Cardiac Enzymes:    Recent Labs Lab 10/05/15 2141 10/06/15 0303 10/06/15 1119 10/06/15 1731 10/06/15 2331  TROPONINI 13.17* 19.47* 13.95* 11.27* 7.56*   BNP (last 3 results) No results for input(s): BNP in the last 8760 hours.  ProBNP (last 3 results) No results for input(s): PROBNP in the last 8760 hours.  CBG:  Recent Labs Lab 10/05/15 1731  GLUCAP 124*    Recent Results (from the past 240 hour(s))  Blood culture (routine x 2)     Status: None   Collection Time: 10/05/15  9:06 AM  Result Value Ref Range Status   Specimen Description BLOOD RIGHT IV  Final   Special Requests   Final    BOTTLES DRAWN AEROBIC AND ANAEROBIC AER 4ML ANA 1ML   Culture  Setup Time   Final    GRAM NEGATIVE RODS IN BOTH AEROBIC AND ANAEROBIC BOTTLES CRITICAL RESULT CALLED TO, READ BACK BY AND VERIFIED WITH: MATT MCBANE [id number] 10/05/15 MLZ     Culture   Final     KLEBSIELLA PNEUMONIAE IN BOTH AEROBIC AND ANAEROBIC BOTTLES    Report Status 10/07/2015 FINAL  Final   Organism ID, Bacteria KLEBSIELLA PNEUMONIAE  Final      Susceptibility   Klebsiella pneumoniae - MIC*    AMPICILLIN Value in next row Resistant      RESISTANT>=32    CEFAZOLIN Value in next row Sensitive      SENSITIVE<=4    CEFEPIME Value in next row Sensitive      SENSITIVE<=1  CEFTAZIDIME Value in next row Sensitive      SENSITIVE<=1    CEFTRIAXONE Value in next row Sensitive      SENSITIVE<=1    CIPROFLOXACIN Value in next row Sensitive      SENSITIVE<=0.25    GENTAMICIN Value in next row Sensitive      SENSITIVE<=1    IMIPENEM Value in next row Sensitive      SENSITIVE<=0.25    TRIMETH/SULFA Value in next row Sensitive      SENSITIVE<=20    AMPICILLIN/SULBACTAM Value in next row Sensitive      SENSITIVE8    PIP/TAZO Value in next row Sensitive      SENSITIVE<=4    * KLEBSIELLA PNEUMONIAE  Blood Culture ID Panel (Reflexed)     Status: Abnormal   Collection Time: 10/05/15  9:06 AM  Result Value Ref Range Status   Enterococcus species NOT DETECTED NOT DETECTED Final   Listeria monocytogenes NOT DETECTED NOT DETECTED Final   Staphylococcus species NOT DETECTED NOT DETECTED Final   Staphylococcus aureus NOT DETECTED NOT DETECTED Final   Streptococcus species NOT DETECTED NOT DETECTED Final   Streptococcus agalactiae NOT DETECTED NOT DETECTED Final   Streptococcus pneumoniae NOT DETECTED NOT DETECTED Final   Streptococcus pyogenes NOT DETECTED NOT DETECTED Final   Acinetobacter baumannii NOT DETECTED NOT DETECTED Final   Enterobacteriaceae species DETECTED (A) NOT DETECTED Final    Comment: CALLED TO MATT MCBANE AT 2255 10/05/15 MLZ    Enterobacter cloacae complex NOT DETECTED NOT DETECTED Final   Escherichia coli NOT DETECTED NOT DETECTED Final   Klebsiella oxytoca NOT DETECTED NOT DETECTED Final   Klebsiella pneumoniae DETECTED (A) NOT DETECTED Final    Comment:  CALLED TO MATT MCBANE AT 2255 10/05/15 MLZ    Proteus species NOT DETECTED NOT DETECTED Final   Serratia marcescens NOT DETECTED NOT DETECTED Final   Haemophilus influenzae NOT DETECTED NOT DETECTED Final   Neisseria meningitidis NOT DETECTED NOT DETECTED Final   Pseudomonas aeruginosa NOT DETECTED NOT DETECTED Final   Candida albicans NOT DETECTED NOT DETECTED Final   Candida glabrata NOT DETECTED NOT DETECTED Final   Candida krusei NOT DETECTED NOT DETECTED Final   Candida parapsilosis NOT DETECTED NOT DETECTED Final   Candida tropicalis NOT DETECTED NOT DETECTED Final   Carbapenem resistance NOT DETECTED NOT DETECTED Final   Methicillin resistance NOT DETECTED NOT DETECTED Final   Vancomycin resistance NOT DETECTED NOT DETECTED Final  CULTURE, BLOOD (ROUTINE X 2) w Reflex to PCR ID Panel     Status: None (Preliminary result)   Collection Time: 10/07/15 11:34 AM  Result Value Ref Range Status   Specimen Description BLOOD RIGHT ANTECUBITAL  Final   Special Requests   Final    BOTTLES DRAWN AEROBIC AND ANAEROBIC  AER 4CC ANA 1CC   Culture NO GROWTH 4 DAYS  Final   Report Status PENDING  Incomplete  CULTURE, BLOOD (ROUTINE X 2) w Reflex to PCR ID Panel     Status: None (Preliminary result)   Collection Time: 10/07/15 11:34 AM  Result Value Ref Range Status   Specimen Description BLOOD RIGHT HAND  Final   Special Requests BOTTLES DRAWN AEROBIC AND ANAEROBIC  1CC  Final   Culture NO GROWTH 4 DAYS  Final   Report Status PENDING  Incomplete  Surgical pcr screen     Status: None   Collection Time: 10/09/15  1:39 PM  Result Value Ref Range Status   MRSA, PCR NEGATIVE NEGATIVE Final  Staphylococcus aureus NEGATIVE NEGATIVE Final    Comment:        The Xpert SA Assay (FDA approved for NASAL specimens in patients over 62 years of age), is one component of a comprehensive surveillance program.  Test performance has been validated by [facility] for patients greater than or equal to 40  year old. It is not intended to diagnose infection nor to guide or monitor treatment.   Culture, body fluid-bottle     Status: None (Preliminary result)   Collection Time: 10/09/15  1:39 PM  Result Value Ref Range Status   Specimen Description FLUID BILE  Final   Special Requests BOTTLES DRAWN AEROBIC AND ANAEROBIC 10MLS  Final   Culture NO GROWTH 2 DAYS  Final   Report Status PENDING  Incomplete  Gram stain     Status: None   Collection Time: 10/09/15  1:39 PM  Result Value Ref Range Status   Specimen Description FLUID BILE  Final   Special Requests NONE  Final   Gram Stain   Final    RARE WBC PRESENT, PREDOMINANTLY PMN NO ORGANISMS SEEN    Report Status 10/09/2015 FINAL  Final  Culture, body fluid-bottle     Status: None (Preliminary result)   Collection Time: 10/10/15  3:50 PM  Result Value Ref Range Status   Specimen Description FLUID PLEURAL RIGHT  Final   Special Requests BOTTLES DRAWN AEROBIC AND ANAEROBIC 10CC  Final   Culture NO GROWTH < 12 HOURS  Final   Report Status PENDING  Incomplete  Gram stain     Status: None   Collection Time: 10/10/15  3:50 PM  Result Value Ref Range Status   Specimen Description FLUID PLEURAL RIGHT  Final   Special Requests NONE  Final   Gram Stain   Final    CYTOSPIN SMEAR WBC PRESENT,BOTH PMN AND MONONUCLEAR NO ORGANISMS SEEN    Report Status 10/11/2015 FINAL  Final  MRSA PCR Screening     Status: None   Collection Time: 10/11/15 12:53 AM  Result Value Ref Range Status   MRSA by PCR NEGATIVE NEGATIVE Final    Comment:        The GeneXpert MRSA Assay (FDA approved for NASAL specimens only), is one component of a comprehensive MRSA colonization surveillance program. It is not intended to diagnose MRSA infection nor to guide or monitor treatment for MRSA infections.   Culture, expectorated sputum-assessment     Status: None   Collection Time: 10/11/15 12:53 AM  Result Value Ref Range Status   Specimen Description  EXPECTORATED SPUTUM  Final   Special Requests NONE  Final   Sputum evaluation   Final    MICROSCOPIC FINDINGS SUGGEST THAT THIS SPECIMEN IS NOT REPRESENTATIVE OF LOWER RESPIRATORY SECRETIONS. PLEASE RECOLLECT. Gram Stain Report Called to,Read Back By and Verified With: R SHUMBER,RN @0629  10/11/15 MKELLY    Report Status 10/11/2015 FINAL  Final  Culture, expectorated sputum-assessment     Status: None   Collection Time: 10/11/15  6:15 PM  Result Value Ref Range Status   Specimen Description SPUTUM  Final   Special Requests NONE  Final   Sputum evaluation   Final    THIS SPECIMEN IS ACCEPTABLE. RESPIRATORY CULTURE REPORT TO FOLLOW.   Report Status 10/11/2015 FINAL  Final     Studies: US Abdomen Complete  10/11/2015  CLINICAL DATA:  Pancreatitis. Acute cholecystitis is status post percutaneous cholecystostomy tube placement. EXAM: ABDOMEN ULTRASOUND COMPLETE COMPARISON:  CT abdomen and pelvis  10/05/2015 FINDINGS: Gallbladder: Decompressed gallbladder with artifact from cholecystostomy tube. No fluid collection identified in the gallbladder fossa. Common bile duct: Diameter: 4 mm Liver: Slightly increased echogenicity diffusely which may reflect mild steatosis. No focal liver lesion identified. IVC: No abnormality visualized. Pancreas: Grossly unremarkable appearance of the head and body. Tail not visualized. No fluid collection identified in the pancreatic bed. Spleen: Size and appearance within normal limits. Right Kidney: Length: 12.0 cm. Echogenicity within normal limits. No mass or hydronephrosis visualized. Left Kidney: Length: 11.9 cm. Echogenicity within normal limits. No mass or hydronephrosis visualized. Abdominal aorta: No aneurysm identified. Mid and distal abdominal aorta not visualized. Other findings: Small amount of perihepatic free fluid. IMPRESSION: 1. Decompressed gallbladder with cholecystostomy tube in place. No biliary dilatation. 2. No pancreatic fluid collection identified.   Tail not visualized. Electronically Signed   By: Logan Bores M.D.   On: 10/11/2015 15:37    Scheduled Meds: . aspirin  81 mg Oral Daily  . atorvastatin  40 mg Oral q1800  . feeding supplement  237 mL Oral TID WC  . ferrous [id number] C-folic acid  1 capsule Oral TID PC  . metoprolol tartrate  50 mg Oral BID  . mirtazapine  15 mg Oral QHS  . pantoprazole  40 mg Oral Q1200  . piperacillin-tazobactam (ZOSYN)  IV  3.375 g Intravenous 3 times per day  . potassium chloride  20 mEq Oral BID    Continuous Infusions: . heparin 1,950 Units/hr (10/11/15 2045)     Time spent: 58mins  Niel Hummer A MD.  [facility] Pager [phone]. If 7PM-7AM, please contact night-coverage at [email or web address], password [facility] 10/12/2015, 8:07 AM  LOS: 3 days

## 2015-10-13 ENCOUNTER — Inpatient Hospital Stay (HOSPITAL_COMMUNITY): Payer: Medicare Other | Admitting: Certified Registered"

## 2015-10-13 ENCOUNTER — Inpatient Hospital Stay (HOSPITAL_COMMUNITY): Payer: Medicare Other

## 2015-10-13 ENCOUNTER — Encounter (HOSPITAL_COMMUNITY)
Admission: AD | Disposition: A | Discharge status: Home Health | Payer: Self-pay | Source: Other Acute Inpatient Hospital | Attending: Cardiothoracic Surgery

## 2015-10-13 DIAGNOSIS — Z951 Presence of aortocoronary bypass graft: Secondary | ICD-10-CM

## 2015-10-13 DIAGNOSIS — I2510 Atherosclerotic heart disease of native coronary artery without angina pectoris: Secondary | ICD-10-CM

## 2015-10-13 HISTORY — PX: TEE WITHOUT CARDIOVERSION: SHX5443

## 2015-10-13 HISTORY — PX: CORONARY ARTERY BYPASS GRAFT: SHX141

## 2015-10-13 LAB — POCT I-STAT 3, ART BLOOD GAS (G3+)
Acid-base deficit: 4 mmol/L — ABNORMAL HIGH (ref 0.0–2.0)
Acid-base deficit: 3 mmol/L — ABNORMAL HIGH (ref 0.0–2.0)
Acid-base deficit: 1 mmol/L (ref 0.0–2.0)
Acid-base deficit: 4 mmol/L — ABNORMAL HIGH (ref 0.0–2.0)
BICARBONATE: 23.2 meq/L (ref 20.0–24.0)
BICARBONATE: 18.7 meq/L — AB (ref 20.0–24.0)
Bicarbonate: 25.3 mEq/L — ABNORMAL HIGH (ref 20.0–24.0)
Bicarbonate: 22.5 mEq/L (ref 20.0–24.0)
Bicarbonate: 22 mEq/L (ref 20.0–24.0)
O2 SAT: 95 %
O2 SAT: 90 %
O2 Saturation: 100 %
O2 Saturation: 100 %
O2 Saturation: 92 %
PCO2 ART: 46.4 mmHg — AB (ref 35.0–45.0)
PCO2 ART: 25.7 mmHg — AB (ref 35.0–45.0)
PH ART: 7.297 — AB (ref 7.350–7.450)
PH ART: 7.306 — AB (ref 7.350–7.450)
PH ART: 7.454 — AB (ref 7.350–7.450)
PO2 ART: 362 mmHg — AB (ref 80.0–100.0)
PO2 ART: 70 mmHg — AB (ref 80.0–100.0)
TCO2: 27 mmol/L (ref 0–100)
TCO2: 24 mmol/L (ref 0–100)
TCO2: 25 mmol/L (ref 0–100)
TCO2: 23 mmol/L (ref 0–100)
TCO2: 19 mmol/L (ref 0–100)
pCO2 arterial: 46.2 mmHg — ABNORMAL HIGH (ref 35.0–45.0)
pCO2 arterial: 45.9 mmHg — ABNORMAL HIGH (ref 35.0–45.0)
pCO2 arterial: 31.3 mmHg — ABNORMAL LOW (ref 35.0–45.0)
pH, Arterial: 7.347 — ABNORMAL LOW (ref 7.350–7.450)
pH, Arterial: 7.471 — ABNORMAL HIGH (ref 7.350–7.450)
pO2, Arterial: 297 mmHg — ABNORMAL HIGH (ref 80.0–100.0)
pO2, Arterial: 72 mmHg — ABNORMAL LOW (ref 80.0–100.0)
pO2, Arterial: 54 mmHg — ABNORMAL LOW (ref 80.0–100.0)

## 2015-10-13 LAB — MAGNESIUM: Magnesium: 3.2 mg/dL — ABNORMAL HIGH (ref 1.7–2.4)

## 2015-10-13 LAB — CBC
HCT: 26.5 % — ABNORMAL LOW (ref 39.0–52.0)
HCT: 30.9 % — ABNORMAL LOW (ref 39.0–52.0)
HEMATOCRIT: 24.1 % — AB (ref 39.0–52.0)
HEMOGLOBIN: 8.2 g/dL — AB (ref 13.0–17.0)
Hemoglobin: 8.9 g/dL — ABNORMAL LOW (ref 13.0–17.0)
Hemoglobin: 10.6 g/dL — ABNORMAL LOW (ref 13.0–17.0)
MCH: 31.8 pg (ref 26.0–34.0)
MCH: 30.8 pg (ref 26.0–34.0)
MCH: 30.5 pg (ref 26.0–34.0)
MCHC: 34 g/dL (ref 30.0–36.0)
MCHC: 33.6 g/dL (ref 30.0–36.0)
MCHC: 34.3 g/dL (ref 30.0–36.0)
MCV: 93.4 fL (ref 78.0–100.0)
MCV: 91.7 fL (ref 78.0–100.0)
MCV: 88.8 fL (ref 78.0–100.0)
Platelets: 267 10*3/uL (ref 150–400)
Platelets: 181 10*3/uL (ref 150–400)
Platelets: 202 10*3/uL (ref 150–400)
RBC: 2.58 MIL/uL — AB (ref 4.22–5.81)
RBC: 2.89 MIL/uL — AB (ref 4.22–5.81)
RBC: 3.48 MIL/uL — ABNORMAL LOW (ref 4.22–5.81)
RDW: 14.8 % (ref 11.5–15.5)
RDW: 15.2 % (ref 11.5–15.5)
RDW: 16.1 % — ABNORMAL HIGH (ref 11.5–15.5)
WBC: 10.7 10*3/uL — ABNORMAL HIGH (ref 4.0–10.5)
WBC: 16.1 10*3/uL — AB (ref 4.0–10.5)
WBC: 12.9 10*3/uL — ABNORMAL HIGH (ref 4.0–10.5)

## 2015-10-13 LAB — POCT I-STAT, CHEM 8
BUN: 6 mg/dL (ref 6–20)
BUN: 6 mg/dL (ref 6–20)
BUN: 5 mg/dL — ABNORMAL LOW (ref 6–20)
BUN: 6 mg/dL (ref 6–20)
BUN: 6 mg/dL (ref 6–20)
BUN: 6 mg/dL (ref 6–20)
BUN: 8 mg/dL (ref 6–20)
CALCIUM ION: 1.2 mmol/L (ref 1.13–1.30)
CALCIUM ION: 1.16 mmol/L (ref 1.13–1.30)
CALCIUM ION: 1.07 mmol/L — AB (ref 1.13–1.30)
CHLORIDE: 105 mmol/L (ref 101–111)
CHLORIDE: 103 mmol/L (ref 101–111)
CHLORIDE: 104 mmol/L (ref 101–111)
CHLORIDE: 107 mmol/L (ref 101–111)
CREATININE: 0.5 mg/dL — AB (ref 0.61–1.24)
Calcium, Ion: 1 mmol/L — ABNORMAL LOW (ref 1.13–1.30)
Calcium, Ion: 1.03 mmol/L — ABNORMAL LOW (ref 1.13–1.30)
Calcium, Ion: 1.05 mmol/L — ABNORMAL LOW (ref 1.13–1.30)
Calcium, Ion: 1.01 mmol/L — ABNORMAL LOW (ref 1.13–1.30)
Chloride: 105 mmol/L (ref 101–111)
Chloride: 102 mmol/L (ref 101–111)
Chloride: 103 mmol/L (ref 101–111)
Creatinine, Ser: 0.6 mg/dL — ABNORMAL LOW (ref 0.61–1.24)
Creatinine, Ser: 0.5 mg/dL — ABNORMAL LOW (ref 0.61–1.24)
Creatinine, Ser: 0.5 mg/dL — ABNORMAL LOW (ref 0.61–1.24)
Creatinine, Ser: 0.5 mg/dL — ABNORMAL LOW (ref 0.61–1.24)
Creatinine, Ser: 0.6 mg/dL — ABNORMAL LOW (ref 0.61–1.24)
Creatinine, Ser: 0.6 mg/dL — ABNORMAL LOW (ref 0.61–1.24)
GLUCOSE: 99 mg/dL (ref 65–99)
GLUCOSE: 128 mg/dL — AB (ref 65–99)
GLUCOSE: 116 mg/dL — AB (ref 65–99)
Glucose, Bld: 127 mg/dL — ABNORMAL HIGH (ref 65–99)
Glucose, Bld: 143 mg/dL — ABNORMAL HIGH (ref 65–99)
Glucose, Bld: 145 mg/dL — ABNORMAL HIGH (ref 65–99)
Glucose, Bld: 145 mg/dL — ABNORMAL HIGH (ref 65–99)
HCT: 26 % — ABNORMAL LOW (ref 39.0–52.0)
HCT: 23 % — ABNORMAL LOW (ref 39.0–52.0)
HCT: 24 % — ABNORMAL LOW (ref 39.0–52.0)
HCT: 31 % — ABNORMAL LOW (ref 39.0–52.0)
HEMATOCRIT: 24 % — AB (ref 39.0–52.0)
HEMATOCRIT: 32 % — AB (ref 39.0–52.0)
HEMATOCRIT: 26 % — AB (ref 39.0–52.0)
HEMOGLOBIN: 7.8 g/dL — AB (ref 13.0–17.0)
HEMOGLOBIN: 8.2 g/dL — AB (ref 13.0–17.0)
HEMOGLOBIN: 10.9 g/dL — AB (ref 13.0–17.0)
Hemoglobin: 8.8 g/dL — ABNORMAL LOW (ref 13.0–17.0)
Hemoglobin: 8.2 g/dL — ABNORMAL LOW (ref 13.0–17.0)
Hemoglobin: 8.8 g/dL — ABNORMAL LOW (ref 13.0–17.0)
Hemoglobin: 10.5 g/dL — ABNORMAL LOW (ref 13.0–17.0)
POTASSIUM: 4.7 mmol/L (ref 3.5–5.1)
POTASSIUM: 4.9 mmol/L (ref 3.5–5.1)
POTASSIUM: 4.2 mmol/L (ref 3.5–5.1)
Potassium: 4 mmol/L (ref 3.5–5.1)
Potassium: 4 mmol/L (ref 3.5–5.1)
Potassium: 4 mmol/L (ref 3.5–5.1)
Potassium: 4.1 mmol/L (ref 3.5–5.1)
SODIUM: 137 mmol/L (ref 135–145)
SODIUM: 135 mmol/L (ref 135–145)
SODIUM: 139 mmol/L (ref 135–145)
Sodium: 138 mmol/L (ref 135–145)
Sodium: 138 mmol/L (ref 135–145)
Sodium: 136 mmol/L (ref 135–145)
Sodium: 138 mmol/L (ref 135–145)
TCO2: 23 mmol/L (ref 0–100)
TCO2: 23 mmol/L (ref 0–100)
TCO2: 26 mmol/L (ref 0–100)
TCO2: 24 mmol/L (ref 0–100)
TCO2: 24 mmol/L (ref 0–100)
TCO2: 24 mmol/L (ref 0–100)
TCO2: 21 mmol/L (ref 0–100)

## 2015-10-13 LAB — LIPID PANEL
CHOL/HDL RATIO: 5.6 ratio
CHOLESTEROL: 84 mg/dL (ref 0–200)
HDL: 15 mg/dL — AB (ref >40)
LDL Cholesterol: 46 mg/dL (ref 0–99)
Triglycerides: 117 mg/dL (ref <150)
VLDL: 23 mg/dL (ref 0–40)

## 2015-10-13 LAB — POCT I-STAT 4, (NA,K, GLUC, HGB,HCT)
GLUCOSE: 123 mg/dL — AB (ref 65–99)
HEMATOCRIT: 25 % — AB (ref 39.0–52.0)
Hemoglobin: 8.5 g/dL — ABNORMAL LOW (ref 13.0–17.0)
Potassium: 4 mmol/L (ref 3.5–5.1)
Sodium: 140 mmol/L (ref 135–145)

## 2015-10-13 LAB — GLUCOSE, CAPILLARY
GLUCOSE-CAPILLARY: 112 mg/dL — AB (ref 65–99)
Glucose-Capillary: 95 mg/dL (ref 65–99)
Glucose-Capillary: 91 mg/dL (ref 65–99)
Glucose-Capillary: 98 mg/dL (ref 65–99)
Glucose-Capillary: 103 mg/dL — ABNORMAL HIGH (ref 65–99)
Glucose-Capillary: 117 mg/dL — ABNORMAL HIGH (ref 65–99)

## 2015-10-13 LAB — BLOOD GAS, ARTERIAL
Acid-base deficit: 2.5 mmol/L — ABNORMAL HIGH (ref 0.0–2.0)
Bicarbonate: 20.9 mEq/L (ref 20.0–24.0)
Drawn by: 44166
FIO2: 0.21
O2 Saturation: 95.6 %
Patient temperature: 98.6
TCO2: 21.8 mmol/L (ref 0–100)
pCO2 arterial: 30.7 mmHg — ABNORMAL LOW (ref 35.0–45.0)
pH, Arterial: 7.448 (ref 7.350–7.450)
pO2, Arterial: 84.8 mmHg (ref 80.0–100.0)

## 2015-10-13 LAB — BASIC METABOLIC PANEL
ANION GAP: 10 (ref 5–15)
BUN: 7 mg/dL (ref 6–20)
CHLORIDE: 108 mmol/L (ref 101–111)
CO2: 20 mmol/L — ABNORMAL LOW (ref 22–32)
Calcium: 8.3 mg/dL — ABNORMAL LOW (ref 8.9–10.3)
Creatinine, Ser: 0.8 mg/dL (ref 0.61–1.24)
GFR calc Af Amer: >60 mL/min (ref >60)
GLUCOSE: 107 mg/dL — AB (ref 65–99)
POTASSIUM: 4 mmol/L (ref 3.5–5.1)
SODIUM: 138 mmol/L (ref 135–145)

## 2015-10-13 LAB — HEMOGLOBIN AND HEMATOCRIT, BLOOD
HCT: 22 % — ABNORMAL LOW (ref 39.0–52.0)
Hemoglobin: 7.4 g/dL — ABNORMAL LOW (ref 13.0–17.0)

## 2015-10-13 LAB — CREATININE, SERUM
Creatinine, Ser: 0.84 mg/dL (ref 0.61–1.24)
GFR calc Af Amer: >60 mL/min (ref >60)
GFR calc non Af Amer: >60 mL/min (ref >60)

## 2015-10-13 LAB — AMYLASE: Amylase: 372 U/L — ABNORMAL HIGH (ref 28–100)

## 2015-10-13 LAB — PLATELET COUNT: Platelets: 202 10*3/uL (ref 150–400)

## 2015-10-13 LAB — HEPARIN LEVEL (UNFRACTIONATED): Heparin Unfractionated: 0.37 IU/mL (ref 0.30–0.70)

## 2015-10-13 LAB — APTT: APTT: 37 s (ref 24–37)

## 2015-10-13 SURGERY — CORONARY ARTERY BYPASS GRAFTING (CABG)
Anesthesia: General | Site: Chest

## 2015-10-13 MED ORDER — VANCOMYCIN HCL IN DEXTROSE 1-5 GM/200ML-% IV SOLN
1000.0000 mg | Freq: Two times a day (BID) | INTRAVENOUS | Status: AC
Start: 2015-10-13 — End: 2015-10-14
  Administered 2015-10-13 – 2015-10-14 (×3): 1000 mg via INTRAVENOUS
  Filled 2015-10-13 (×3): qty 200

## 2015-10-13 MED ORDER — FENTANYL CITRATE (PF) 250 MCG/5ML IJ SOLN
INTRAMUSCULAR | Status: AC
  Filled 2015-10-13: qty 25

## 2015-10-13 MED ORDER — LACTATED RINGERS IV SOLN
INTRAVENOUS | Status: DC | PRN
  Administered 2015-10-13: 08:00:00 via INTRAVENOUS

## 2015-10-13 MED ORDER — SODIUM CHLORIDE 0.9% FLUSH
3.0000 mL | Freq: Two times a day (BID) | INTRAVENOUS | Status: DC
  Administered 2015-10-14 – 2015-10-17 (×6): 3 mL via INTRAVENOUS

## 2015-10-13 MED ORDER — PIPERACILLIN-TAZOBACTAM 3.375 G IVPB
3.3750 g | Freq: Three times a day (TID) | INTRAVENOUS | Status: DC
  Administered 2015-10-13 – 2015-10-15 (×6): 3.375 g via INTRAVENOUS
  Filled 2015-10-13 (×8): qty 50

## 2015-10-13 MED ORDER — LIDOCAINE HCL (CARDIAC) 20 MG/ML IV SOLN
INTRAVENOUS | Status: AC
  Filled 2015-10-13: qty 5

## 2015-10-13 MED ORDER — NITROGLYCERIN IN D5W 200-5 MCG/ML-% IV SOLN: 0.0000 ug/min | INTRAVENOUS | Status: DC

## 2015-10-13 MED ORDER — SODIUM CHLORIDE 0.9 % IV SOLN: 250.0000 mL | INTRAVENOUS | Status: DC

## 2015-10-13 MED ORDER — SODIUM CHLORIDE 0.9 % IV SOLN
INTRAVENOUS | Status: DC | PRN
  Administered 2015-10-13 (×2): via INTRAVENOUS

## 2015-10-13 MED ORDER — OXYCODONE HCL 5 MG PO TABS
5.0000 mg | ORAL_TABLET | ORAL | Status: DC | PRN
  Administered 2015-10-14: 5 mg via ORAL
  Administered 2015-10-14 (×2): 10 mg via ORAL
  Administered 2015-10-14: 5 mg via ORAL
  Administered 2015-10-15 – 2015-10-16 (×2): 10 mg via ORAL
  Filled 2015-10-13: qty 2
  Filled 2015-10-13: qty 1
  Filled 2015-10-13 (×2): qty 2
  Filled 2015-10-13: qty 1
  Filled 2015-10-13 (×2): qty 2

## 2015-10-13 MED ORDER — TRAMADOL HCL 50 MG PO TABS: 50.0000 mg | ORAL_TABLET | ORAL | Status: DC | PRN

## 2015-10-13 MED ORDER — PHENYLEPHRINE HCL 10 MG/ML IJ SOLN
0.0000 ug/min | INTRAVENOUS | Status: DC
  Administered 2015-10-13: 10 ug/min via INTRAVENOUS
  Administered 2015-10-13: 50 ug/min via INTRAVENOUS
  Filled 2015-10-13 (×2): qty 2

## 2015-10-13 MED ORDER — POTASSIUM CHLORIDE 10 MEQ/50ML IV SOLN
10.0000 meq | INTRAVENOUS | Status: AC
Start: 2015-10-13 — End: 2015-10-13

## 2015-10-13 MED ORDER — CHLORHEXIDINE GLUCONATE 0.12 % MT SOLN
15.0000 mL | OROMUCOSAL | Status: AC
  Administered 2015-10-13: 15 mL via OROMUCOSAL
  Filled 2015-10-13: qty 15

## 2015-10-13 MED ORDER — PROPOFOL 10 MG/ML IV BOLUS
INTRAVENOUS | Status: AC
  Filled 2015-10-13: qty 20

## 2015-10-13 MED ORDER — INSULIN REGULAR BOLUS VIA INFUSION
0.0000 [IU] | Freq: Three times a day (TID) | INTRAVENOUS | Status: DC
  Filled 2015-10-13: qty 10

## 2015-10-13 MED ORDER — LACTATED RINGERS IV SOLN
INTRAVENOUS | Status: DC
  Administered 2015-10-13: 20 mL/h via INTRAVENOUS

## 2015-10-13 MED ORDER — SODIUM CHLORIDE 0.9 % IV SOLN
INTRAVENOUS | Status: DC
  Administered 2015-10-13: 1.7 [IU]/h via INTRAVENOUS
  Filled 2015-10-13: qty 2.5

## 2015-10-13 MED ORDER — ALBUMIN HUMAN 5 % IV SOLN
250.0000 mL | INTRAVENOUS | Status: AC | PRN
  Administered 2015-10-13: 250 mL via INTRAVENOUS

## 2015-10-13 MED ORDER — MILRINONE IN DEXTROSE 20 MG/100ML IV SOLN
0.1250 ug/kg/min | INTRAVENOUS | Status: AC
  Administered 2015-10-13: .25 ug/kg/min via INTRAVENOUS
  Filled 2015-10-13: qty 100

## 2015-10-13 MED ORDER — VECURONIUM BROMIDE 10 MG IV SOLR
INTRAVENOUS | Status: DC | PRN
  Administered 2015-10-13 (×2): 5 mg via INTRAVENOUS

## 2015-10-13 MED ORDER — INSULIN REGULAR HUMAN 100 UNIT/ML IJ SOLN
250.0000 [IU] | INTRAMUSCULAR | Status: DC | PRN
  Administered 2015-10-13: 1 [IU] via INTRAVENOUS

## 2015-10-13 MED ORDER — ESMOLOL HCL 100 MG/10ML IV SOLN: INTRAVENOUS | Status: DC | PRN

## 2015-10-13 MED ORDER — ACETAMINOPHEN 160 MG/5ML PO SOLN: 1000.0000 mg | Freq: Four times a day (QID) | ORAL | Status: DC

## 2015-10-13 MED ORDER — LACTATED RINGERS IV SOLN
INTRAVENOUS | Status: DC | PRN
  Administered 2015-10-13: 07:00:00 via INTRAVENOUS

## 2015-10-13 MED ORDER — PROTAMINE SULFATE 10 MG/ML IV SOLN
INTRAVENOUS | Status: DC | PRN
  Administered 2015-10-13: 50 mg via INTRAVENOUS
  Administered 2015-10-13: 20 mg via INTRAVENOUS
  Administered 2015-10-13 (×2): 50 mg via INTRAVENOUS
  Administered 2015-10-13: 30 mg via INTRAVENOUS
  Administered 2015-10-13 (×3): 50 mg via INTRAVENOUS

## 2015-10-13 MED ORDER — ACETAMINOPHEN 160 MG/5ML PO SOLN: 650.0000 mg | Freq: Once | ORAL | Status: AC

## 2015-10-13 MED ORDER — LEVALBUTEROL HCL 1.25 MG/0.5ML IN NEBU: 1.2500 mg | INHALATION_SOLUTION | Freq: Four times a day (QID) | RESPIRATORY_TRACT | Status: DC

## 2015-10-13 MED ORDER — HEPARIN SODIUM (PORCINE) 1000 UNIT/ML IJ SOLN
INTRAMUSCULAR | Status: DC | PRN
  Administered 2015-10-13: 35000 [IU] via INTRAVENOUS
  Administered 2015-10-13: 3000 [IU] via INTRAVENOUS
  Administered 2015-10-13: 2000 [IU] via INTRAVENOUS

## 2015-10-13 MED ORDER — ANTISEPTIC ORAL RINSE SOLUTION (CORINZ)
7.0000 mL | OROMUCOSAL | Status: DC
  Administered 2015-10-13 (×2): 7 mL via OROMUCOSAL

## 2015-10-13 MED ORDER — DEXMEDETOMIDINE HCL IN NACL 200 MCG/50ML IV SOLN
0.0000 ug/kg/h | INTRAVENOUS | Status: DC
  Administered 2015-10-13: 0.7 ug/kg/h via INTRAVENOUS
  Administered 2015-10-13: 0.5 ug/kg/h via INTRAVENOUS
  Filled 2015-10-13: qty 50

## 2015-10-13 MED ORDER — PROTAMINE SULFATE 10 MG/ML IV SOLN
INTRAVENOUS | Status: AC
  Filled 2015-10-13: qty 25

## 2015-10-13 MED ORDER — LEVALBUTEROL HCL 1.25 MG/0.5ML IN NEBU
1.2500 mg | INHALATION_SOLUTION | Freq: Four times a day (QID) | RESPIRATORY_TRACT | Status: DC
Start: 2015-10-13 — End: 2015-10-15
  Administered 2015-10-13 – 2015-10-15 (×8): 1.25 mg via RESPIRATORY_TRACT
  Filled 2015-10-13 (×8): qty 0.5

## 2015-10-13 MED ORDER — NITROGLYCERIN IN D5W 200-5 MCG/ML-% IV SOLN
INTRAVENOUS | Status: DC | PRN
  Administered 2015-10-13: 5 ug/min via INTRAVENOUS

## 2015-10-13 MED ORDER — LACTATED RINGERS IV SOLN
INTRAVENOUS | Status: DC
Start: 2015-10-13 — End: 2015-10-16

## 2015-10-13 MED ORDER — VANCOMYCIN HCL 1000 MG IV SOLR
1000.0000 mg | INTRAVENOUS | Status: DC | PRN
  Administered 2015-10-13: 1250 mg via INTRAVENOUS

## 2015-10-13 MED ORDER — DEXTROSE 5 % IV SOLN
1.5000 g | INTRAVENOUS | Status: DC | PRN
  Administered 2015-10-13: .75 g via INTRAVENOUS
  Administered 2015-10-13: 1.5 g via INTRAVENOUS

## 2015-10-13 MED ORDER — MIDAZOLAM HCL 10 MG/2ML IJ SOLN
INTRAMUSCULAR | Status: AC
  Filled 2015-10-13: qty 2

## 2015-10-13 MED ORDER — ROCURONIUM BROMIDE 50 MG/5ML IV SOLN
INTRAVENOUS | Status: AC
  Filled 2015-10-13: qty 1

## 2015-10-13 MED ORDER — DOCUSATE SODIUM 100 MG PO CAPS
200.0000 mg | ORAL_CAPSULE | Freq: Every day | ORAL | Status: DC
  Administered 2015-10-14 – 2015-10-19 (×5): 200 mg via ORAL
  Filled 2015-10-13 (×6): qty 2

## 2015-10-13 MED ORDER — METOPROLOL TARTRATE 25 MG/10 ML ORAL SUSPENSION: 12.5000 mg | Freq: Two times a day (BID) | ORAL | Status: DC

## 2015-10-13 MED ORDER — LIDOCAINE HCL (CARDIAC) 20 MG/ML IV SOLN
INTRAVENOUS | Status: DC | PRN
  Administered 2015-10-13: 100 mg via INTRAVENOUS

## 2015-10-13 MED ORDER — MORPHINE SULFATE (PF) 2 MG/ML IV SOLN: 1.0000 mg | INTRAVENOUS | Status: AC | PRN

## 2015-10-13 MED ORDER — PROTAMINE SULFATE 10 MG/ML IV SOLN
INTRAVENOUS | Status: AC
  Filled 2015-10-13: qty 10

## 2015-10-13 MED ORDER — SODIUM CHLORIDE 0.9% FLUSH
3.0000 mL | INTRAVENOUS | Status: DC | PRN
  Administered 2015-10-14: 3 mL via INTRAVENOUS
  Filled 2015-10-13: qty 3

## 2015-10-13 MED ORDER — ONDANSETRON HCL 4 MG/2ML IJ SOLN
4.0000 mg | Freq: Four times a day (QID) | INTRAMUSCULAR | Status: DC | PRN
  Administered 2015-10-14: 4 mg via INTRAVENOUS
  Filled 2015-10-13: qty 2

## 2015-10-13 MED ORDER — METOPROLOL TARTRATE 12.5 MG HALF TABLET
12.5000 mg | ORAL_TABLET | Freq: Two times a day (BID) | ORAL | Status: DC
  Administered 2015-10-13 – 2015-10-17 (×8): 12.5 mg via ORAL
  Filled 2015-10-13 (×8): qty 1

## 2015-10-13 MED ORDER — ROCURONIUM BROMIDE 100 MG/10ML IV SOLN
INTRAVENOUS | Status: DC | PRN
  Administered 2015-10-13 (×2): 50 mg via INTRAVENOUS

## 2015-10-13 MED ORDER — SODIUM CHLORIDE 0.9 % IV SOLN
INTRAVENOUS | Status: DC
  Administered 2015-10-13: 20 mL/h via INTRAVENOUS

## 2015-10-13 MED ORDER — SODIUM CHLORIDE 0.9 % IJ SOLN
OROMUCOSAL | Status: DC | PRN
  Administered 2015-10-13 (×3): 4 mL via TOPICAL

## 2015-10-13 MED ORDER — ACETAMINOPHEN 500 MG PO TABS
1000.0000 mg | ORAL_TABLET | Freq: Four times a day (QID) | ORAL | Status: AC
  Administered 2015-10-13 – 2015-10-18 (×15): 1000 mg via ORAL
  Filled 2015-10-13 (×15): qty 2

## 2015-10-13 MED ORDER — ETOMIDATE 2 MG/ML IV SOLN
INTRAVENOUS | Status: DC | PRN
  Administered 2015-10-13: 16 mg via INTRAVENOUS

## 2015-10-13 MED ORDER — SODIUM CHLORIDE 0.45 % IV SOLN
INTRAVENOUS | Status: DC | PRN
  Administered 2015-10-13: 20 mL/h via INTRAVENOUS

## 2015-10-13 MED ORDER — PHENYLEPHRINE HCL 10 MG/ML IJ SOLN
10.0000 mg | INTRAVENOUS | Status: DC | PRN
  Administered 2015-10-13: 25 ug/min via INTRAVENOUS

## 2015-10-13 MED ORDER — HEMOSTATIC AGENTS (NO CHARGE) OPTIME
TOPICAL | Status: DC | PRN
  Administered 2015-10-13 (×2): 1 via TOPICAL

## 2015-10-13 MED ORDER — PANTOPRAZOLE SODIUM 40 MG PO TBEC
40.0000 mg | DELAYED_RELEASE_TABLET | Freq: Every day | ORAL | Status: DC
  Administered 2015-10-15 – 2015-10-19 (×5): 40 mg via ORAL
  Filled 2015-10-13 (×5): qty 1

## 2015-10-13 MED ORDER — ASPIRIN 81 MG PO CHEW
324.0000 mg | CHEWABLE_TABLET | Freq: Every day | ORAL | Status: DC
  Administered 2015-10-17: 324 mg
  Filled 2015-10-13: qty 4

## 2015-10-13 MED ORDER — AMINOCAPROIC ACID 250 MG/ML IV SOLN
10.0000 g | INTRAVENOUS | Status: DC | PRN
  Administered 2015-10-13: 5 g/h via INTRAVENOUS

## 2015-10-13 MED ORDER — FENTANYL CITRATE (PF) 100 MCG/2ML IJ SOLN
INTRAMUSCULAR | Status: DC | PRN
  Administered 2015-10-13: 150 ug via INTRAVENOUS
  Administered 2015-10-13: 250 ug via INTRAVENOUS
  Administered 2015-10-13: 100 ug via INTRAVENOUS
  Administered 2015-10-13: 150 ug via INTRAVENOUS
  Administered 2015-10-13: 100 ug via INTRAVENOUS
  Administered 2015-10-13 (×2): 250 ug via INTRAVENOUS

## 2015-10-13 MED ORDER — DEXTROSE 5 % IV SOLN
20.0000 mg | INTRAVENOUS | Status: DC | PRN
  Administered 2015-10-13: 25 ug/min via INTRAVENOUS

## 2015-10-13 MED ORDER — ESMOLOL HCL 100 MG/10ML IV SOLN
INTRAVENOUS | Status: DC | PRN
  Administered 2015-10-13 (×2): 10 mg via INTRAVENOUS

## 2015-10-13 MED ORDER — ACETAMINOPHEN 10 MG/ML IV SOLN
1000.0000 mg | Freq: Four times a day (QID) | INTRAVENOUS | Status: AC
  Administered 2015-10-13: 1000 mg via INTRAVENOUS
  Filled 2015-10-13: qty 100

## 2015-10-13 MED ORDER — MAGNESIUM SULFATE 4 GM/100ML IV SOLN
4.0000 g | Freq: Once | INTRAVENOUS | Status: AC
  Administered 2015-10-13: 4 g via INTRAVENOUS
  Filled 2015-10-13: qty 100

## 2015-10-13 MED ORDER — BISACODYL 10 MG RE SUPP: 10.0000 mg | Freq: Every day | RECTAL | Status: DC

## 2015-10-13 MED ORDER — INSULIN ASPART 100 UNIT/ML ~~LOC~~ SOLN
0.0000 [IU] | SUBCUTANEOUS | Status: DC
  Administered 2015-10-14 (×2): 2 [IU] via SUBCUTANEOUS

## 2015-10-13 MED ORDER — MIDAZOLAM HCL 2 MG/2ML IJ SOLN: 2.0000 mg | INTRAMUSCULAR | Status: DC | PRN

## 2015-10-13 MED ORDER — ASPIRIN EC 325 MG PO TBEC
325.0000 mg | DELAYED_RELEASE_TABLET | Freq: Every day | ORAL | Status: DC
  Administered 2015-10-14 – 2015-10-19 (×5): 325 mg via ORAL
  Filled 2015-10-13 (×5): qty 1

## 2015-10-13 MED ORDER — METOPROLOL TARTRATE 1 MG/ML IV SOLN: 2.5000 mg | INTRAVENOUS | Status: DC | PRN

## 2015-10-13 MED ORDER — DOPAMINE-DEXTROSE 3.2-5 MG/ML-% IV SOLN: 0.0000 ug/kg/min | INTRAVENOUS | Status: DC

## 2015-10-13 MED ORDER — MIDAZOLAM HCL 5 MG/5ML IJ SOLN
INTRAMUSCULAR | Status: DC | PRN
  Administered 2015-10-13 (×5): 2 mg via INTRAVENOUS

## 2015-10-13 MED ORDER — MORPHINE SULFATE (PF) 2 MG/ML IV SOLN
2.0000 mg | INTRAVENOUS | Status: DC | PRN
  Administered 2015-10-13: 4 mg via INTRAVENOUS
  Administered 2015-10-13 (×2): 2 mg via INTRAVENOUS
  Administered 2015-10-13 – 2015-10-14 (×4): 4 mg via INTRAVENOUS
  Filled 2015-10-13 (×6): qty 2

## 2015-10-13 MED ORDER — LACTATED RINGERS IV SOLN: 500.0000 mL | Freq: Once | INTRAVENOUS | Status: DC | PRN

## 2015-10-13 MED ORDER — BISACODYL 5 MG PO TBEC
10.0000 mg | DELAYED_RELEASE_TABLET | Freq: Every day | ORAL | Status: DC
  Administered 2015-10-14 – 2015-10-16 (×3): 10 mg via ORAL
  Filled 2015-10-13 (×5): qty 2

## 2015-10-13 MED ORDER — MILRINONE IN DEXTROSE 20 MG/100ML IV SOLN
0.1250 ug/kg/min | INTRAVENOUS | Status: DC
  Administered 2015-10-13: 0.3 ug/kg/min via INTRAVENOUS
  Administered 2015-10-13: 0.25 ug/kg/min via INTRAVENOUS
  Administered 2015-10-14: 0.125 ug/kg/min via INTRAVENOUS
  Filled 2015-10-13 (×2): qty 100

## 2015-10-13 MED ORDER — 0.9 % SODIUM CHLORIDE (POUR BTL) OPTIME
TOPICAL | Status: DC | PRN
  Administered 2015-10-13: 6000 mL

## 2015-10-13 MED ORDER — CHLORHEXIDINE GLUCONATE 0.12% ORAL RINSE (MEDLINE KIT)
15.0000 mL | Freq: Two times a day (BID) | OROMUCOSAL | Status: DC
  Administered 2015-10-13: 15 mL via OROMUCOSAL

## 2015-10-13 MED ORDER — ACETAMINOPHEN 650 MG RE SUPP
650.0000 mg | Freq: Once | RECTAL | Status: AC
  Administered 2015-10-13: 650 mg via RECTAL

## 2015-10-13 MED ORDER — DEXMEDETOMIDINE HCL IN NACL 400 MCG/100ML IV SOLN
INTRAVENOUS | Status: DC | PRN
  Administered 2015-10-13: .3 ug/kg/h via INTRAVENOUS

## 2015-10-13 MED ORDER — ALBUMIN HUMAN 5 % IV SOLN
INTRAVENOUS | Status: DC | PRN
  Administered 2015-10-13 (×2): via INTRAVENOUS

## 2015-10-13 MED ORDER — FAMOTIDINE IN NACL 20-0.9 MG/50ML-% IV SOLN
20.0000 mg | Freq: Two times a day (BID) | INTRAVENOUS | Status: AC
  Administered 2015-10-13: 20 mg via INTRAVENOUS

## 2015-10-13 MED ORDER — PLASMA-LYTE 148 IV SOLN
INTRAVENOUS | Status: DC | PRN
  Administered 2015-10-13: 500 mL via INTRAVASCULAR

## 2015-10-13 MED ORDER — ALBUMIN HUMAN 5 % IV SOLN: 250.0000 mL | INTRAVENOUS | Status: DC | PRN

## 2015-10-13 MED FILL — Mannitol IV Soln 20%: INTRAVENOUS | Qty: 500 | Status: AC

## 2015-10-13 MED FILL — Heparin Sodium (Porcine) Inj 1000 Unit/ML: INTRAMUSCULAR | Qty: 30 | Status: AC

## 2015-10-13 MED FILL — Electrolyte-R (PH 7.4) Solution: INTRAVENOUS | Qty: 3000 | Status: AC

## 2015-10-13 MED FILL — Lidocaine HCl IV Inj 20 MG/ML: INTRAVENOUS | Qty: 5 | Status: AC

## 2015-10-13 MED FILL — Magnesium Sulfate Inj 50%: INTRAMUSCULAR | Qty: 10 | Status: AC

## 2015-10-13 MED FILL — Heparin Sodium (Porcine) Inj 1000 Unit/ML: INTRAMUSCULAR | Qty: 20 | Status: AC

## 2015-10-13 MED FILL — Sodium Bicarbonate IV Soln 8.4%: INTRAVENOUS | Qty: 100 | Status: AC

## 2015-10-13 MED FILL — Sodium Chloride IV Soln 0.9%: INTRAVENOUS | Qty: 2000 | Status: AC

## 2015-10-13 MED FILL — Potassium Chloride Inj 2 mEq/ML: INTRAVENOUS | Qty: 40 | Status: AC

## 2015-10-13 SURGICAL SUPPLY — 93 items
ADAPTER CARDIO PERF ANTE/RETRO (ADAPTER) ×4 IMPLANT
BAG DECANTER FOR FLEXI CONT (MISCELLANEOUS) ×4 IMPLANT
BANDAGE ACE 4X5 VEL STRL LF (GAUZE/BANDAGES/DRESSINGS) ×4 IMPLANT
BANDAGE ACE 6X5 VEL STRL LF (GAUZE/BANDAGES/DRESSINGS) ×4 IMPLANT
BANDAGE ELASTIC 4 VELCRO ST LF (GAUZE/BANDAGES/DRESSINGS) ×4 IMPLANT
BANDAGE ELASTIC 6 VELCRO ST LF (GAUZE/BANDAGES/DRESSINGS) ×4 IMPLANT
BASKET HEART  (ORDER IN 25'S) (MISCELLANEOUS) ×1
BASKET HEART (ORDER IN 25'S) (MISCELLANEOUS) ×1
BASKET HEART (ORDER IN 25S) (MISCELLANEOUS) ×2 IMPLANT
BLADE STERNUM SYSTEM 6 (BLADE) ×4 IMPLANT
BLADE SURG 11 STRL SS (BLADE) ×4 IMPLANT
BLADE SURG 12 STRL SS (BLADE) ×4 IMPLANT
BLADE SURG ROTATE 9660 (MISCELLANEOUS) ×4 IMPLANT
BNDG GAUZE ELAST 4 BULKY (GAUZE/BANDAGES/DRESSINGS) ×4 IMPLANT
CANISTER SUCTION 2500CC (MISCELLANEOUS) ×4 IMPLANT
CANNULA GUNDRY RCSP 15FR (MISCELLANEOUS) ×4 IMPLANT
CATH CPB KIT VANTRIGT (MISCELLANEOUS) ×4 IMPLANT
CATH ROBINSON RED A/P 18FR (CATHETERS) ×12 IMPLANT
CATH THORACIC 36FR RT ANG (CATHETERS) ×4 IMPLANT
CLIP RETRACTION 3.0MM CORONARY (MISCELLANEOUS) ×4 IMPLANT
CLIP TI WIDE RED SMALL 24 (CLIP) ×4 IMPLANT
COVER SURGICAL LIGHT HANDLE (MISCELLANEOUS) ×4 IMPLANT
CRADLE DONUT ADULT HEAD (MISCELLANEOUS) ×4 IMPLANT
DRAIN CHANNEL 32F RND 10.7 FF (WOUND CARE) ×4 IMPLANT
DRAPE CARDIOVASCULAR INCISE (DRAPES) ×2
DRAPE SLUSH/WARMER DISC (DRAPES) ×4 IMPLANT
DRAPE SRG 135X102X78XABS (DRAPES) ×2 IMPLANT
DRSG AQUACEL AG ADV 3.5X14 (GAUZE/BANDAGES/DRESSINGS) ×4 IMPLANT
ELECT BLADE 4.0 EZ CLEAN MEGAD (MISCELLANEOUS) ×4
ELECT BLADE 6.5 EXT (BLADE) ×4 IMPLANT
ELECT CAUTERY BLADE 6.4 (BLADE) ×4 IMPLANT
ELECT REM PT RETURN 9FT ADLT (ELECTROSURGICAL) ×8
ELECTRODE BLDE 4.0 EZ CLN MEGD (MISCELLANEOUS) ×2 IMPLANT
ELECTRODE REM PT RTRN 9FT ADLT (ELECTROSURGICAL) ×4 IMPLANT
GAUZE SPONGE 4X4 12PLY STRL (GAUZE/BANDAGES/DRESSINGS) ×8 IMPLANT
GLOVE BIO SURGEON STRL SZ7.5 (GLOVE) ×12 IMPLANT
GOWN STRL REUS W/ TWL LRG LVL3 (GOWN DISPOSABLE) ×16 IMPLANT
GOWN STRL REUS W/TWL LRG LVL3 (GOWN DISPOSABLE) ×16
HEMOSTAT POWDER SURGIFOAM 1G (HEMOSTASIS) ×12 IMPLANT
HEMOSTAT SURGICEL 2X14 (HEMOSTASIS) ×4 IMPLANT
INSERT FOGARTY XLG (MISCELLANEOUS) IMPLANT
KIT BASIN OR (CUSTOM PROCEDURE TRAY) ×4 IMPLANT
KIT ROOM TURNOVER OR (KITS) ×4 IMPLANT
KIT SUCTION CATH 14FR (SUCTIONS) ×4 IMPLANT
KIT VASOVIEW W/TROCAR VH 2000 (KITS) ×4 IMPLANT
LEAD PACING MYOCARDI (MISCELLANEOUS) ×4 IMPLANT
LINE VENT (MISCELLANEOUS) ×4 IMPLANT
MARKER GRAFT CORONARY BYPASS (MISCELLANEOUS) ×12 IMPLANT
NS IRRIG 1000ML POUR BTL (IV SOLUTION) ×24 IMPLANT
PACK OPEN HEART (CUSTOM PROCEDURE TRAY) ×4 IMPLANT
PAD ARMBOARD 7.5X6 YLW CONV (MISCELLANEOUS) ×16 IMPLANT
PAD ELECT DEFIB RADIOL ZOLL (MISCELLANEOUS) ×4 IMPLANT
PENCIL BUTTON HOLSTER BLD 10FT (ELECTRODE) ×4 IMPLANT
PUNCH AORTIC ROTATE  4.5MM 8IN (MISCELLANEOUS) ×4 IMPLANT
PUNCH AORTIC ROTATE 4.0MM (MISCELLANEOUS) IMPLANT
PUNCH AORTIC ROTATE 4.5MM 8IN (MISCELLANEOUS) IMPLANT
PUNCH AORTIC ROTATE 5MM 8IN (MISCELLANEOUS) IMPLANT
SET CARDIOPLEGIA MPS 5001102 (MISCELLANEOUS) ×4 IMPLANT
SOLUTION ANTI FOG 6CC (MISCELLANEOUS) ×4 IMPLANT
SURGIFLO W/THROMBIN 8M KIT (HEMOSTASIS) ×4 IMPLANT
SUT BONE WAX W31G (SUTURE) ×4 IMPLANT
SUT MNCRL AB 4-0 PS2 18 (SUTURE) IMPLANT
SUT PROLENE 3 0 SH DA (SUTURE) IMPLANT
SUT PROLENE 3 0 SH1 36 (SUTURE) IMPLANT
SUT PROLENE 4 0 RB 1 (SUTURE) ×2
SUT PROLENE 4 0 SH DA (SUTURE) ×8 IMPLANT
SUT PROLENE 4-0 RB1 .5 CRCL 36 (SUTURE) ×2 IMPLANT
SUT PROLENE 5 0 C 1 36 (SUTURE) IMPLANT
SUT PROLENE 6 0 C 1 30 (SUTURE) ×12 IMPLANT
SUT PROLENE 6 0 CC (SUTURE) ×24 IMPLANT
SUT PROLENE 8 0 BV175 6 (SUTURE) ×12 IMPLANT
SUT PROLENE BLUE 7 0 (SUTURE) ×4 IMPLANT
SUT PROLENE POLY MONO (SUTURE) ×8 IMPLANT
SUT SILK  1 MH (SUTURE)
SUT SILK 1 MH (SUTURE) IMPLANT
SUT SILK 2 0 SH CR/8 (SUTURE) IMPLANT
SUT SILK 3 0 SH CR/8 (SUTURE) ×4 IMPLANT
SUT STEEL 6MS V (SUTURE) ×8 IMPLANT
SUT STEEL SZ 6 DBL 3X14 BALL (SUTURE) ×4 IMPLANT
SUT VIC AB 1 CTX 36 (SUTURE) ×4
SUT VIC AB 1 CTX36XBRD ANBCTR (SUTURE) ×4 IMPLANT
SUT VIC AB 2-0 CT1 27 (SUTURE) ×2
SUT VIC AB 2-0 CT1 TAPERPNT 27 (SUTURE) ×2 IMPLANT
SUT VIC AB 2-0 CTX 27 (SUTURE) IMPLANT
SUT VIC AB 3-0 X1 27 (SUTURE) ×4 IMPLANT
SUTURE E-PAK OPEN HEART (SUTURE) ×4 IMPLANT
SYSTEM SAHARA CHEST DRAIN ATS (WOUND CARE) ×4 IMPLANT
TOWEL OR 17X24 6PK STRL BLUE (TOWEL DISPOSABLE) ×8 IMPLANT
TOWEL OR 17X26 10 PK STRL BLUE (TOWEL DISPOSABLE) ×8 IMPLANT
TRAY FOLEY IC TEMP SENS 16FR (CATHETERS) ×4 IMPLANT
TUBING INSUFFLATION (TUBING) ×4 IMPLANT
UNDERPAD 30X30 INCONTINENT (UNDERPADS AND DIAPERS) ×4 IMPLANT
WATER STERILE IRR 1000ML POUR (IV SOLUTION) ×8 IMPLANT

## 2015-10-13 NOTE — Anesthesia Procedure Notes (Signed)
Procedure Name: Intubation Date/Time: 10/13/2015 7:44 AM Performed by: Lavell Luster Pre-anesthesia Checklist: Patient identified, Emergency Drugs available, Suction available, Patient being monitored and Timeout performed Patient Re-evaluated:Patient Re-evaluated prior to inductionOxygen Delivery Method: Circle system utilized Preoxygenation: Pre-oxygenation with 100% oxygen Intubation Type: IV induction Ventilation: Mask ventilation without difficulty Laryngoscope Size: Miller and 2 Grade View: Grade I Tube type: Oral Tube size: 8.5 mm Number of attempts: 1 Airway Equipment and Method: Patient positioned with wedge pillow and Stylet Placement Confirmation: ETT inserted through vocal cords under direct vision,  positive ETCO2,  CO2 detector and breath sounds checked- equal and bilateral Secured at: 22 cm Tube secured with: Tape Dental Injury: Teeth and Oropharynx as per pre-operative assessment

## 2015-10-13 NOTE — OR Nursing (Signed)
Forty-five minute call to SICU charge nurse at 1143.

## 2015-10-13 NOTE — Progress Notes (Signed)
  Echocardiogram Echocardiogram Transesophageal has been performed.  Preston Bell 10/13/2015, 8:27 AM

## 2015-10-13 NOTE — Transfer of Care (Signed)
Immediate Anesthesia Transfer of Care Note  Patient: Preston Bell  Procedure(s) Performed: Procedure(s) with comments: CORONARY ARTERY BYPASS GRAFTING (CABG), ON PUMP, TIMES THREE, USING LEFT INTERNAL MAMMARY ARTERY, RIGHT GREATER SAPHENOUS VEIN HARVESTED ENDOSCOPICALLY (N/A) - LIMA-LAD; SVG-OM; SVG-RCA TRANSESOPHAGEAL ECHOCARDIOGRAM (TEE) (N/A)  Patient Location: SICU  Anesthesia Type:General  Level of Consciousness: Patient remains intubated per anesthesia plan  Airway & Oxygen Therapy: Patient remains intubated per anesthesia plan  Post-op Assessment: Post -op Vital signs reviewed and stable  Post vital signs: stable  Last Vitals:  Filed Vitals:   10/13/15 0400 10/13/15 1245  BP: 128/58 102/56  Pulse:  87  Temp: 36.9 C   Resp: 23 12    Complications: No apparent anesthesia complications

## 2015-10-13 NOTE — Care Management Note (Signed)
Case Management Note Marvetta Gibbons RN, BSN Unit 2W-Case Manager [phone]  Patient Details  Name: Preston Bell MRN: [id number] Date of Birth: 1941-02-06  Subjective/Objective:    Pt admitted to [facility]- tx to [facility] on 2/13- post cath demonstrating LM CAD with severe RCA disease. He was admitted with Acute Cholecystitis/pancreatitis - Perc Tube. Severe sepsis - Klebsiella bacteremia. Cath for + Troponin. Now CXR suggestive of RLL PNA-- work up being done for CABG                 Action/Plan: PTA pt lived at home with daughter, daughter will provide 24 hour supervision post discharge - CM to follow for d/c needs as pt progresses   Expected Discharge Date:                  Expected Discharge Plan:  [place]  In-House Referral:     Discharge planning Services  CM Consult  Post Acute Care Choice:    Choice offered to:     DME Arranged:    DME Agency:     HH Arranged:    [place] Agency:     Status of Service:  In process, will continue to follow  Medicare Important Message Given:    Date Medicare IM Given:    Medicare IM give by:    Date Additional Medicare IM Given:    Additional Medicare Important Message give by:     If discussed at [place] of Stay Meetings, dates discussed:    Additional Comments:  Maryclare Labrador, RN 10/13/2015, 3:28 PM

## 2015-10-13 NOTE — Progress Notes (Signed)
Pharmacy Antibiotic Note  Preston Bell is a 75 y.o. male admitted on 10/09/2015 with surgical prophylaxis.  Pharmacy has been consulted for vancomycin dosing.  Plan: Vancomycin 1g IV every 12 hours for 3 doses Monitor renal function and clinical course  Height: 6\' 2"  (188 cm) Weight: 169 lb 12.1 oz (77 kg) IBW/kg (Calculated) : 82.2  Temp (24hrs), Avg:97.9 F (36.6 C), Min:96.3 F (35.7 C), Max:98.9 F (37.2 C)   Recent Labs Lab 10/09/15 0009 10/10/15 0600 10/11/15 0300 10/12/15 0300 10/13/15 0325  10/13/15 0929 10/13/15 0947 10/13/15 1049 10/13/15 1054 10/13/15 1150  WBC 6.9 7.5 8.7 10.3 10.7*  --   --   --   --   --   --   CREATININE  --  0.90 0.90 0.88 0.80  < > 0.50* 0.50* 0.50* 0.50* 0.60*  < > = values in this interval not displayed.  Estimated Creatinine Clearance: 88.2 mL/min (by C-G formula based on Cr of 0.6).    No Known Allergies   Andrey Cota. Diona Foley, PharmD, [place] Clinical Pharmacist Pager [phone] 10/13/2015 1:11 PM

## 2015-10-13 NOTE — Brief Op Note (Signed)
10/09/2015 - 10/13/2015  11:11 AM      [address]       [place] [address]             [phone]     10/09/2015 - 10/13/2015  11:12 AM  PATIENT:  Preston Bell  76 y.o. male  PRE-OPERATIVE DIAGNOSIS:  CAD LEFT MAIN DISEASE  POST-OPERATIVE DIAGNOSIS:  CAD LEFT MAIN DISEASE  PROCEDURE:  Procedure(s): CORONARY ARTERY BYPASS GRAFTING (CABG), ON PUMP, TIMES THREE, USING LEFT INTERNAL MAMMARY ARTERY, RIGHT GREATER SAPHENOUS VEIN HARVESTED ENDOSCOPICALLY (LIMA-LAD; SVG-OM; SVG-RCA) TRANSESOPHAGEAL ECHOCARDIOGRAM (TEE)  SURGEON:  Surgeon(s): Ivin Poot, MD  PHYSICIAN ASSISTANT: Dandrea Medders PA-C  ANESTHESIA:   general  PATIENT CONDITION:  ICU - intubated and hemodynamically stable.  PRE-OPERATIVE WEIGHT: [id number]  COMPLICATIONS: NO KNOWN

## 2015-10-13 NOTE — Progress Notes (Signed)
Patient underwent CABG, Patient care will be transfer to Dr Prescott Gum. Please call [facility] as needed.  [place], DM

## 2015-10-13 NOTE — Progress Notes (Signed)
UR Completed. Cindel Daugherty, RN, BSN.  [phone] 

## 2015-10-13 NOTE — Progress Notes (Signed)
CT surgery p.m. Rounds  Patient stable after multivessel CABG Minimal chest tube drainage Starting to wake up from anesthesia for extubation Temperature elevated 38. Labs are satisfactory

## 2015-10-13 NOTE — Progress Notes (Signed)
Pt extubated to 4 L [place] per rapid wean protocol. NIF -20, VC 1.3 L positive cuff leak. Incentive instructed and pt pulled 750   10/13/15 1820  Oxygen Therapy/Pulse Ox  O2 Device Nasal Cannula  O2 Therapy Oxygen  O2 Flow Rate (L/min) 4 L/min  SpO2 96 %  ml x's 5.  No distress noted. Will cont to monitor.

## 2015-10-13 NOTE — OR Nursing (Signed)
Twenty minute call to SICU charge nurse at 1212.

## 2015-10-13 NOTE — Anesthesia Preprocedure Evaluation (Addendum)
Anesthesia Evaluation  Patient identified by MRN, date of birth, ID band Patient awake    Reviewed: Allergy & Precautions, NPO status , Patient's Chart, lab work & pertinent test results  History of Anesthesia Complications (+) POST - OP SPINAL HEADACHE  Airway Mallampati: II  TM Distance: >3 FB Neck ROM: Full    Dental  (+) Dental Advisory Given, Upper Dentures, Lower Dentures   Pulmonary pneumonia, unresolved, former smoker,    Pulmonary exam normal breath sounds clear to auscultation       Cardiovascular hypertension, Pt. on medications and Pt. on home beta blockers + CAD (Left main and RCA), + Past MI and +CHF  Normal cardiovascular exam Rhythm:Regular Rate:Normal     Neuro/Psych negative neurological ROS  negative psych ROS   GI/Hepatic GERD  Medicated,Acute cholecystitis History of colectomy    Endo/Other  negative endocrine ROS  Renal/GU negative Renal ROS     Musculoskeletal negative musculoskeletal ROS (+)   Abdominal   Peds  Hematology  (+) Blood dyscrasia, anemia , Plt 267k; Hgb 8.2   Anesthesia Other Findings Day of surgery medications reviewed with the patient.  Reproductive/Obstetrics                         Anesthesia Physical Anesthesia Plan  ASA: IV  Anesthesia Plan: General   Post-op Pain Management:    Induction: Intravenous  Airway Management Planned: Oral ETT  Additional Equipment: Arterial line, CVP, PA Cath, TEE and Ultrasound Guidance Line Placement  Intra-op Plan:   Post-operative Plan: Post-operative intubation/ventilation  Informed Consent: I have reviewed the patients History and Physical, chart, labs and discussed the procedure including the risks, benefits and alternatives for the proposed anesthesia with the patient or authorized representative who has indicated his/her understanding and acceptance.   Dental advisory given  Plan Discussed  with: CRNA  Anesthesia Plan Comments: (Risks/benefits of general anesthesia discussed with patient including risk of damage to teeth, lips, gum, and tongue, nausea/vomiting, allergic reactions to medications, and the possibility of heart attack, stroke and death.  All patient questions answered.  Patient wishes to proceed.)        Anesthesia Quick Evaluation

## 2015-10-13 NOTE — Progress Notes (Signed)
The patient was examined and preop studies reviewed. There has been no change from the prior exam and the patient is ready for surgery. Plan CABG on B [place]

## 2015-10-13 NOTE — Anesthesia Postprocedure Evaluation (Signed)
Anesthesia Post Note  Patient: Preston Bell  Procedure(s) Performed: Procedure(s) (LRB): CORONARY ARTERY BYPASS GRAFTING (CABG), ON PUMP, TIMES THREE, USING LEFT INTERNAL MAMMARY ARTERY, RIGHT GREATER SAPHENOUS VEIN HARVESTED ENDOSCOPICALLY (N/A) TRANSESOPHAGEAL ECHOCARDIOGRAM (TEE) (N/A)  Patient location during evaluation: SICU Anesthesia Type: General Level of consciousness: sedated and patient remains intubated per anesthesia plan Pain management: pain level controlled Vital Signs Assessment: post-procedure vital signs reviewed and stable Respiratory status: patient remains intubated per anesthesia plan and patient on ventilator - see flowsheet for VS Cardiovascular status: stable Anesthetic complications: no    Last Vitals:  Filed Vitals:   10/13/15 1530 10/13/15 1545  BP:    Pulse: 95 93  Temp: 37.4 C 37.6 C  Resp: 17 15    Last Pain:  Filed Vitals:   10/13/15 1545  PainSc: 0-No pain                 Catalina Gravel

## 2015-10-14 ENCOUNTER — Inpatient Hospital Stay (HOSPITAL_COMMUNITY): Payer: Medicare Other

## 2015-10-14 LAB — GLUCOSE, CAPILLARY
GLUCOSE-CAPILLARY: 124 mg/dL — AB (ref 65–99)
GLUCOSE-CAPILLARY: 133 mg/dL — AB (ref 65–99)
GLUCOSE-CAPILLARY: 119 mg/dL — AB (ref 65–99)
Glucose-Capillary: 123 mg/dL — ABNORMAL HIGH (ref 65–99)
Glucose-Capillary: 132 mg/dL — ABNORMAL HIGH (ref 65–99)

## 2015-10-14 LAB — COMPREHENSIVE METABOLIC PANEL
ALT: 39 U/L (ref 17–63)
AST: 39 U/L (ref 15–41)
Albumin: 2.4 g/dL — ABNORMAL LOW (ref 3.5–5.0)
Alkaline Phosphatase: 59 U/L (ref 38–126)
Anion gap: 7 (ref 5–15)
BUN: 9 mg/dL (ref 6–20)
CO2: 21 mmol/L — ABNORMAL LOW (ref 22–32)
Calcium: 7.3 mg/dL — ABNORMAL LOW (ref 8.9–10.3)
Chloride: 107 mmol/L (ref 101–111)
Creatinine, Ser: 0.86 mg/dL (ref 0.61–1.24)
GFR calc Af Amer: >60 mL/min (ref >60)
GFR calc non Af Amer: >60 mL/min (ref >60)
Glucose, Bld: 139 mg/dL — ABNORMAL HIGH (ref 65–99)
Potassium: 4.5 mmol/L (ref 3.5–5.1)
Sodium: 135 mmol/L (ref 135–145)
Total Bilirubin: 1.7 mg/dL — ABNORMAL HIGH (ref 0.3–1.2)
Total Protein: 5.8 g/dL — ABNORMAL LOW (ref 6.5–8.1)

## 2015-10-14 LAB — CBC
HCT: 31.8 % — ABNORMAL LOW (ref 39.0–52.0)
HEMATOCRIT: 32.4 % — AB (ref 39.0–52.0)
HEMOGLOBIN: 11 g/dL — AB (ref 13.0–17.0)
Hemoglobin: 10.6 g/dL — ABNORMAL LOW (ref 13.0–17.0)
MCH: 30.5 pg (ref 26.0–34.0)
MCH: 30.2 pg (ref 26.0–34.0)
MCHC: 34 g/dL (ref 30.0–36.0)
MCHC: 33.3 g/dL (ref 30.0–36.0)
MCV: 89.8 fL (ref 78.0–100.0)
MCV: 90.6 fL (ref 78.0–100.0)
PLATELETS: 257 10*3/uL (ref 150–400)
Platelets: 227 10*3/uL (ref 150–400)
RBC: 3.61 MIL/uL — ABNORMAL LOW (ref 4.22–5.81)
RBC: 3.51 MIL/uL — ABNORMAL LOW (ref 4.22–5.81)
RDW: 16.4 % — ABNORMAL HIGH (ref 11.5–15.5)
RDW: 16.1 % — AB (ref 11.5–15.5)
WBC: 16.1 10*3/uL — ABNORMAL HIGH (ref 4.0–10.5)
WBC: 20 10*3/uL — ABNORMAL HIGH (ref 4.0–10.5)

## 2015-10-14 LAB — TYPE AND SCREEN
ABO/RH(D): A POS
Antibody Screen: NEGATIVE
Unit division: 0
Unit division: 0
Unit division: 0
Unit division: 0

## 2015-10-14 LAB — CULTURE, RESPIRATORY W GRAM STAIN

## 2015-10-14 LAB — POCT I-STAT, CHEM 8
BUN: 9 mg/dL (ref 6–20)
CALCIUM ION: 1.07 mmol/L — AB (ref 1.13–1.30)
CHLORIDE: 97 mmol/L — AB (ref 101–111)
Creatinine, Ser: 0.7 mg/dL (ref 0.61–1.24)
Glucose, Bld: 150 mg/dL — ABNORMAL HIGH (ref 65–99)
HEMATOCRIT: 35 % — AB (ref 39.0–52.0)
Hemoglobin: 11.9 g/dL — ABNORMAL LOW (ref 13.0–17.0)
POTASSIUM: 4.1 mmol/L (ref 3.5–5.1)
SODIUM: 134 mmol/L — AB (ref 135–145)
TCO2: 22 mmol/L (ref 0–100)

## 2015-10-14 LAB — CULTURE, RESPIRATORY: CULTURE: NORMAL

## 2015-10-14 LAB — CULTURE, BODY FLUID W GRAM STAIN -BOTTLE: Culture: NO GROWTH

## 2015-10-14 LAB — CREATININE, SERUM
Creatinine, Ser: 0.83 mg/dL (ref 0.61–1.24)
GFR calc Af Amer: >60 mL/min (ref >60)
GFR calc non Af Amer: >60 mL/min (ref >60)

## 2015-10-14 LAB — MAGNESIUM
Magnesium: 2.6 mg/dL — ABNORMAL HIGH (ref 1.7–2.4)
Magnesium: 2.4 mg/dL (ref 1.7–2.4)

## 2015-10-14 LAB — CULTURE, BODY FLUID-BOTTLE

## 2015-10-14 MED ORDER — FUROSEMIDE 10 MG/ML IJ SOLN
20.0000 mg | Freq: Two times a day (BID) | INTRAMUSCULAR | Status: DC
  Administered 2015-10-14 – 2015-10-15 (×3): 20 mg via INTRAVENOUS
  Filled 2015-10-14: qty 2

## 2015-10-14 MED ORDER — CETYLPYRIDINIUM CHLORIDE 0.05 % MT LIQD
7.0000 mL | Freq: Two times a day (BID) | OROMUCOSAL | Status: DC
  Administered 2015-10-14 – 2015-10-19 (×10): 7 mL via OROMUCOSAL

## 2015-10-14 MED ORDER — AMIODARONE HCL IN DEXTROSE 360-4.14 MG/200ML-% IV SOLN
30.0000 mg/h | INTRAVENOUS | Status: DC
  Administered 2015-10-15: 30 mg/h via INTRAVENOUS

## 2015-10-14 MED ORDER — INSULIN ASPART 100 UNIT/ML ~~LOC~~ SOLN
0.0000 [IU] | SUBCUTANEOUS | Status: DC
  Administered 2015-10-14 – 2015-10-15 (×4): 2 [IU] via SUBCUTANEOUS

## 2015-10-14 MED ORDER — ENSURE ENLIVE PO LIQD
237.0000 mL | Freq: Three times a day (TID) | ORAL | Status: DC
  Administered 2015-10-15 – 2015-10-19 (×6): 237 mL via ORAL

## 2015-10-14 MED ORDER — AMIODARONE HCL IN DEXTROSE 360-4.14 MG/200ML-% IV SOLN
60.0000 mg/h | INTRAVENOUS | Status: DC
  Administered 2015-10-14: 60 mg/h via INTRAVENOUS
  Filled 2015-10-14 (×2): qty 200

## 2015-10-14 NOTE — Op Note (Signed)
Preston Bell, Preston Bell NO.:  [id number]  MEDICAL RECORD NO.:  [id number]  LOCATION:  2S10C                        FACILITY:  [facility]  PHYSICIAN:  Ivin Poot, M.D.  DATE OF BIRTH:  October 19, 1940  DATE OF PROCEDURE:  10/13/2015 DATE OF DISCHARGE:                              OPERATIVE REPORT   OPERATION: 1. Coronary artery bypass grafting x3 (left internal mammary artery to     LAD, saphenous vein graft to circumflex marginal, saphenous vein     graft to right coronary artery). 2. Endoscopic harvest of right leg greater saphenous vein.  SURGEON:  Ivin Poot, M.D.  ASSISTANT:  John Giovanni, P.A.-C.  ANESTHESIA:  General by Dr. Lovette Cliche.  PREOPERATIVE DIAGNOSIS:  Severe 3-vessel coronary artery disease, status post non-ST elevation myocardial infarction with severe left main stenosis.  POSTOPERATIVE DIAGNOSIS:  Severe 3-vessel coronary artery disease, status post non-ST elevation myocardial infarction with severe left main stenosis.  CLINICAL NOTE:  The patient is a 75 year old, reformed smoker.  He presented to an outside hospital with gram-negative bacteremia from gallbladder infection and cholangitis.  He was stabilized, but he had positive cardiac enzymes.  Echocardiogram showed moderate LV dysfunction with elevated filling pressures.  Cardiac catheterization showed left main stenosis and 3-vessel disease with ejection fraction of 45-50 with elevated filling pressures.  He was felt to be a candidate for surgical revascularization once his medical condition stabilized from the gram- negative bacteremia.  I examined the patient in the ICU and discussed the results of the cardiac catheterization with the patient and family. I discussed the timing of surgery for his severe coronary disease with respect to his active gallbladder disease.  The patient had a gallbladder drain placed preoperatively by an Interventional Radiology.  I discussed the specific  details of CABG with the patient including the use of general anesthesia and cardiopulmonary bypass, the location of the surgical incisions, and the expected postoperative hospital recovery.  I reviewed the risks to him of the operation including risks of MI, stroke, bleeding, blood transfusion requirement, infection, postoperative pulmonary problems including pleural effusion, pneumothorax, and death.  After reviewing these issues, he demonstrated his understanding and agreed to proceed with surgery under what I felt was an informed consent.  OPERATIVE FINDINGS: 1. The coronaries were intramyocardial. 2. The condyle was adequate. 3. He presented to the operating room with significant anemia and     required 2 units of packed cells primed in the cardiopulmonary     bypass circuit. 4. Successful 3-vessel coronary bypass grafting with preserved LV     function by echo at the termination of the procedure.  OPERATIVE PROCEDURE:  The patient was brought to the operating room and placed supine on the operating table.  General anesthesia was induced under invasive hemodynamic monitoring.  The chest, abdomen, and legs were prepped with Betadine and draped as a sterile field.  A sternal incision was made after a proper time-out was performed.  The right leg was exposed for endoscopic vein harvest, the right leg greater saphenous vein.  The left internal mammary artery was harvested as a pedicle graft from its origin at the subclavian vessels.  It was  a good vessel with excellent flow.  There were significant dense adhesions in the left pleural space, especially at the apex.  The sternal retractor was placed.  The pericardium was opened and suspended.  The ascending aorta was mildly thickened, but not calcified or enlarged.  Pursestrings were placed in ascending aorta and right atrium and heparin was administered.  When the ACT was documented as being therapeutic, the patient was cannulated  and placed on bypass.  The coronaries were identified for grafting and cardioplegia cannulas were placed for both antegrade and retrograde cold blood cardioplegia.  The patient was cooled to 32 degrees, and the aortic crossclamp was applied. 1 L of cold blood cardioplegia was delivered in split doses between the antegrade aortic and retrograde coronary sinus catheters.  There was good cardioplegic arrest, and septal temperature dropped less than 14 degrees.  Cardioplegia was delivered every 20 minutes.  The distal coronary anastomoses were performed.  The first distal anastomosis was the right coronary.  This was intramyocardial.  It was 1.5-mm vessel proximal 90% to 95% stenosis.  A reverse saphenous vein was sewn end-to-side with running 7-0 Prolene with good flow through the graft.  Cardioplegia was redosed.  The second distal anastomosis was to the circumflex marginal.  This was a 1.5-mm vessel which was intramyocardial.  A reverse saphenous vein was sewn end-to-side with running 7-0 Prolene.  There was good flow through the graft.  Cardioplegia was redosed.  The third distal anastomosis was the distal third to the LAD.  More proximally, the LAD was intramyocardial.  The left IMA pedicle was brought through an opening and the left lateral pericardium was brought down onto the LAD and sewn end-to-side with a running 8-0 Prolene. There was good flow through the anastomosis with brief release of the vascular bulldog on the mammary pedicle.  The bulldog was reapplied and the pedicle was secured to the epicardium with 6-0 Prolenes. Cardioplegia was redosed.  With the crossclamp still in place, 2 proximal vein anastomoses were performed on the ascending aorta using a 4.5 mm punch and running 6-0 Prolene.  Prior to tying down the final proximal anastomosis, air was vented from the coronaries with a dose of retrograde warm blood cardioplegia.  The crossclamp was removed.  The heart was  cardioverted back to a regular rhythm.  The vein grafts were de-aired and opened and each had good flow.  Hemostasis was documented at the proximal distal anastomoses.  Temporary pacing wires were applied.  The patient was rewarmed and reperfused.  The lungs were expanded.  When the patient was adequately reperfused, he was weaned off cardiopulmonary bypass without difficulty with medium dose Milrinone. Cardiac function was normal by echo.  Cardiac output was 4.5 L/minute. Protamine was administered without adverse reaction.  The cannulas were removed.  The mediastinum was irrigated.  The superior pericardial fat was closed over the aorta and vein grafts.  Anterior mediastinal and left pleural chest tube were placed and brought out through separate incisions. The sternum was closed with interrupted steel wire.  The pectoralis fascia was closed with a running #1 Vicryl.  The subcutaneous and skin layers were closed with running Vicryl and a sterile dressing was applied.  Total cardiopulmonary bypass time was 100 minutes.     Ivin Poot, M.D.     PV/MEDQ  D:  10/13/2015  T:  10/14/2015  Job:  [id number]

## 2015-10-14 NOTE — Progress Notes (Signed)
1 Day Post-Op Procedure(s) (LRB): CORONARY ARTERY BYPASS GRAFTING (CABG), ON PUMP, TIMES THREE, USING LEFT INTERNAL MAMMARY ARTERY, RIGHT GREATER SAPHENOUS VEIN HARVESTED ENDOSCOPICALLY (N/A) TRANSESOPHAGEAL ECHOCARDIOGRAM (TEE) (N/A) Subjective: Status post urgent CABg Preop cholangitis with gram-negative bacteremia Patient mobilize out of bed to chair No GI complaints Continue IV Zosyn for preop cholangitis White count elevated 20,000 P.m. labs are reviewed and otherwise satisfactory Objective: Vital signs in last 24 hours: Temp:  [97.9 F (36.6 C)-101.3 F (38.5 C)] 97.9 F (36.6 C) (02/18 1556) Pulse Rate:  [101-120] 107 (02/18 1800) Cardiac Rhythm:  [-] Sinus tachycardia (02/18 1800) Resp:  [14-36] 26 (02/18 1800) BP: (90-167)/(52-78) 152/70 mmHg (02/18 1800) SpO2:  [87 %-97 %] 96 % (02/18 1800) Arterial Line BP: (59-166)/(41-86) 87/62 mmHg (02/18 0900) Weight:  [175 lb 11.3 oz (79.7 kg)] 175 lb 11.3 oz (79.7 kg) (02/18 0445)  Hemodynamic parameters for last 24 hours: PAP: (26-37)/(10-29) 26/23 mmHg CO:  [8.3 L/min-9.8 L/min] 9.8 L/min CI:  [4.1 L/min/m2-4.8 L/min/m2] 4.8 L/min/m2  Intake/Output from previous day: 02/17 0701 - 02/18 0700 In: 7243.9 [P.O.:600; I.V.:4590.9; Blood:473; NG/GT:30; IV Piggyback:1550] Out: 3310 [Urine:2470; Drains:350; Chest Tube:490] Intake/Output this shift:    Alert and appropriate Breath sounds clear Abdomen soft Extremities warm  Lab Results:  Recent Labs  10/14/15 0445 10/14/15 1630 10/14/15 1631  WBC 16.1* 20.0*  --   HGB 11.0* 10.6* 11.9*  HCT 32.4* 31.8* 35.0*  PLT 227 257  --    BMET:  Recent Labs  10/13/15 0325  10/14/15 0446 10/14/15 1630 10/14/15 1631  NA 138  < > 135  --  134*  K 4.0  < > 4.5  --  4.1  CL 108  < > 107  --  97*  CO2 20*  --  21*  --   --   GLUCOSE 107*  < > 139*  --  150*  BUN 7  < > 9  --  9  CREATININE 0.80  < > 0.86 0.83 0.70  CALCIUM 8.3*  --  7.3*  --   --   < > = values in this  interval not displayed.  PT/INR: No results for input(s): LABPROT, INR in the last 72 hours. ABG    Component Value Date/Time   PHART 7.471* 10/13/2015 1923   HCO3 18.7* 10/13/2015 1923   TCO2 22 10/14/2015 1631   ACIDBASEDEF 4.0* 10/13/2015 1923   O2SAT 90.0 10/13/2015 1923   CBG (last 3)   Recent Labs  10/14/15 0438 10/14/15 0823 10/14/15 1554  GLUCAP 123* 132* 133*    Assessment/Plan: S/P Procedure(s) (LRB): CORONARY ARTERY BYPASS GRAFTING (CABG), ON PUMP, TIMES THREE, USING LEFT INTERNAL MAMMARY ARTERY, RIGHT GREATER SAPHENOUS VEIN HARVESTED ENDOSCOPICALLY (N/A) TRANSESOPHAGEAL ECHOCARDIOGRAM (TEE) (N/A) Mobilize Diuresis Diabetes control Continue IV Zosyn for gram-negative bacteremia Advance diet slowly  LOS: 5 days    Tharon Aquas Trigt III 10/14/2015

## 2015-10-15 ENCOUNTER — Inpatient Hospital Stay (HOSPITAL_COMMUNITY): Payer: Medicare Other

## 2015-10-15 LAB — COMPREHENSIVE METABOLIC PANEL
ALT: 32 U/L (ref 17–63)
AST: 27 U/L (ref 15–41)
Albumin: 2.2 g/dL — ABNORMAL LOW (ref 3.5–5.0)
Alkaline Phosphatase: 59 U/L (ref 38–126)
Anion gap: 7 (ref 5–15)
BUN: 8 mg/dL (ref 6–20)
CO2: 25 mmol/L (ref 22–32)
Calcium: 7.4 mg/dL — ABNORMAL LOW (ref 8.9–10.3)
Chloride: 102 mmol/L (ref 101–111)
Creatinine, Ser: 0.76 mg/dL (ref 0.61–1.24)
GFR calc Af Amer: >60 mL/min (ref >60)
GFR calc non Af Amer: >60 mL/min (ref >60)
Glucose, Bld: 110 mg/dL — ABNORMAL HIGH (ref 65–99)
Potassium: 4.1 mmol/L (ref 3.5–5.1)
Sodium: 134 mmol/L — ABNORMAL LOW (ref 135–145)
Total Bilirubin: 1.2 mg/dL (ref 0.3–1.2)
Total Protein: 5.6 g/dL — ABNORMAL LOW (ref 6.5–8.1)

## 2015-10-15 LAB — GLUCOSE, CAPILLARY
GLUCOSE-CAPILLARY: 100 mg/dL — AB (ref 65–99)
GLUCOSE-CAPILLARY: 107 mg/dL — AB (ref 65–99)
GLUCOSE-CAPILLARY: 122 mg/dL — AB (ref 65–99)
Glucose-Capillary: 123 mg/dL — ABNORMAL HIGH (ref 65–99)
Glucose-Capillary: 93 mg/dL (ref 65–99)
Glucose-Capillary: 100 mg/dL — ABNORMAL HIGH (ref 65–99)

## 2015-10-15 LAB — CULTURE, BODY FLUID W GRAM STAIN -BOTTLE

## 2015-10-15 LAB — CBC
HCT: 30.5 % — ABNORMAL LOW (ref 39.0–52.0)
Hemoglobin: 10.2 g/dL — ABNORMAL LOW (ref 13.0–17.0)
MCH: 30.1 pg (ref 26.0–34.0)
MCHC: 33.4 g/dL (ref 30.0–36.0)
MCV: 90 fL (ref 78.0–100.0)
Platelets: 307 10*3/uL (ref 150–400)
RBC: 3.39 MIL/uL — ABNORMAL LOW (ref 4.22–5.81)
RDW: 15.7 % — ABNORMAL HIGH (ref 11.5–15.5)
WBC: 19.7 10*3/uL — ABNORMAL HIGH (ref 4.0–10.5)

## 2015-10-15 LAB — CULTURE, BODY FLUID-BOTTLE: CULTURE: NO GROWTH

## 2015-10-15 MED ORDER — FUROSEMIDE 10 MG/ML IJ SOLN
20.0000 mg | Freq: Two times a day (BID) | INTRAMUSCULAR | Status: AC
  Administered 2015-10-15: 20 mg via INTRAVENOUS
  Filled 2015-10-15 (×2): qty 2

## 2015-10-15 MED ORDER — PIPERACILLIN-TAZOBACTAM 3.375 G IVPB
3.3750 g | Freq: Three times a day (TID) | INTRAVENOUS | Status: AC
  Administered 2015-10-15 (×2): 3.375 g via INTRAVENOUS
  Filled 2015-10-15 (×2): qty 50

## 2015-10-15 MED ORDER — AMIODARONE HCL 200 MG PO TABS
200.0000 mg | ORAL_TABLET | Freq: Two times a day (BID) | ORAL | Status: DC
  Administered 2015-10-15 – 2015-10-19 (×9): 200 mg via ORAL
  Filled 2015-10-15 (×9): qty 1

## 2015-10-15 MED ORDER — LEVALBUTEROL HCL 1.25 MG/0.5ML IN NEBU: 1.2500 mg | INHALATION_SOLUTION | Freq: Four times a day (QID) | RESPIRATORY_TRACT | Status: DC | PRN

## 2015-10-15 MED ORDER — FUROSEMIDE 40 MG PO TABS: 40.0000 mg | ORAL_TABLET | Freq: Every day | ORAL | Status: DC

## 2015-10-15 MED ORDER — AMOXICILLIN-POT CLAVULANATE 875-125 MG PO TABS
1.0000 | ORAL_TABLET | Freq: Two times a day (BID) | ORAL | Status: DC
Start: 2015-10-16 — End: 2015-10-19
  Administered 2015-10-16 – 2015-10-19 (×7): 1 via ORAL
  Filled 2015-10-15 (×7): qty 1

## 2015-10-15 MED ORDER — BOOST / RESOURCE BREEZE PO LIQD
237.0000 mL | Freq: Three times a day (TID) | ORAL | Status: DC
  Administered 2015-10-15 – 2015-10-18 (×6): 1 via ORAL

## 2015-10-15 MED ORDER — MIRTAZAPINE 15 MG PO TABS
15.0000 mg | ORAL_TABLET | Freq: Every day | ORAL | Status: DC
  Administered 2015-10-15 – 2015-10-18 (×4): 15 mg via ORAL
  Filled 2015-10-15 (×5): qty 1

## 2015-10-15 NOTE — Progress Notes (Addendum)
2 Days Post-Op Procedure(s) (LRB): CORONARY ARTERY BYPASS GRAFTING (CABG), ON PUMP, TIMES THREE, USING LEFT INTERNAL MAMMARY ARTERY, RIGHT GREATER SAPHENOUS VEIN HARVESTED ENDOSCOPICALLY (N/A) TRANSESOPHAGEAL ECHOCARDIOGRAM (TEE) (N/A) Subjective: CABG after cholangitis, bacteremia, MI nsr on po amio No abd pain, advancing diet LFTs ok, WBC 19 k-  Afebrile  on antibiotics  Objective: Vital signs in last 24 hours: Temp:  [97.9 F (36.6 C)-99.1 F (37.3 C)] 98.1 F (36.7 C) (02/19 0325) Pulse Rate:  [52-119] 104 (02/19 0600) Cardiac Rhythm:  [-] Sinus tachycardia (02/18 2000) Resp:  [12-31] 25 (02/19 0600) BP: (120-167)/(46-78) 167/76 mmHg (02/19 0600) SpO2:  [91 %-96 %] 93 % (02/19 0746) Weight:  [176 lb 12.9 oz (80.2 kg)] 176 lb 12.9 oz (80.2 kg) (02/19 0500)  Hemodynamic parameters for last 24 hours: PAP: (26)/(23) 26/23 mmHg  Intake/Output from previous day: 02/18 0701 - 02/19 0700 In: 1972.2 [P.O.:1000; I.V.:822.2; IV Piggyback:150] Out: 4450 [Urine:3715; Drains:245; Chest Tube:490] Intake/Output this shift:    Neuro intact GB drain functional abd nontender, + BM  Lab Results:  Recent Labs  10/14/15 1630 10/14/15 1631 10/15/15 0439  WBC 20.0*  --  19.7*  HGB 10.6* 11.9* 10.2*  HCT 31.8* 35.0* 30.5*  PLT 257  --  307   BMET:  Recent Labs  10/14/15 0446  10/14/15 1631 10/15/15 0439  NA 135  --  134* 134*  K 4.5  --  4.1 4.1  CL 107  --  97* 102  CO2 21*  --   --  25  GLUCOSE 139*  --  150* 110*  BUN 9  --  9 8  CREATININE 0.86  < > 0.70 0.76  CALCIUM 7.3*  --   --  7.4*  < > = values in this interval not displayed.  PT/INR: No results for input(s): LABPROT, INR in the last 72 hours. ABG    Component Value Date/Time   PHART 7.471* 10/13/2015 1923   HCO3 18.7* 10/13/2015 1923   TCO2 22 10/14/2015 1631   ACIDBASEDEF 4.0* 10/13/2015 1923   O2SAT 90.0 10/13/2015 1923   CBG (last 3)   Recent Labs  10/14/15 1950 10/14/15 2357 10/15/15 0325   GLUCAP 119* 100* 107*    Assessment/Plan: S/P Procedure(s) (LRB): CORONARY ARTERY BYPASS GRAFTING (CABG), ON PUMP, TIMES THREE, USING LEFT INTERNAL MAMMARY ARTERY, RIGHT GREATER SAPHENOUS VEIN HARVESTED ENDOSCOPICALLY (N/A) TRANSESOPHAGEAL ECHOCARDIOGRAM (TEE) (N/A) Mobilize Diuresis Diabetes control d/c tubes/lines transition to po Augmentin in am,  DC PT  LOS: 6 days    Tharon Aquas Trigt III 10/15/2015

## 2015-10-15 NOTE — Progress Notes (Signed)
CT surgery p.m. Rounds  Maintaining sinus rhythm Walking hallway Chest tube drainage decreased Will remove this p.m. Tolerating diet without GI symptoms Biliary drain 40 cc this shift

## 2015-10-16 ENCOUNTER — Inpatient Hospital Stay (HOSPITAL_COMMUNITY): Payer: Medicare Other

## 2015-10-16 ENCOUNTER — Encounter (HOSPITAL_COMMUNITY): Payer: Self-pay | Admitting: Cardiothoracic Surgery

## 2015-10-16 LAB — COMPREHENSIVE METABOLIC PANEL
ALT: 26 U/L (ref 17–63)
AST: 24 U/L (ref 15–41)
Albumin: 2.1 g/dL — ABNORMAL LOW (ref 3.5–5.0)
Alkaline Phosphatase: 60 U/L (ref 38–126)
Anion gap: 8 (ref 5–15)
BUN: 10 mg/dL (ref 6–20)
CO2: 25 mmol/L (ref 22–32)
Calcium: 7.8 mg/dL — ABNORMAL LOW (ref 8.9–10.3)
Chloride: 101 mmol/L (ref 101–111)
Creatinine, Ser: 0.8 mg/dL (ref 0.61–1.24)
GFR calc Af Amer: >60 mL/min (ref >60)
GFR calc non Af Amer: >60 mL/min (ref >60)
Glucose, Bld: 93 mg/dL (ref 65–99)
Potassium: 3.8 mmol/L (ref 3.5–5.1)
Sodium: 134 mmol/L — ABNORMAL LOW (ref 135–145)
Total Bilirubin: 1 mg/dL (ref 0.3–1.2)
Total Protein: 6 g/dL — ABNORMAL LOW (ref 6.5–8.1)

## 2015-10-16 LAB — CBC
HCT: 31.8 % — ABNORMAL LOW (ref 39.0–52.0)
Hemoglobin: 10.5 g/dL — ABNORMAL LOW (ref 13.0–17.0)
MCH: 30 pg (ref 26.0–34.0)
MCHC: 33 g/dL (ref 30.0–36.0)
MCV: 90.9 fL (ref 78.0–100.0)
Platelets: 446 10*3/uL — ABNORMAL HIGH (ref 150–400)
RBC: 3.5 MIL/uL — ABNORMAL LOW (ref 4.22–5.81)
RDW: 15.7 % — ABNORMAL HIGH (ref 11.5–15.5)
WBC: 16.5 10*3/uL — ABNORMAL HIGH (ref 4.0–10.5)

## 2015-10-16 LAB — GLUCOSE, CAPILLARY
GLUCOSE-CAPILLARY: 88 mg/dL (ref 65–99)
GLUCOSE-CAPILLARY: 108 mg/dL — AB (ref 65–99)
Glucose-Capillary: 100 mg/dL — ABNORMAL HIGH (ref 65–99)
Glucose-Capillary: 82 mg/dL (ref 65–99)
Glucose-Capillary: 90 mg/dL (ref 65–99)
Glucose-Capillary: 94 mg/dL (ref 65–99)

## 2015-10-16 MED ORDER — INSULIN ASPART 100 UNIT/ML ~~LOC~~ SOLN: 0.0000 [IU] | Freq: Three times a day (TID) | SUBCUTANEOUS | Status: DC

## 2015-10-16 MED ORDER — MAGNESIUM HYDROXIDE 400 MG/5ML PO SUSP: 30.0000 mL | Freq: Every day | ORAL | Status: DC | PRN

## 2015-10-16 MED ORDER — FUROSEMIDE 10 MG/ML IJ SOLN
20.0000 mg | Freq: Once | INTRAMUSCULAR | Status: AC
  Administered 2015-10-16: 20 mg via INTRAVENOUS
  Filled 2015-10-16: qty 2

## 2015-10-16 MED ORDER — POTASSIUM CHLORIDE 10 MEQ/50ML IV SOLN
10.0000 meq | INTRAVENOUS | Status: AC
  Administered 2015-10-16 (×2): 10 meq via INTRAVENOUS
  Filled 2015-10-16 (×2): qty 50

## 2015-10-16 MED ORDER — SODIUM CHLORIDE 0.9% FLUSH: 3.0000 mL | INTRAVENOUS | Status: DC | PRN

## 2015-10-16 MED ORDER — ATORVASTATIN CALCIUM 20 MG PO TABS
20.0000 mg | ORAL_TABLET | Freq: Every day | ORAL | Status: DC
  Administered 2015-10-16 – 2015-10-18 (×3): 20 mg via ORAL
  Filled 2015-10-16 (×3): qty 1

## 2015-10-16 MED ORDER — FUROSEMIDE 40 MG PO TABS
40.0000 mg | ORAL_TABLET | Freq: Every day | ORAL | Status: DC
  Administered 2015-10-17: 40 mg via ORAL
  Filled 2015-10-16: qty 1

## 2015-10-16 MED ORDER — MOVING RIGHT ALONG BOOK
Freq: Once | Status: AC
  Administered 2015-10-16: 08:00:00
  Filled 2015-10-16: qty 1

## 2015-10-16 MED ORDER — GUAIFENESIN ER 600 MG PO TB12: 600.0000 mg | ORAL_TABLET | Freq: Two times a day (BID) | ORAL | Status: DC | PRN

## 2015-10-16 MED ORDER — POTASSIUM CHLORIDE CRYS ER 20 MEQ PO TBCR
20.0000 meq | EXTENDED_RELEASE_TABLET | Freq: Every day | ORAL | Status: DC
  Administered 2015-10-17: 20 meq via ORAL
  Filled 2015-10-16: qty 1

## 2015-10-16 MED ORDER — SODIUM CHLORIDE 0.9 % IV SOLN: 250.0000 mL | INTRAVENOUS | Status: DC | PRN

## 2015-10-16 MED ORDER — SODIUM CHLORIDE 0.9% FLUSH
3.0000 mL | Freq: Two times a day (BID) | INTRAVENOUS | Status: DC
  Administered 2015-10-17 – 2015-10-18 (×2): 3 mL via INTRAVENOUS

## 2015-10-16 NOTE — Progress Notes (Signed)
UR Completed. Kendan Cornforth, RN, BSN.  [phone] 

## 2015-10-16 NOTE — Progress Notes (Signed)
3 Days Post-Op Procedure(s) (LRB): CORONARY ARTERY BYPASS GRAFTING (CABG), ON PUMP, TIMES THREE, USING LEFT INTERNAL MAMMARY ARTERY, RIGHT GREATER SAPHENOUS VEIN HARVESTED ENDOSCOPICALLY (N/A) TRANSESOPHAGEAL ECHOCARDIOGRAM (TEE) (N/A) Subjective: Progressing after CABG- nsr COPD preop sepsis, cholangitis, percutaneous GB drain by IR,   Blood cultures + Klebsiella  Objective: Vital signs in last 24 hours: Temp:  [97.4 F (36.3 C)-98.5 F (36.9 C)] 98.2 F (36.8 C) (02/20 0400) Pulse Rate:  [81-106] 103 (02/20 0700) Cardiac Rhythm:  [-] Normal sinus rhythm;Sinus tachycardia (02/20 0000) Resp:  [14-32] 24 (02/20 0700) BP: (107-152)/(41-80) 142/70 mmHg (02/20 0700) SpO2:  [90 %-99 %] 95 % (02/20 0700) Weight:  [170 lb 4.8 oz (77.248 kg)] 170 lb 4.8 oz (77.248 kg) (02/20 0500)  Hemodynamic parameters for last 24 hours:  stable  Intake/Output from previous day: 02/19 0701 - 02/20 0700 In: 870.8 [P.O.:400; I.V.:370.8; IV Piggyback:100] Out: [id number] [Urine:4950; Drains:210; Chest Tube:110] Intake/Output this shift:        Exam    General- alert and comfortable   Lungs- clear without rales, wheezes   Cor- regular rate and rhythm, no murmur , gallop   Abdomen- soft, non-tender, GB drain in place   Extremities - warm, non-tender, minimal edema   Neuro- oriented, appropriate, no focal weakness    Lab Results:  Recent Labs  10/15/15 0439 10/16/15 0405  WBC 19.7* 16.5*  HGB 10.2* 10.5*  HCT 30.5* 31.8*  PLT 307 446*   BMET:  Recent Labs  10/15/15 0439 10/16/15 0405  NA 134* 134*  K 4.1 3.8  CL 102 101  CO2 25 25  GLUCOSE 110* 93  BUN 8 10  CREATININE 0.76 0.80  CALCIUM 7.4* 7.8*    PT/INR: No results for input(s): LABPROT, INR in the last 72 hours. ABG    Component Value Date/Time   PHART 7.471* 10/13/2015 1923   HCO3 18.7* 10/13/2015 1923   TCO2 22 10/14/2015 1631   ACIDBASEDEF 4.0* 10/13/2015 1923   O2SAT 90.0 10/13/2015 1923   CBG (last 3)   Recent  Labs  10/15/15 2012 10/16/15 10/16/15 0405  GLUCAP 122* 90 82    Assessment/Plan: S/P Procedure(s) (LRB): CORONARY ARTERY BYPASS GRAFTING (CABG), ON PUMP, TIMES THREE, USING LEFT INTERNAL MAMMARY ARTERY, RIGHT GREATER SAPHENOUS VEIN HARVESTED ENDOSCOPICALLY (N/A) TRANSESOPHAGEAL ECHOCARDIOGRAM (TEE) (N/A) tx to stepdown Follow WBC, LFTs  cont po Augmentin SSI + lantus for DM   LOS: 7 days    Preston Bell 10/16/2015

## 2015-10-16 NOTE — Progress Notes (Signed)
[facility] to see to offer walk. Pt stated he walked over and walked in early morning. Encouraged walk before bed with staff. We will follow up tomorrow. Graylon Good RN BSN 10/16/2015 2:02 PM

## 2015-10-16 NOTE — Progress Notes (Signed)
Patient transferred to 2W25, belongings at bedside, SCDs at bedside, receiving RN and NT in room, patient placed on tele, no further questions from RN or patient.  Rowe Pavy, RN

## 2015-10-16 NOTE — Care Management Important Message (Signed)
Important Message  Patient Details  Name: Preston Bell MRN: [id number] Date of Birth: 30-Sep-1940   Medicare Important Message Given:  Yes    Loann Quill 10/16/2015, 11:01 AM

## 2015-10-17 ENCOUNTER — Inpatient Hospital Stay (HOSPITAL_COMMUNITY): Payer: Medicare Other

## 2015-10-17 LAB — COMPREHENSIVE METABOLIC PANEL
ALT: 23 U/L (ref 17–63)
AST: 27 U/L (ref 15–41)
Albumin: 2.2 g/dL — ABNORMAL LOW (ref 3.5–5.0)
Alkaline Phosphatase: 63 U/L (ref 38–126)
Anion gap: 11 (ref 5–15)
BUN: 12 mg/dL (ref 6–20)
CO2: 23 mmol/L (ref 22–32)
Calcium: 8.3 mg/dL — ABNORMAL LOW (ref 8.9–10.3)
Chloride: 102 mmol/L (ref 101–111)
Creatinine, Ser: 0.79 mg/dL (ref 0.61–1.24)
GFR calc Af Amer: >60 mL/min (ref >60)
GFR calc non Af Amer: >60 mL/min (ref >60)
Glucose, Bld: 98 mg/dL (ref 65–99)
Potassium: 3.7 mmol/L (ref 3.5–5.1)
Sodium: 136 mmol/L (ref 135–145)
Total Bilirubin: 1 mg/dL (ref 0.3–1.2)
Total Protein: 6.2 g/dL — ABNORMAL LOW (ref 6.5–8.1)

## 2015-10-17 LAB — CBC
HCT: 33.6 % — ABNORMAL LOW (ref 39.0–52.0)
Hemoglobin: 11.1 g/dL — ABNORMAL LOW (ref 13.0–17.0)
MCH: 30 pg (ref 26.0–34.0)
MCHC: 33 g/dL (ref 30.0–36.0)
MCV: 90.8 fL (ref 78.0–100.0)
Platelets: 586 10*3/uL — ABNORMAL HIGH (ref 150–400)
RBC: 3.7 MIL/uL — ABNORMAL LOW (ref 4.22–5.81)
RDW: 14.9 % (ref 11.5–15.5)
WBC: 11.9 10*3/uL — ABNORMAL HIGH (ref 4.0–10.5)

## 2015-10-17 LAB — GLUCOSE, CAPILLARY
GLUCOSE-CAPILLARY: 89 mg/dL (ref 65–99)
GLUCOSE-CAPILLARY: 88 mg/dL (ref 65–99)
GLUCOSE-CAPILLARY: 109 mg/dL — AB (ref 65–99)
Glucose-Capillary: 92 mg/dL (ref 65–99)

## 2015-10-17 MED ORDER — METOPROLOL TARTRATE 25 MG PO TABS
25.0000 mg | ORAL_TABLET | Freq: Two times a day (BID) | ORAL | Status: DC
  Administered 2015-10-17: 25 mg via ORAL
  Filled 2015-10-17: qty 1

## 2015-10-17 NOTE — Progress Notes (Addendum)
[address]       [place] [address]             [phone]      4 Days Post-Op Procedure(s) (LRB): CORONARY ARTERY BYPASS GRAFTING (CABG), ON PUMP, TIMES THREE, USING LEFT INTERNAL MAMMARY ARTERY, RIGHT GREATER SAPHENOUS VEIN HARVESTED ENDOSCOPICALLY (N/A) TRANSESOPHAGEAL ECHOCARDIOGRAM (TEE) (N/A) Subjective:  says he feels well. No abdominal symptoms, some intermit PAF  Objective: Vital signs in last 24 hours: Temp:  [97.8 F (36.6 C)-99.3 F (37.4 C)] 98.6 F (37 C) (02/21 0450) Pulse Rate:  [101-110] 110 (02/21 0450) Cardiac Rhythm:  [-] Sinus tachycardia (02/20 2007) Resp:  [18-26] 18 (02/21 0450) BP: (99-159)/(36-84) 133/66 mmHg (02/21 0450) SpO2:  [91 %-96 %] 93 % (02/21 0450) Weight:  [165 lb (74.844 kg)] 165 lb (74.844 kg) (02/21 0448)  Hemodynamic parameters for last 24 hours:    Intake/Output from previous day: 02/20 0701 - 02/21 0700 In: 41 [P.O.:480; IV Piggyback:100] Out: 3240 [Urine:3000; Drains:240] Intake/Output this shift:    General appearance: alert, cooperative and no distress Heart: regular rate and rhythm and tachy Lungs: dim in right base Abdomen: soft, non tender Extremities: no edema Wound: incis healing well  Lab Results:  Recent Labs  10/16/15 0405 10/17/15 0347  WBC 16.5* 11.9*  HGB 10.5* 11.1*  HCT 31.8* 33.6*  PLT 446* 586*   BMET:  Recent Labs  10/16/15 0405 10/17/15 0347  NA 134* 136  K 3.8 3.7  CL 101 102  CO2 25 23  GLUCOSE 93 98  BUN 10 12  CREATININE 0.80 0.79  CALCIUM 7.8* 8.3*    PT/INR: No results for input(s): LABPROT, INR in the last 72 hours. ABG    Component Value Date/Time   PHART 7.471* 10/13/2015 1923   HCO3 18.7* 10/13/2015 1923   TCO2 22 10/14/2015 1631   ACIDBASEDEF 4.0* 10/13/2015 1923   O2SAT 90.0 10/13/2015 1923   CBG (last 3)   Recent Labs  10/16/15 1610 10/16/15 2137 10/17/15 0629  GLUCAP 94 100* 92    Meds Scheduled Meds: . acetaminophen  1,000  mg Oral 4 times per day  . amiodarone  200 mg Oral BID  . amoxicillin-clavulanate  1 tablet Oral Q12H  . antiseptic oral rinse  7 mL Mouth Rinse BID  . aspirin EC  325 mg Oral Daily   Or  . aspirin  324 mg Per Tube Daily  . atorvastatin  20 mg Oral q1800  . bisacodyl  10 mg Oral Daily   Or  . bisacodyl  10 mg Rectal Daily  . docusate sodium  200 mg Oral Daily  . feeding supplement  237 mL Oral TID WC  . feeding supplement (ENSURE ENLIVE)  237 mL Oral TID WC  . furosemide  40 mg Oral Daily  . insulin aspart  0-24 Units Subcutaneous TID AC & HS  . insulin regular  0-10 Units Intravenous TID WC  . metoprolol tartrate  12.5 mg Oral BID  . mirtazapine  15 mg Oral QHS  . pantoprazole  40 mg Oral Daily  . potassium chloride  20 mEq Oral Daily  . sodium chloride flush  3 mL Intravenous Q12H  . sodium chloride flush  3 mL Intravenous Q12H   Continuous Infusions: . sodium chloride Stopped (10/14/15 1100)   PRN Meds:.sodium chloride, sodium chloride, guaiFENesin, levalbuterol, magnesium hydroxide, metoprolol, ondansetron (ZOFRAN) IV, oxyCODONE, sodium chloride flush, sodium chloride flush, traMADol  Xrays Dg Chest 2  View  10/17/2015  CLINICAL DATA:  Sore chest.  CABG. EXAM: CHEST  2 VIEW COMPARISON:  10/16/2015. FINDINGS: Mediastinum hilar structures normal. Prior CABG. Stable cardiomegaly. No pulmonary venous congestion . Stable right base atelectasis. Stable small right pleural effusion. No pneumothorax. Right upper quadrant drainage catheter in stable position. IMPRESSION: 1. Prior CABG.  Stable cardiomegaly. 2. Stable right base atelectasis and small right pleural effusion. No pneumothorax. 3. Right upper quadrant drainage catheter stable position. Electronically Signed   By: Marcello Moores  Register   On: 10/17/2015 07:38   Dg Chest Port 1 View  10/16/2015  CLINICAL DATA:  CABG. EXAM: PORTABLE CHEST 1 VIEW COMPARISON:  10/15/2015. FINDINGS: Interim removal of left chest tube. Right IJ sheath in  stable position. Right upper quadrant drainage catheter stable position . Mediastinum and hilar structures are normal. Prior CABG. Cardiomegaly with normal pulmonary vascularity. Right lower lobe atelectasis and/or infiltrate with small right pleural effusion. Small left pleural effusion. IMPRESSION: 1. Interim removal of left chest tube. No pneumothorax. Right upper quadrant drainage catheter in stable position . 2. Progressive right lower lobe atelectasis and/or infiltrate. Small bilateral pleural effusions. No pneumothorax. Electronically Signed   By: Marcello Moores  Register   On: 10/16/2015 07:33    Assessment/Plan: S/P Procedure(s) (LRB): CORONARY ARTERY BYPASS GRAFTING (CABG), ON PUMP, TIMES THREE, USING LEFT INTERNAL MAMMARY ARTERY, RIGHT GREATER SAPHENOUS VEIN HARVESTED ENDOSCOPICALLY (N/A) TRANSESOPHAGEAL ECHOCARDIOGRAM (TEE) (N/A)   1 good recovery from Cabg 2 some intermit PAF, cont amio, increase beta blocker, replace K+ 3 LFT's normal 4 H/H improved, platelets are elevated, leukocytosis improved 5 275 out of drain yesterday- continue 6 po augmentin 7 rehab going well, cont pulm toilet  LOS: 8 days    GOLD,WAYNE E 10/17/2015  patient examined and medical record reviewed,agree with above note. Tharon Aquas Trigt III 10/17/2015

## 2015-10-17 NOTE — Progress Notes (Signed)
CARDIAC REHAB PHASE I   PRE:  Rate/Rhythm: 109 ST  BP:  Supine:   Sitting: 161/86  Standing:    SaO2: 95%RA  MODE:  Ambulation: 550 ft   POST:  Rate/Rhythm: 130 ST  BP:  Supine:   Sitting: 182/92  Standing:    SaO2: 95%RA 1035-1105 Pt assisted to bathroom and then he walked 550 ft on RA with rolling walker and minimal asst. Tolerated well except for BP elevated and heart rate to 130. Stayed in NSR. To recliner with call bell.   Graylon Good, RN BSN  10/17/2015 11:03 AM

## 2015-10-18 LAB — GLUCOSE, CAPILLARY
GLUCOSE-CAPILLARY: 86 mg/dL (ref 65–99)
Glucose-Capillary: 113 mg/dL — ABNORMAL HIGH (ref 65–99)
Glucose-Capillary: 84 mg/dL (ref 65–99)
Glucose-Capillary: 102 mg/dL — ABNORMAL HIGH (ref 65–99)

## 2015-10-18 LAB — COMPREHENSIVE METABOLIC PANEL
ALT: 29 U/L (ref 17–63)
AST: 36 U/L (ref 15–41)
Albumin: 2.3 g/dL — ABNORMAL LOW (ref 3.5–5.0)
Alkaline Phosphatase: 64 U/L (ref 38–126)
Anion gap: 8 (ref 5–15)
BUN: 11 mg/dL (ref 6–20)
CO2: 23 mmol/L (ref 22–32)
Calcium: 8.5 mg/dL — ABNORMAL LOW (ref 8.9–10.3)
Chloride: 105 mmol/L (ref 101–111)
Creatinine, Ser: 0.91 mg/dL (ref 0.61–1.24)
GFR calc Af Amer: >60 mL/min (ref >60)
GFR calc non Af Amer: >60 mL/min (ref >60)
Glucose, Bld: 148 mg/dL — ABNORMAL HIGH (ref 65–99)
Potassium: 3.8 mmol/L (ref 3.5–5.1)
Sodium: 136 mmol/L (ref 135–145)
Total Bilirubin: 1 mg/dL (ref 0.3–1.2)
Total Protein: 6.6 g/dL (ref 6.5–8.1)

## 2015-10-18 MED ORDER — METOPROLOL TARTRATE 25 MG PO TABS
37.5000 mg | ORAL_TABLET | Freq: Two times a day (BID) | ORAL | Status: DC
  Administered 2015-10-18 (×2): 37.5 mg via ORAL
  Filled 2015-10-18 (×2): qty 1

## 2015-10-18 MED ORDER — LISINOPRIL 5 MG PO TABS
5.0000 mg | ORAL_TABLET | Freq: Every day | ORAL | Status: DC
  Administered 2015-10-18: 5 mg via ORAL
  Filled 2015-10-18: qty 1

## 2015-10-18 NOTE — Progress Notes (Addendum)
[address]       [place] [address]             [phone]      5 Days Post-Op Procedure(s) (LRB): CORONARY ARTERY BYPASS GRAFTING (CABG), ON PUMP, TIMES THREE, USING LEFT INTERNAL MAMMARY ARTERY, RIGHT GREATER SAPHENOUS VEIN HARVESTED ENDOSCOPICALLY (N/A) TRANSESOPHAGEAL ECHOCARDIOGRAM (TEE) (N/A) Subjective: conts to feel pretty well  Objective: Vital signs in last 24 hours: Temp:  [98.3 F (36.8 C)-98.7 F (37.1 C)] 98.3 F (36.8 C) (02/22 0454) Pulse Rate:  [105-113] 105 (02/22 0454) Cardiac Rhythm:  [-] Sinus tachycardia (02/21 2107) Resp:  [18] 18 (02/22 0454) BP: (128-158)/(67-80) 134/74 mmHg (02/22 0454) SpO2:  [93 %-94 %] 94 % (02/22 0454) Weight:  [162 lb 9.6 oz (73.755 kg)] 162 lb 9.6 oz (73.755 kg) (02/22 0500)  Hemodynamic parameters for last 24 hours:    Intake/Output from previous day: 02/21 0701 - 02/22 0700 In: 720 [P.O.:720] Out: 3050 [Urine:2750; Drains:300] Intake/Output this shift:    General appearance: alert, cooperative and no distress Heart: regular rate and rhythm and tachy Lungs: dim in right base Abdomen: benign Extremities: no edema Wound: incis healing well  Lab Results:  Recent Labs  10/16/15 0405 10/17/15 0347  WBC 16.5* 11.9*  HGB 10.5* 11.1*  HCT 31.8* 33.6*  PLT 446* 586*   BMET:  Recent Labs  10/17/15 0347 10/18/15 0247  NA 136 136  K 3.7 3.8  CL 102 105  CO2 23 23  GLUCOSE 98 148*  BUN 12 11  CREATININE 0.79 0.91  CALCIUM 8.3* 8.5*    PT/INR: No results for input(s): LABPROT, INR in the last 72 hours. ABG    Component Value Date/Time   PHART 7.471* 10/13/2015 1923   HCO3 18.7* 10/13/2015 1923   TCO2 22 10/14/2015 1631   ACIDBASEDEF 4.0* 10/13/2015 1923   O2SAT 90.0 10/13/2015 1923   CBG (last 3)   Recent Labs  10/17/15 1648 10/17/15 2139 10/18/15 0627  GLUCAP 88 109* 113*    Meds Scheduled Meds: . acetaminophen  1,000 mg Oral 4 times per day  . amiodarone  200 mg  Oral BID  . amoxicillin-clavulanate  1 tablet Oral Q12H  . antiseptic oral rinse  7 mL Mouth Rinse BID  . aspirin EC  325 mg Oral Daily   Or  . aspirin  324 mg Per Tube Daily  . atorvastatin  20 mg Oral q1800  . bisacodyl  10 mg Oral Daily   Or  . bisacodyl  10 mg Rectal Daily  . docusate sodium  200 mg Oral Daily  . feeding supplement  237 mL Oral TID WC  . feeding supplement (ENSURE ENLIVE)  237 mL Oral TID WC  . furosemide  40 mg Oral Daily  . insulin aspart  0-24 Units Subcutaneous TID AC & HS  . insulin regular  0-10 Units Intravenous TID WC  . metoprolol tartrate  25 mg Oral BID  . mirtazapine  15 mg Oral QHS  . pantoprazole  40 mg Oral Daily  . potassium chloride  20 mEq Oral Daily  . sodium chloride flush  3 mL Intravenous Q12H  . sodium chloride flush  3 mL Intravenous Q12H   Continuous Infusions: . sodium chloride Stopped (10/14/15 1100)   PRN Meds:.sodium chloride, sodium chloride, guaiFENesin, levalbuterol, magnesium hydroxide, metoprolol, ondansetron (ZOFRAN) IV, oxyCODONE, sodium chloride flush, sodium chloride flush, traMADol  Xrays Dg Chest 2 View  10/17/2015  CLINICAL DATA:  Sore chest.  CABG. EXAM: CHEST  2 VIEW COMPARISON:  10/16/2015. FINDINGS: Mediastinum hilar structures normal. Prior CABG. Stable cardiomegaly. No pulmonary venous congestion . Stable right base atelectasis. Stable small right pleural effusion. No pneumothorax. Right upper quadrant drainage catheter in stable position. IMPRESSION: 1. Prior CABG.  Stable cardiomegaly. 2. Stable right base atelectasis and small right pleural effusion. No pneumothorax. 3. Right upper quadrant drainage catheter stable position. Electronically Signed   By: Marcello Moores  Register   On: 10/17/2015 07:38    Assessment/Plan: S/P Procedure(s) (LRB): CORONARY ARTERY BYPASS GRAFTING (CABG), ON PUMP, TIMES THREE, USING LEFT INTERNAL MAMMARY ARTERY, RIGHT GREATER SAPHENOUS VEIN HARVESTED ENDOSCOPICALLY (N/A) TRANSESOPHAGEAL  ECHOCARDIOGRAM (TEE) (N/A)  1 conts with steady progress 2 sinus tachy with pac's, BP should tol a little higher beta blocker dose, will also add low dose ace 3 275 cc from drain 4 I think we can d/c lasix at this point 5 d/c wires 6 prob d/c in next 24-48 hours   LOS: 9 days    GOLD,WAYNE E 10/18/2015  Stop lasix Increase beta blocker   may  transfer to tele bed

## 2015-10-18 NOTE — Discharge Instructions (Signed)
Cholecystostomy, Care After Refer to this sheet in the next few weeks. These instructions provide you with information about caring for yourself after your procedure. Your health care provider may also give you more specific instructions. Your treatment has been planned according to current medical practices, but problems sometimes occur. Call your health care provider if you have any problems or questions after your procedure. WHAT TO EXPECT AFTER THE PROCEDURE After your procedure, it is common to have soreness near the site of your drainage tube (catheter) or your incision. HOME CARE INSTRUCTIONS Incision Care  Follow instructions from your health care provider about how to take care of your incision. Make sure you:  Wash your hands with soap and water before you change your bandage (dressing). If soap and water are not available, use hand sanitizer.  Change your dressing as told by your health care provider.  Leave stitches (sutures), skin glue, or adhesive strips in place. These skin closures may need to be in place for 2 weeks or longer. If adhesive strip edges start to loosen and curl up, you may trim the loose edges. Do not remove adhesive strips completely unless your health care provider tells you to do that.  Check your incision and your drainage site every day for signs of infection. Watch for:  Redness, swelling, or pain.  Fluid, blood, or pus. General Instructions  If you were sent home with a surgical drain in place, follow instructions from your health care provider about how to care for your drain and collection bag at home.  Do not take baths, swim, or use a hot tub until your health care provider approves. Ask your health care provider if you can take showers. You may only be allowed to take sponge baths for bathing.  Follow instructions from your health care provider about what you may eat or drink.  Take over-the-counter and prescription medicines only as told by  your health care provider.  Keep all follow-up visits as told by your health care provider. This is important. SEEK MEDICAL CARE IF:  You have redness, swelling, or pain at your incision or drainage site.  You have nausea or vomiting. SEEK IMMEDIATE MEDICAL CARE IF:  Your abdominal pain gets worse.  You feel dizzy or you faint while standing.  You have fluid, blood, or pus coming from your incision or drainage site.  You have a fever.  You have shortness of breath.  You have a rapid heartbeat.  Your nausea or vomiting does not go away.  Your drainage tube becomes blocked.  Your drainage tube comes out of your abdomen.   This information is not intended to replace advice given to you by your health care provider. Make sure you discuss any questions you have with your health care provider.   Document Released: 05/03/2015 Document Reviewed: 11/23/2014 Elsevier Interactive Patient Education 2016 [place]. Endoscopic Saphenous Vein Harvesting, Care After Refer to this sheet in the next few weeks. These instructions provide you with information on caring for yourself after your procedure. Your health care provider may also give you more specific instructions. Your treatment has been planned according to current medical practices, but problems sometimes occur. Call your health care provider if you have any problems or questions after your procedure. HOME CARE INSTRUCTIONS Medicine  Take whatever pain medicine your surgeon prescribes. Follow the directions carefully. Do not take over-the-counter pain medicine unless your surgeon says it is okay. Some pain medicine can cause bleeding problems for several weeks  after surgery.  Follow your surgeon's instructions about driving. You will probably not be permitted to drive after heart surgery.  Take any medicines your surgeon prescribes. Any medicines you took before your heart surgery should be checked with your health care provider  before you start taking them again. Wound care  If your surgeon has prescribed an elastic bandage or stocking, ask how long you should wear it.  Check the area around your surgical cuts (incisions) whenever your bandages (dressings) are changed. Look for any redness or swelling.  You will need to return to have the stitches (sutures) or staples taken out. Ask your surgeon when to do that.  Ask your surgeon when you can shower or bathe. Activity  Try to keep your legs raised when you are sitting.  Do any exercises your health care providers have given you. These may include deep breathing exercises, coughing, walking, or other exercises. SEEK MEDICAL CARE IF:  You have any questions about your medicines.  You have more leg pain, especially if your pain medicine stops working.  New or growing bruises develop on your leg.  Your leg swells, feels tight, or becomes red.  You have numbness in your leg. SEEK IMMEDIATE MEDICAL CARE IF:  Your pain gets much worse.  Blood or fluid leaks from any of the incisions.  Your incisions become warm, swollen, or red.  You have chest pain.  You have trouble breathing.  You have a fever.  You have more pain near your leg incision. MAKE SURE YOU:  Understand these instructions.  Will watch your condition.  Will get help right away if you are not doing well or get worse.   This information is not intended to replace advice given to you by your health care provider. Make sure you discuss any questions you have with your health care provider.   Document Released: 04/24/2011 Document Revised: 09/02/2014 Document Reviewed: 04/24/2011 Elsevier Interactive Patient Education 2016 [employer] Coronary Artery Bypass Grafting, Care After These instructions give you information on caring for yourself after your procedure. Your doctor may also give you more specific instructions. Call your doctor if you have any problems or questions after your  procedure.  HOME CARE  Only take medicine as told by your doctor. Take medicines exactly as told. Do not stop taking medicines or start any new medicines without talking to your doctor first.  Take your pulse as told by your doctor.  Do deep breathing as told by your doctor. Use your breathing device (incentive spirometer), if given, to practice deep breathing several times a day. Support your chest with a pillow or your arms when you take deep breaths or cough.  Keep the area clean, dry, and protected where the surgery cuts (incisions) were made. Remove bandages (dressings) only as told by your doctor. If strips were applied to surgical area, do not take them off. They fall off on their own.  Check the surgery area daily for puffiness (swelling), redness, or leaking fluid.  If surgery cuts were made in your legs:  Avoid crossing your legs.  Avoid sitting for long periods of time. Change positions every 30 minutes.  Raise your legs when you are sitting. Place them on pillows.  Wear stockings that help keep blood clots from forming in your legs (compression stockings).  Only take sponge baths until your doctor says it is okay to take showers. Pat the surgery area dry. Do not rub the surgery area with a washcloth or towel.  Do not bathe, swim, or use a hot tub until your doctor says it is okay.  Eat foods that are high in fiber. These include raw fruits and vegetables, whole grains, beans, and nuts. Choose lean meats. Avoid canned, processed, and fried foods.  Drink enough fluids to keep your pee (urine) clear or pale yellow.  Weigh yourself every day.  Rest and limit activity as told by your doctor. You may be told to:  Stop any activity if you have chest pain, shortness of breath, changes in heartbeat, or dizziness. Get help right away if this happens.  Move around often for short amounts of time or take short walks as told by your doctor. Gradually become more active. You may need  help to strengthen your muscles and build endurance.  Avoid lifting, pushing, or pulling anything heavier than 10 pounds (4.5 kg) for at least 6 weeks after surgery.  Do not drive until your doctor says it is okay.  Ask your doctor when you can go back to work.  Ask your doctor when you can begin sexual activity again.  Follow up with your doctor as told. GET HELP IF:  You have puffiness, redness, more pain, or fluid draining from the incision site.  You have a fever.  You have puffiness in your ankles or legs.  You have pain in your legs.  You gain 2 or more pounds (0.9 kg) a day.  You feel sick to your stomach (nauseous) or throw up (vomit).  You have watery poop (diarrhea). GET HELP RIGHT AWAY IF:  You have chest pain that goes to your jaw or arms.  You have shortness of breath.  You have a fast or irregular heartbeat.  You notice a "clicking" in your breastbone when you move.  You have numbness or weakness in your arms or legs.  You feel dizzy or light-headed. MAKE SURE YOU:  Understand these instructions.  Will watch your condition.  Will get help right away if you are not doing well or get worse.   This information is not intended to replace advice given to you by your health care provider. Make sure you discuss any questions you have with your health care provider.   Document Released: 08/17/2013 Document Reviewed: 08/17/2013 Elsevier Interactive Patient Education [employer].

## 2015-10-18 NOTE — Care Management Note (Signed)
Case Management Note Marvetta Gibbons RN, BSN Unit 2W-Case Manager [phone]  Patient Details  Name: Preston Bell MRN: [id number] Date of Birth: 1941/04/06  Subjective/Objective:    Pt admitted to [facility]- tx to [facility] on 2/13- post cath demonstrating LM CAD with severe RCA disease. He was admitted with Acute Cholecystitis/pancreatitis - Perc Tube. Severe sepsis - Klebsiella bacteremia. Cath for + Troponin. Now CXR suggestive of RLL PNA-- work up being done for CABG                 Action/Plan: PTA pt lived at home with daughter, daughter will provide 24 hour supervision post discharge - CM to follow for d/c needs as pt progresses   Expected Discharge Date:                  Expected Discharge Plan:  [place]  In-House Referral:     Discharge planning Services  CM Consult  Post Acute Care Choice:  Home Health Choice offered to:  Patient  DME Arranged:    DME Agency:     HH Arranged:  RN [place] Agency:  [facility]  Status of Service:  Completed, signed off  Medicare Important Message Given:  Yes Date Medicare IM Given:    Medicare IM give by:    Date Additional Medicare IM Given:    Additional Medicare Important Message give by:     If discussed at [place] of Stay Meetings, dates discussed:     Discharge Disposition: Home/home health   Additional Comments:  10/18/15- 1345- Marvetta Gibbons RN, BSN- orders placed for [facility] to assist with gallbladder drain management , restorative care after CABG-- spoke with pt at bedside - choice offered for [facility] agency in [facility]- per pt he does not have a preference for agency- ok with using [facility] for services- referral called to Lelan Pons with [facility]- for [place]-- per pt he does not need any DME- possible d/c in am.      Dawayne Patricia, RN 10/18/2015, 1:53 PM

## 2015-10-18 NOTE — Progress Notes (Signed)
Pacing wires removed. Pt tolerated well. One hr bedrest completed at 1145 am. V/S stable. Will continue to monitor.  Fritz Pickerel, RN

## 2015-10-18 NOTE — Discharge Summary (Signed)
Physician Discharge Summary  Patient ID: Preston Bell MRN: [id number] DOB/AGE: [birth year] 75 y.o.  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                                         Acute cholecystitis/pancreatitis with associated Klebsiella bacteremia Discharge Diagnoses:  Principal Problem:   Cholecystitis Active Problems:   Sepsis ([place])   NSTEMI (non-ST elevated myocardial infarction) ([place])   Vomiting and diarrhea   Left main coronary artery disease: 90% LM   Right lower lobe pneumonia   Bacteremia due to Klebsiella pneumoniae   CAD, multiple vessel; 90% LM, 90% ostLAD, 90% OstD2, 80% ostCx, 95% mRCA   Pleural effusion on right - s/p thoracentesis   Acute diastolic heart failure ([facility]) - EDP severely elevated @ Cath post NSTEMI   Essential hypertension   Hyperlipidemia   Anemia, unspecified   SVT (supraventricular tachycardia) (HCC)   Sinus tachycardia (H[facility])   Coronary artery disease involving native coronary artery of native heart without angina pectoris   Pancreatitis due to common bile duct stone   S/P CABG x 3  Patient Active Problem List   Diagnosis Date Noted  . 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 Essential hypertension 10/12/2015  . Hyperlipidemia 10/12/2015  . Anemia, unspecified 10/12/2015  . SVT (supraventricular tachycardia) ([place]) 10/12/2015  . Sinus tachycardia ([place]) 10/12/2015  . Coronary artery disease involving native coronary artery of native heart without angina pectoris   . Pancreatitis due to common bile duct stone   . Acute diastolic heart failure ([place]) - EDP severely elevated @ Cath post NSTEMI 10/11/2015  . Left main coronary artery disease: 90% LM 10/10/2015  . Right lower lobe pneumonia 10/10/2015  . Bacteremia due to Klebsiella pneumoniae 10/10/2015  . 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 Pleural effusion on right - s/p  thoracentesis   . Vomiting and diarrhea   . Elevated troponin I level 10/06/2015  . NSTEMI (non-ST elevated myocardial infarction) ([place])   . Cholecystitis 10/05/2015  . Sepsis ([place]) 10/05/2015  . Generalized abdominal pain    History of present illness:  Patient is a 75 year old male who presented to [facility] with acute cholecystitis. Principal symptoms included nausea, vomiting, diarrhea, abdominal pain, fever and tachycardia. He was seen by general surgery and the diagnosis of cholecystitis was confirmed. He however was noted to in addition to this have an elevated  troponin with a peak of 19. EKG showed sinus tachycardia with minimal ST depression in the lateral leads. Due to findings consistent with non-ST segment elevation myocardial infarction and cardiology consultation was obtained. Lactic acid was noted to be 8.9 and he had a significant leukocytosis of 23.1. It was determined he would require further cardiology evaluation prior to proceeding with cholecystectomy and interventional radiology was consulted to place a percutaneous cholecystostomy tube. Cardiac catheterization was done on 10/09/2015 and he was found to have severe left main and right coronary artery disease. CABG was recommended and he was subsequently transferred to [facility].   Discharged Condition: good  Hospital Course: The patient was transferred to [facility] and seen in cardiothoracic surgical consultation. He was felt to be a candidate for CABG. He continued to be medically stabilized including a intravenous antibiotics preoperatively and  the procedure was scheduled. He was seen by general surgery at [facility] also in consultation and not felt to require any further intervention regarding the gallbladder prior to proceeding with CABG. The procedure was then undertaken on 10/13/2015 and is described below. The patient is overall progressed nicely during the postoperative period. He  was continued on intravenous Zosyn for a short-term and then subsequently changed to by mouth Augmentin. He was weaned from the ventilator initially without difficulty. He has remained hemodynamically stable and all routine lines and drains have been discontinued in the standard fashion. Incisions are noted to be healing well without evidence of infection. He is tolerating gradually increasing activities using standard protocols. Oxygen has been weaned and he maintains good saturations on room air. His mild postoperative volume overload which has responded well to diuretics. The gallbladder drain continues to function without problem. He did have some postoperative paroxysmal atrial fibrillation but has been controlled and chemically cardioverted with beta blocker and amiodarone. He has expected acute blood loss anemia which is stable. Overall he is felt to be doing quite well and is tentatively scheduled for discharge in the next 24-48 hours pending ongoing reevaluation of his recovery.  Consults: general surgery  Significant Diagnostic Studies: labs: routine post op labs and serial CXR and angiography: cardiac cath  Results for orders placed or performed during the hospital encounter of 10/09/15  Surgical pcr screen     Status: None   Collection Time: 10/09/15  1:39 PM  Result Value Ref Range Status   MRSA, PCR NEGATIVE NEGATIVE Final   Staphylococcus aureus NEGATIVE NEGATIVE Final    Comment:        The Xpert SA Assay (FDA approved for NASAL specimens in patients over 27 years of age), is one component of a comprehensive surveillance program.  Test performance has been validated by [facility] for patients greater than or equal to 67 year old. It is not intended to diagnose infection nor to guide or monitor treatment.   Culture, body fluid-bottle     Status: None   Collection Time: 10/09/15  1:39 PM  Result Value Ref Range Status   Specimen Description FLUID BILE  Final   Special  Requests BOTTLES DRAWN AEROBIC AND ANAEROBIC 10MLS  Final   Culture NO GROWTH 5 DAYS  Final   Report Status 10/14/2015 FINAL  Final  Gram stain     Status: None   Collection Time: 10/09/15  1:39 PM  Result Value Ref Range Status   Specimen Description FLUID BILE  Final   Special Requests NONE  Final   Gram Stain   Final    RARE WBC PRESENT, PREDOMINANTLY PMN NO ORGANISMS SEEN    Report Status 10/09/2015 FINAL  Final  Culture, body fluid-bottle     Status: None   Collection Time: 10/10/15  3:50 PM  Result Value Ref Range Status   Specimen Description FLUID PLEURAL RIGHT  Final   Special Requests BOTTLES DRAWN AEROBIC AND ANAEROBIC 10CC  Final   Culture NO GROWTH 5 DAYS  Final   Report Status 10/15/2015 FINAL  Final  Gram stain     Status: None   Collection Time: 10/10/15  3:50 PM  Result Value Ref Range Status   Specimen Description FLUID PLEURAL RIGHT  Final   Special Requests NONE  Final   Gram Stain   Final    CYTOSPIN SMEAR WBC PRESENT,BOTH PMN AND MONONUCLEAR NO ORGANISMS SEEN    Report Status 10/11/2015 FINAL  Final  MRSA PCR Screening     Status: None   Collection Time: 10/11/15 12:53 AM  Result Value Ref Range Status   MRSA by PCR NEGATIVE NEGATIVE Final    Comment:        The GeneXpert MRSA Assay (FDA approved for NASAL specimens only), is one component of a comprehensive MRSA colonization surveillance program. It is not intended to diagnose MRSA infection nor to guide or monitor treatment for MRSA infections.   Culture, expectorated sputum-assessment     Status: None   Collection Time: 10/11/15 12:53 AM  Result Value Ref Range Status   Specimen Description EXPECTORATED SPUTUM  Final   Special Requests NONE  Final   Sputum evaluation   Final    MICROSCOPIC FINDINGS SUGGEST THAT THIS SPECIMEN IS NOT REPRESENTATIVE OF LOWER RESPIRATORY SECRETIONS. PLEASE RECOLLECT. Gram Stain Report Called to,Read Back By and Verified With: R SHUMBER,RN @0629  10/11/15  MKELLY    Report Status 10/11/2015 FINAL  Final  Culture, expectorated sputum-assessment     Status: None   Collection Time: 10/11/15  6:15 PM  Result Value Ref Range Status   Specimen Description SPUTUM  Final   Special Requests NONE  Final   Sputum evaluation   Final    THIS SPECIMEN IS ACCEPTABLE. RESPIRATORY CULTURE REPORT TO FOLLOW.   Report Status 10/11/2015 FINAL  Final  Culture, respiratory (NON-Expectorated)     Status: None   Collection Time: 10/11/15  6:15 PM  Result Value Ref Range Status   Specimen Description SPUTUM  Final   Special Requests NONE  Final   Gram Stain   Final    RARE WBC PRESENT, PREDOMINANTLY PMN NO SQUAMOUS EPITHELIAL CELLS SEEN NO ORGANISMS SEEN THIS SPECIMEN IS ACCEPTABLE FOR SPUTUM CULTURE Performed at [facility]    Culture   Final    NORMAL OROPHARYNGEAL FLORA Performed at [facility]    Report Status 10/14/2015 FINAL  Final  Surgical pcr screen     Status: None   Collection Time: 10/12/15  8:34 PM  Result Value Ref Range Status   MRSA, PCR NEGATIVE NEGATIVE Final   Staphylococcus aureus NEGATIVE NEGATIVE Final    Comment:        The Xpert SA Assay (FDA approved for NASAL specimens in patients over 20 years of age), is one component of a comprehensive surveillance program.  Test performance has been validated by [facility] for patients greater than or equal to 54 year old. It is not intended to diagnose infection nor to guide or monitor treatment.       Treatments: surgery:  DATE OF PROCEDURE: 10/13/2015 DATE OF DISCHARGE:   OPERATIVE REPORT   OPERATION: 1. Coronary artery bypass grafting x3 (left internal mammary artery to  LAD, saphenous vein graft to circumflex marginal, saphenous vein  graft to right coronary artery). 2. Endoscopic harvest of right leg greater saphenous vein.  SURGEON: Ivin Poot, M.D.  ASSISTANT: John Giovanni,  P.A.-C.  ANESTHESIA: General by Dr. Lovette Cliche.  PREOPERATIVE DIAGNOSIS: Severe 3-vessel coronary artery disease, status post non-ST elevation myocardial infarction with severe left main stenosis.  POSTOPERATIVE DIAGNOSIS: Severe 3-vessel coronary artery disease, status post non-ST elevation myocardial infarction with severe left main stenosis.  Discharge Exam: Blood pressure 144/74, pulse 109, temperature 98.5 F (36.9 C), temperature source Oral, resp. rate 18, height 6\' 2"  (1.88 m), weight 161 lb 6.4 oz (73.211 kg), SpO2 94 %.  General appearance: alert, cooperative and no distress Heart: regular rate  and rhythm and tachy Lungs: mildly dim in right base Abdomen: soft, non-tender Extremities: no edema Wound: incis healing well  Disposition: 82-DC/txfr to short term hosp for IP care with planned acute care IP readmission     Medication List    STOP taking these medications        TOPROL XL 50 MG 24 hr tablet  Generic drug:  metoprolol succinate      TAKE these medications        amiodarone 200 MG tablet  Commonly known as:  PACERONE  Take 1 tablet (200 mg total) by mouth 2 (two) times daily.     amoxicillin-clavulanate 875-125 MG tablet  Commonly known as:  AUGMENTIN  Take 1 tablet by mouth every 12 (twelve) hours.     aspirin 325 MG EC tablet  Take 1 tablet (325 mg total) by mouth daily.     atorvastatin 20 MG tablet  Commonly known as:  LIPITOR  Take 1 tablet (20 mg total) by mouth every morning.     lisinopril 10 MG tablet  Commonly known as:  PRINIVIL,ZESTRIL  Take 1 tablet (10 mg total) by mouth every morning.     metoprolol 50 MG tablet  Commonly known as:  LOPRESSOR  Take 1 tablet (50 mg total) by mouth 2 (two) times daily.     omeprazole 20 MG capsule  Commonly known as:  PRILOSEC  Take 20 mg by mouth daily.     oxyCODONE 5 MG immediate release tablet  Commonly known as:  Oxy IR/ROXICODONE  Take 1-2 tablets (5-10 mg total) by mouth every 6  (six) hours as needed for severe pain.     REMERON 15 MG tablet  Generic drug:  mirtazapine  Take 15 mg by mouth at bedtime.       Follow-up Information    Follow up with Clayburn Pert, MD. Schedule an appointment as soon as possible for a visit in 2 weeks.   Specialty:  General Surgery   Why:  For post-hospital follow up regarding your gallbladder tube, and possible surgery to remove your gallbladder   Contact information:   [address] [place] [address] [phone]       Follow up with Len Childs, MD.   Specialty:  Cardiothoracic Surgery   Contact information:   [address] [place] [address] [phone]       Follow up with Leonie Man, MD.   Specialty:  Cardiology   Why:  2 week follow-up with cardiology   Contact information:   [place] [address] [phone]       Follow up with [facility].   Why:  HH-RN arranged- they will call you to arrange visits   Contact information:   [address] [place] [address] [phone]      The patient has been discharged on:   1.Beta Blocker:  Yes Blue.Reese   ]                              No   [   ]                              If No, reason:  2.Ace Inhibitor/ARB: Yes [ y  ]  No  [    ]                                     If No, reason:  3.Statin:   Yes Blue.Reese   ]                  No  [   ]                  If No, reason:  4.Ecasa:  Yes  [ y  ]                  No   [   ]                  If No, reason:  Signed: GOLD,WAYNE E 10/19/2015, 7:51 AM

## 2015-10-18 NOTE — Progress Notes (Signed)
CARDIAC REHAB PHASE I   PRE:  Rate/Rhythm: 104 ST  BP:  Supine: 157/79  Sitting:   Standing:    SaO2: 96%RA  MODE:  Ambulation: 690 ft   POST:  Rate/Rhythm: 123 ST  BP:  Supine:   Sitting: 157/89  Standing:    SaO2: 99%RA 1345-1410 Pt walked 690 ft on RA with rolling walker and asst x 1 with steady gait. Encouraged sternal precautions. Tolerated well. To recliner after walk.   Graylon Good, RN BSN  10/18/2015 2:06 PM

## 2015-10-19 LAB — GLUCOSE, CAPILLARY: Glucose-Capillary: 95 mg/dL (ref 65–99)

## 2015-10-19 MED ORDER — LISINOPRIL 10 MG PO TABS: 10.0000 mg | ORAL_TABLET | ORAL | Status: DC

## 2015-10-19 MED ORDER — METOPROLOL TARTRATE 50 MG PO TABS
50.0000 mg | ORAL_TABLET | Freq: Two times a day (BID) | ORAL | Status: DC
  Administered 2015-10-19: 50 mg via ORAL
  Filled 2015-10-19: qty 1

## 2015-10-19 MED ORDER — LISINOPRIL 10 MG PO TABS
10.0000 mg | ORAL_TABLET | Freq: Every day | ORAL | Status: DC
  Administered 2015-10-19: 10 mg via ORAL
  Filled 2015-10-19: qty 1

## 2015-10-19 MED ORDER — ASPIRIN 325 MG PO TBEC: 325.0000 mg | DELAYED_RELEASE_TABLET | Freq: Every day | ORAL | Status: DC

## 2015-10-19 MED ORDER — AMOXICILLIN-POT CLAVULANATE 875-125 MG PO TABS: 1.0000 | ORAL_TABLET | Freq: Two times a day (BID) | ORAL | Status: DC

## 2015-10-19 MED ORDER — ATORVASTATIN CALCIUM 20 MG PO TABS: 20.0000 mg | ORAL_TABLET | ORAL | Status: DC

## 2015-10-19 MED ORDER — AMIODARONE HCL 200 MG PO TABS: 200.0000 mg | ORAL_TABLET | Freq: Two times a day (BID) | ORAL | Status: DC

## 2015-10-19 MED ORDER — OXYCODONE HCL 5 MG PO TABS: 5.0000 mg | ORAL_TABLET | Freq: Four times a day (QID) | ORAL | Status: DC | PRN

## 2015-10-19 MED ORDER — METOPROLOL TARTRATE 50 MG PO TABS: 50.0000 mg | ORAL_TABLET | Freq: Two times a day (BID) | ORAL | Status: DC

## 2015-10-19 NOTE — Progress Notes (Signed)
Discussed with the patient and all questioned fully answered. Discharge education included incision care, follow up appointments, and when to call the MD. Pt given paper prescriptions.   Pt gave return demonstration on how to empty biliary drain. Pt expresses confidence in emptying drain. Dressing changed prior to pt discharge. S[place]d RN to follow with dressing changes.   2 chest tube sutures left in place. Given verbal order by Dr. Prescott Gum to not remove sutures. Pt affirms that he will set up a follow up appointment with Dr. Prescott Gum.    Fritz Pickerel, RN

## 2015-10-19 NOTE — Progress Notes (Signed)
[id number] Education completed with pt and son who voiced understanding. Encouraged IS. Gave heart healthy diet. Discussed CRP 2 [place] but pt declined. Does not drive and son stated children work. Left brochure but will not refer. Ex ed given.. Pt loves to walk. Graylon Good RN BSN 10/19/2015 11:29 AM

## 2015-10-19 NOTE — Progress Notes (Addendum)
[address]       [employer] [address]             [phone]      6 Days Post-Op Procedure(s) (LRB): CORONARY ARTERY BYPASS GRAFTING (CABG), ON PUMP, TIMES THREE, USING LEFT INTERNAL MAMMARY ARTERY, RIGHT GREATER SAPHENOUS VEIN HARVESTED ENDOSCOPICALLY (N/A) TRANSESOPHAGEAL ECHOCARDIOGRAM (TEE) (N/A) Subjective: Feels well  Objective: Vital signs in last 24 hours: Temp:  [98.2 F (36.8 C)-98.6 F (37 C)] 98.5 F (36.9 C) (02/23 0631) Pulse Rate:  [97-109] 109 (02/23 0631) Cardiac Rhythm:  [-] Sinus tachycardia (02/22 1930) Resp:  [18] 18 (02/23 0631) BP: (116-164)/(65-86) 144/74 mmHg (02/23 0631) SpO2:  [94 %-96 %] 94 % (02/23 0631) Weight:  [161 lb 6.4 oz (73.211 kg)] 161 lb 6.4 oz (73.211 kg) (02/23 0631)  Hemodynamic parameters for last 24 hours:    Intake/Output from previous day: 02/22 0701 - 02/23 0700 In: 960 [P.O.:960] Out: 2300 [Urine:1875; Drains:425] Intake/Output this shift:    General appearance: alert, cooperative and no distress Heart: regular rate and rhythm and tachy Lungs: mildly dim in right base Abdomen: soft, non-tender Extremities: no edema Wound: incis healing well  Lab Results:  Recent Labs  10/17/15 0347  WBC 11.9*  HGB 11.1*  HCT 33.6*  PLT 586*   BMET:  Recent Labs  10/17/15 0347 10/18/15 0247  NA 136 136  K 3.7 3.8  CL 102 105  CO2 23 23  GLUCOSE 98 148*  BUN 12 11  CREATININE 0.79 0.91  CALCIUM 8.3* 8.5*    PT/INR: No results for input(s): LABPROT, INR in the last 72 hours. ABG    Component Value Date/Time   PHART 7.471* 10/13/2015 1923   HCO3 18.7* 10/13/2015 1923   TCO2 22 10/14/2015 1631   ACIDBASEDEF 4.0* 10/13/2015 1923   O2SAT 90.0 10/13/2015 1923   CBG (last 3)   Recent Labs  10/18/15 1612 10/18/15 2102 10/19/15 0631  GLUCAP 102* 86 95    Meds Scheduled Meds: . amiodarone  200 mg Oral BID  . amoxicillin-clavulanate  1 tablet Oral Q12H  . antiseptic oral rinse  7 mL  Mouth Rinse BID  . aspirin EC  325 mg Oral Daily   Or  . aspirin  324 mg Per Tube Daily  . atorvastatin  20 mg Oral q1800  . bisacodyl  10 mg Oral Daily   Or  . bisacodyl  10 mg Rectal Daily  . docusate sodium  200 mg Oral Daily  . feeding supplement  237 mL Oral TID WC  . feeding supplement (ENSURE ENLIVE)  237 mL Oral TID WC  . insulin aspart  0-24 Units Subcutaneous TID AC & HS  . lisinopril  5 mg Oral Daily  . metoprolol tartrate  37.5 mg Oral BID  . mirtazapine  15 mg Oral QHS  . pantoprazole  40 mg Oral Daily  . sodium chloride flush  3 mL Intravenous Q12H  . sodium chloride flush  3 mL Intravenous Q12H   Continuous Infusions: . sodium chloride Stopped (10/14/15 1100)   PRN Meds:.sodium chloride, sodium chloride, guaiFENesin, levalbuterol, magnesium hydroxide, metoprolol, ondansetron (ZOFRAN) IV, oxyCODONE, sodium chloride flush, sodium chloride flush, traMADol  Xrays No results found.  Assessment/Plan: S/P Procedure(s) (LRB): CORONARY ARTERY BYPASS GRAFTING (CABG), ON PUMP, TIMES THREE, USING LEFT INTERNAL MAMMARY ARTERY, RIGHT GREATER SAPHENOUS VEIN HARVESTED ENDOSCOPICALLY (N/A) TRANSESOPHAGEAL ECHOCARDIOGRAM (TEE) (N/A)  1 HR remains elevated , will increase beta blocker to 50 BID  2 also room to increase ACE inhib 3 no fevers, abd exam non-acute, drain functioning well 4 will observe HR/BP a little while this morning but he appears very stable for discharge    LOS: 10 days    GOLD,WAYNE E 10/19/2015  patient examined and medical record reviewed,agree with above note. Tharon Aquas Trigt III 10/19/2015

## 2015-10-20 DIAGNOSIS — Z48812 Encounter for surgical aftercare following surgery on the circulatory system: Secondary | ICD-10-CM | POA: Diagnosis not present

## 2015-10-24 ENCOUNTER — Telehealth: Payer: Self-pay | Admitting: Surgery

## 2015-10-24 NOTE — Telephone Encounter (Signed)
Spoke with patients daughter to cancel appointment for gallstones. Patient had open heart surgery and the daughter, Bertram Millard, said they would call us in six months if he was still having trouble.

## 2015-10-27 ENCOUNTER — Other Ambulatory Visit: Payer: Self-pay

## 2015-10-27 DIAGNOSIS — G8918 Other acute postprocedural pain: Secondary | ICD-10-CM

## 2015-10-27 MED ORDER — OXYCODONE HCL 5 MG PO TABS: 5.0000 mg | ORAL_TABLET | Freq: Four times a day (QID) | ORAL | Status: DC | PRN

## 2015-10-27 NOTE — Telephone Encounter (Signed)
RX refill for oxycodone printed out, will pick up at front desk.

## 2015-10-31 ENCOUNTER — Emergency Department: Payer: Medicare Other

## 2015-10-31 ENCOUNTER — Emergency Department
Admission: EM | Admit: 2015-10-31 | Discharge: 2015-10-31 | Disposition: A | Discharge status: Home / Self Care | Payer: Medicare Other | Attending: Emergency Medicine | Admitting: Emergency Medicine

## 2015-10-31 ENCOUNTER — Ambulatory Visit (INDEPENDENT_AMBULATORY_CARE_PROVIDER_SITE_OTHER): Payer: Medicare Other | Admitting: Cardiovascular Disease

## 2015-10-31 ENCOUNTER — Encounter: Payer: Self-pay | Admitting: Cardiovascular Disease

## 2015-10-31 ENCOUNTER — Encounter: Payer: Medicare Other | Admitting: Nurse Practitioner

## 2015-10-31 VITALS — BP 120/72 | HR 120 | Ht 74.0 in | Wt 158.0 lb

## 2015-10-31 DIAGNOSIS — R531 Weakness: Secondary | ICD-10-CM | POA: Diagnosis not present

## 2015-10-31 DIAGNOSIS — Z9689 Presence of other specified functional implants: Secondary | ICD-10-CM | POA: Diagnosis not present

## 2015-10-31 DIAGNOSIS — G8929 Other chronic pain: Secondary | ICD-10-CM | POA: Insufficient documentation

## 2015-10-31 DIAGNOSIS — I214 Non-ST elevation (NSTEMI) myocardial infarction: Secondary | ICD-10-CM | POA: Diagnosis not present

## 2015-10-31 DIAGNOSIS — Z79899 Other long term (current) drug therapy: Secondary | ICD-10-CM | POA: Insufficient documentation

## 2015-10-31 DIAGNOSIS — R630 Anorexia: Secondary | ICD-10-CM | POA: Diagnosis not present

## 2015-10-31 DIAGNOSIS — R1010 Upper abdominal pain, unspecified: Secondary | ICD-10-CM | POA: Insufficient documentation

## 2015-10-31 DIAGNOSIS — R000 Tachycardia, unspecified: Secondary | ICD-10-CM | POA: Diagnosis not present

## 2015-10-31 DIAGNOSIS — I10 Essential (primary) hypertension: Secondary | ICD-10-CM

## 2015-10-31 DIAGNOSIS — R05 Cough: Secondary | ICD-10-CM | POA: Diagnosis not present

## 2015-10-31 DIAGNOSIS — I2510 Atherosclerotic heart disease of native coronary artery without angina pectoris: Secondary | ICD-10-CM

## 2015-10-31 DIAGNOSIS — Z7982 Long term (current) use of aspirin: Secondary | ICD-10-CM | POA: Insufficient documentation

## 2015-10-31 DIAGNOSIS — Z87891 Personal history of nicotine dependence: Secondary | ICD-10-CM | POA: Diagnosis not present

## 2015-10-31 LAB — COMPREHENSIVE METABOLIC PANEL
ALK PHOS: 107 U/L (ref 38–126)
ALT: 87 U/L — AB (ref 17–63)
AST: 59 U/L — AB (ref 15–41)
Albumin: 3.3 g/dL — ABNORMAL LOW (ref 3.5–5.0)
Anion gap: 13 (ref 5–15)
BILIRUBIN TOTAL: 0.3 mg/dL (ref 0.3–1.2)
BUN: 15 mg/dL (ref 6–20)
CHLORIDE: 99 mmol/L — AB (ref 101–111)
CO2: 22 mmol/L (ref 22–32)
Calcium: 9.4 mg/dL (ref 8.9–10.3)
Creatinine, Ser: 0.96 mg/dL (ref 0.61–1.24)
GFR calc Af Amer: >60 mL/min (ref >60)
GLUCOSE: 91 mg/dL (ref 65–99)
POTASSIUM: 4.4 mmol/L (ref 3.5–5.1)
Sodium: 134 mmol/L — ABNORMAL LOW (ref 135–145)
Total Protein: 8.7 g/dL — ABNORMAL HIGH (ref 6.5–8.1)

## 2015-10-31 LAB — CBC WITH DIFFERENTIAL/PLATELET
Basophils Absolute: 0.1 10*3/uL (ref 0–0.1)
Basophils Relative: 1 %
EOS PCT: 6 %
Eosinophils Absolute: 0.6 10*3/uL (ref 0–0.7)
HCT: 34.2 % — ABNORMAL LOW (ref 40.0–52.0)
Hemoglobin: 11.9 g/dL — ABNORMAL LOW (ref 13.0–18.0)
LYMPHS ABS: 1.5 10*3/uL (ref 1.0–3.6)
LYMPHS PCT: 15 %
MCH: 31.4 pg (ref 26.0–34.0)
MCHC: 34.8 g/dL (ref 32.0–36.0)
MCV: 90.2 fL (ref 80.0–100.0)
MONO ABS: 1.4 10*3/uL — AB (ref 0.2–1.0)
Monocytes Relative: 14 %
Neutro Abs: 6.3 10*3/uL (ref 1.4–6.5)
Neutrophils Relative %: 64 %
PLATELETS: 308 10*3/uL (ref 150–440)
RBC: 3.79 MIL/uL — AB (ref 4.40–5.90)
RDW: 14.9 % — ABNORMAL HIGH (ref 11.5–14.5)
WBC: 9.7 10*3/uL (ref 3.8–10.6)

## 2015-10-31 LAB — URINALYSIS COMPLETE WITH MICROSCOPIC (ARMC ONLY)
Bacteria, UA: NONE SEEN
Bilirubin Urine: NEGATIVE
Glucose, UA: NEGATIVE mg/dL
Hgb urine dipstick: NEGATIVE
KETONES UR: NEGATIVE mg/dL
Leukocytes, UA: NEGATIVE
Nitrite: NEGATIVE
PROTEIN: NEGATIVE mg/dL
SQUAMOUS EPITHELIAL / LPF: NONE SEEN
Specific Gravity, Urine: 1.004 — ABNORMAL LOW (ref 1.005–1.030)
pH: 7 (ref 5.0–8.0)

## 2015-10-31 LAB — LACTIC ACID, PLASMA: LACTIC ACID, VENOUS: 1.7 mmol/L (ref 0.5–2.0)

## 2015-10-31 LAB — TROPONIN I: TROPONIN I: 0.04 ng/mL — AB (ref <0.031)

## 2015-10-31 MED ORDER — AMIODARONE HCL 200 MG PO TABS
200.0000 mg | ORAL_TABLET | Freq: Every day | ORAL | Status: DC
  Administered 2015-10-31: 200 mg via ORAL
  Filled 2015-10-31: qty 1

## 2015-10-31 MED ORDER — MORPHINE SULFATE (PF) 4 MG/ML IV SOLN
4.0000 mg | Freq: Once | INTRAVENOUS | Status: AC
  Administered 2015-10-31: 4 mg via INTRAVENOUS
  Filled 2015-10-31: qty 1

## 2015-10-31 MED ORDER — SODIUM CHLORIDE 0.9 % IV BOLUS (SEPSIS)
1000.0000 mL | Freq: Once | INTRAVENOUS | Status: AC
  Administered 2015-10-31: 1000 mL via INTRAVENOUS

## 2015-10-31 MED ORDER — METOPROLOL TARTRATE 50 MG PO TABS
50.0000 mg | ORAL_TABLET | Freq: Once | ORAL | Status: AC
  Administered 2015-10-31: 50 mg via ORAL
  Filled 2015-10-31: qty 1

## 2015-10-31 MED ORDER — ONDANSETRON HCL 4 MG/2ML IJ SOLN
4.0000 mg | Freq: Once | INTRAMUSCULAR | Status: AC
  Administered 2015-10-31: 4 mg via INTRAVENOUS
  Filled 2015-10-31: qty 2

## 2015-10-31 NOTE — ED Provider Notes (Signed)
[facility] Emergency Department Provider Note   ____________________________________________  Time seen: Approximately 2:50 PM I have reviewed the triage vital signs and the triage nursing note.  HISTORY  Chief Complaint Weakness   Historian Patient and son  HPI Preston Bell is a 75 y.o. male with recent diagnosis ofof pneumonia cholecystitis with current biliary drain in place, recent CABG discharged from the hospital about one week ago at [facility]. He states he finished antibiotics at home for his cholecystitis. He's had a mild nonproductive cough. The last 2 days she's had generalized weakness and decreased by mouth appetite. Denies fever. Denies chest pain. Denies urinary symptoms.    Past Medical History  Diagnosis Date  . Cancer ([place])   . History of colectomy   . Hypertension   . GERD (gastroesophageal reflux disease)   . HLD (hyperlipidemia)   . NSTEMI (non-ST elevated myocardial infarction) ([place])   . 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 Acute diastolic heart failure (HCC) - EDP severely elevated @ Cath post NSTEMI 10/11/2015    Patient Active Problem List   Diagnosis Date Noted  . 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 Essential hypertension 10/12/2015  . Hyperlipidemia 10/12/2015  . Anemia, unspecified 10/12/2015  . SVT (supraventricular tachycardia) ([place]) 10/12/2015  . Sinus tachycardia ([place]) 10/12/2015  . Coronary artery disease involving native coronary artery of native heart without angina pectoris   . Pancreatitis due to common bile duct stone   . Acute diastolic heart failure ([place]) - EDP severely elevated @ Cath post NSTEMI 10/11/2015  . Left main coronary artery disease: 90% LM 10/10/2015  . Right lower lobe pneumonia 10/10/2015  . Bacteremia due to Klebsiella pneumoniae 10/10/2015  . 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 Pleural effusion on  right - s/p thoracentesis   . Vomiting and diarrhea   . Elevated troponin I level 10/06/2015  . NSTEMI (non-ST elevated myocardial infarction) ([place])   . Cholecystitis 10/05/2015  . Sepsis ([place]) 10/05/2015  . Generalized abdominal pain     Past Surgical History  Procedure Laterality Date  . Colostomy reversal    . Cardiac catheterization N/A 10/09/2015    Procedure: Left Heart Cath and Coronary Angiography;  Surgeon: Wellington Hampshire, MD;  Location: [place] [facility];  Service: Cardiovascular;  Laterality: N/A;  . Coronary artery bypass graft N/A 10/13/2015    Procedure: CORONARY ARTERY BYPASS GRAFTING (CABG), ON PUMP, TIMES THREE, USING LEFT INTERNAL MAMMARY ARTERY, RIGHT GREATER SAPHENOUS VEIN HARVESTED ENDOSCOPICALLY;  Surgeon: Ivin Poot, MD;  Location: [place];  Service: Open Heart Surgery;  Laterality: N/A;  LIMA-LAD; SVG-OM; SVG-RCA  . Tee without cardioversion N/A 10/13/2015    Procedure: TRANSESOPHAGEAL ECHOCARDIOGRAM (TEE);  Surgeon: Ivin Poot, MD;  Location: [place];  Service: Open Heart Surgery;  Laterality: N/A;    Current Outpatient Rx  Name  Route  Sig  Dispense  Refill  . amiodarone (PACERONE) 200 MG tablet   Oral   Take 1 tablet (200 mg total) by mouth 2 (two) times daily.   60 tablet   1   . aspirin EC 325 MG EC tablet   Oral   Take 1 tablet (325 mg total) by mouth daily.         Marland Kitchen atorvastatin (LIPITOR) 20 MG tablet   Oral   Take 1 tablet (20 mg total) by mouth  every morning.   30 tablet   1   . lisinopril (PRINIVIL,ZESTRIL) 10 MG tablet   Oral   Take 1 tablet (10 mg total) by mouth every morning.   30 tablet   1   . metoprolol (LOPRESSOR) 50 MG tablet   Oral   Take 1 tablet (50 mg total) by mouth 2 (two) times daily.   60 tablet   1   . mirtazapine (REMERON) 15 MG tablet   Oral   Take 15 mg by mouth at bedtime.         Marland Kitchen omeprazole (PRILOSEC) 20 MG capsule   Oral   Take 20 mg by mouth daily.           Allergies Review of  patient's allergies indicates no known allergies.  Family History  Problem Relation Age of Onset  . Cervical cancer Mother   . Lung cancer Father   . Lung cancer Brother   . Hypertension Brother   . Hypertension Son     Social History Social History  Substance Use Topics  . Smoking status: Former Smoker -- 1.00 packs/day for 30 years    Quit date: 10/06/1983  . Smokeless tobacco: Never Used  . Alcohol Use: No     Comment: Quit consuming alcohol in 1988.    Review of Systems  Constitutional: Negative for fever. Eyes: Negative for visual changes. ENT: Negative for sore throat. Cardiovascular: Negative for chest pain. Respiratory: Negative for shortness of breath. Gastrointestinal: He does have some chronic abdominal pain. Genitourinary: Negative for dysuria. Musculoskeletal: Negative for back pain. Skin: Negative for rash. Neurological: Negative for headache. 10 point Review of Systems otherwise negative ____________________________________________   PHYSICAL EXAM:  VITAL SIGNS: ED Triage Vitals  Enc Vitals Group     BP 10/31/15 1439 94/62 mmHg     Pulse Rate 10/31/15 1439 126     Resp 10/31/15 1439 18     Temp 10/31/15 1439 97.7 F (36.5 C)     Temp Source 10/31/15 1439 Oral     SpO2 10/31/15 1439 97 %     Weight 10/31/15 1439 158 lb (71.668 kg)     Height 10/31/15 1439 6\' 2"  (1.88 m)     Head Cir --      Peak Flow --      Pain Score 10/31/15 1439 8     Pain Loc --      Pain Edu? --      Excl. in [place]? --      Constitutional: Alert and oriented. Well appearing and in no distress. HEENT   Head: Normocephalic and atraumatic.      Eyes: Conjunctivae are normal. PERRL. Normal extraocular movements.      Ears:         Nose: No congestion/rhinnorhea.   Mouth/Throat: Mucous membranes are moderately dry.   Neck: No stridor. Cardiovascular/Chest: Tachycardic, regular..  No murmurs, rubs, or gallops. Respiratory: Normal respiratory effort without  tachypnea nor retractions. Breath sounds are clear and equal bilaterally. No wheezes/rales. Mild rhonchi posteriorly bases. Gastrointestinal: Soft. No distention, no guarding, no rebound. Mild upper abdominal tenderness palpation. Biliary drain in place.  Genitourinary/rectal:Deferred Musculoskeletal: Nontender with normal range of motion in all extremities. No joint effusions.  No lower extremity tenderness.  No edema. Neurologic:  Normal speech and language. No gross or focal neurologic deficits are appreciated. Skin:  Skin is warm, dry and intact. No rash noted. Psychiatric: Mood and affect are normal. Speech and behavior are normal.  Patient exhibits appropriate insight and judgment.  ____________________________________________   EKG I, Lisa Roca, MD, the attending physician have personally viewed and interpreted all ECGs.  124 bpm. Sinus tachycardia. Narrow QRS. Normal axis. Nonspecific ST and T-wave ____________________________________________  LABS (pertinent positives/negatives)  Comments metabolic panel significant for sodium 134, chloride 99, AST 59, a LT 87 White blood cell count 9.7 with no left shift. Hemoglobin 11.9, platelet count 308 minutes and lactate 1.7 2 blood cultures sent Urinalysis negative Troponin  0.04 ____________________________________________  RADIOLOGY All Xrays were viewed by me. Imaging interpreted by Radiologist.  Abdomen acute chest:   IMPRESSION: Negative abdominal radiographs. Pigtail catheter projecting over the right upper quadrant.  Small bilateral pleural effusions. __________________________________________  PROCEDURES  Procedure(s) performed: None  Critical Care performed: none   ____________________________________________   ED COURSE / ASSESSMENT AND PLAN  Pertinent labs & imaging results that were available during my care of the patient were reviewed by me and considered in my medical decision making (see chart for  details).   Patient arrived tachycardic and hypotensive, requiring immediate evaluation for the consideration of possible sepsis given recent history of cholecystitis with biliary drain in place. He is not febrile here. IV fluids will be started for possibly of dehydration. Labs initiated.  Patient in a moderate amount of pain, he is due for his pain medication, I will given morphine and Zofran here in the emergency department.  Recently had CABG, he reports some midsternal muscle/chest wall soreness, no change or different, in fact improving post surgery.  His laboratory evaluation is reassuring. I did obtain chest x-ray, abdominal x-ray, and ultrasound of the abdomen which are all reassuring.  Patient received some fluids and feels better. His heart rate is still 105-110, but patient states his heart rate is often elevated. He also now tells me that he missed his morning dose of metoprolol and amiodarone.  I suspect any acute illness, or infections. I'm not suspicious of PE.   He is okay for discharge home.    CONSULTATIONS:   None   Patient / Family / Caregiver informed of clinical course, medical decision-making process, and agree with plan.   I discussed return precautions, follow-up instructions, and discharged instructions with patient and/or family.   ___________________________________________   FINAL CLINICAL IMPRESSION(S) / ED DIAGNOSES   Final diagnoses:  Tachycardia  Generalized weakness              Note: This dictation was prepared with Dragon dictation. Any transcriptional errors that result from this process are unintentional   Lisa Roca, MD 10/31/15 2042

## 2015-10-31 NOTE — Discharge Instructions (Signed)
You were evaluated for generalized weakness, and your exam and evaluation are reassuring today in the emergency department. Your heart rate was a little elevated, however since she missed your morning and now evening doses of heart rate medications including amiodarone and metoprolol, I suspect this is why it was a little elevated.  Return to the emergency department for any worsening condition including fever, chest pain, weakness or numbness, confusion altered mental status, abdominal pain, or any other symptoms concerning to.   Weakness Weakness is a lack of strength. You may feel weak all over your body or just in one part of your body. Weakness can be serious. In some cases, you may need more medical tests. HOME Champ a well-balanced diet.  Try to exercise every day.  Only take medicines as told by your doctor. GET HELP RIGHT AWAY IF:   You cannot do your normal daily activities.  You cannot walk up and down stairs, or you feel very tired when you do so.  You have shortness of breath or chest pain.  You have trouble moving parts of your body.  You have weakness in only one body part or on only one side of the body.  You have a fever.  You have trouble speaking or swallowing.  You cannot control when you pee (urinate) or poop (bowel movement).  You have black or bloody throw up (vomit) or poop.  Your weakness gets worse or spreads to other body parts.  You have new aches or pains. MAKE SURE YOU:   Understand these instructions.  Will watch your condition.  Will get help right away if you are not doing well or get worse.   This information is not intended to replace advice given to you by your health care provider. Make sure you discuss any questions you have with your health care provider.   Document Released: 07/25/2008 Document Revised: 02/11/2012 Document Reviewed: 10/11/2011 Elsevier Interactive Patient Education [employer].

## 2015-10-31 NOTE — ED Notes (Signed)
Pt c/o weakness x 3 days.  Pt recently had D/C from hospital r/t gallbladder drain.  Pts site looks healthy, no infection noticed.  Pt denies n/v/d, SOB, CP or LOC.  Pt sts that he has not been eating as normal r/t lack of appetite.  NAD.

## 2015-10-31 NOTE — ED Notes (Signed)
Patient at Ultrasound

## 2015-10-31 NOTE — ED Notes (Signed)
Pt jsut had open hear surgery 3 weeks ago and has a drain tube for infected gallbladder for the past 3 weeks , states in the past week he has not been about to eat or drink much over this past week due to nausea/loss of appetite.. States he is extremely weak.Marland Kitchen

## 2015-10-31 NOTE — Patient Instructions (Signed)
Urgent evaluation in the ED.

## 2015-10-31 NOTE — Progress Notes (Signed)
Cardiology Office Note   Date:  10/31/2015   ID:  Preston Bell, DOB [birth year], MRN [id number]  PCP:  No PCP Per Patient  Cardiologist:   Kathlyn Sacramento, MD   Chief Complaint  Patient presents with  . other    Follow up from CABG x 3. Meds reviewed by the patient's med list [facility]. Pt. c/o not being able to sleep at night, loss of appetite, chest soreness and hallucinations.       History of Present Illness: Preston Bell is a 75 y.o. male who presents for a follow-up visit after recent non-ST elevation myocardial infarction status post CABG. He initially presented to [facility] with acute cholecystitis. He was found to have non-ST elevation myocardial infarction with a troponin of 20. He was stabilized medically and treated with antibiotics. He then underwent cardiac catheterization which showed severe left main and three-vessel coronary artery disease. He was transferred to [facility] where he underwent CABG by Dr. Lucianne Lei triet. The patient was discharged home and still has a percutaneous drainage for his gallbladder with plans for surgery in April. The patient reports that he felt well the first few days after he went home but then he started feeling poorly over the last week and particularly over the last few days with extremely poor appetite, dizziness, insomnia and hallucinations. Since his initial hospitalization, he lost 13 pounds. Over the last 3 days, he lost 3 pounds. He was noted to be tachycardic today with a heart rate of 120 bpm. He complains of chest pain at the surgical site.    Past Medical History  Diagnosis Date  . Cancer ([place])   . History of colectomy   . Hypertension   . GERD (gastroesophageal reflux disease)   . HLD (hyperlipidemia)   . NSTEMI (non-ST elevated myocardial infarction) ([place])   . 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 Acute diastolic heart failure (HCC) - EDP severely elevated @ Cath post NSTEMI 10/11/2015      Past Surgical History  Procedure Laterality Date  . Colostomy reversal    . Cardiac catheterization N/A 10/09/2015    Procedure: Left Heart Cath and Coronary Angiography;  Surgeon: Wellington Hampshire, MD;  Location: [place] [facility];  Service: Cardiovascular;  Laterality: N/A;  . Coronary artery bypass graft N/A 10/13/2015    Procedure: CORONARY ARTERY BYPASS GRAFTING (CABG), ON PUMP, TIMES THREE, USING LEFT INTERNAL MAMMARY ARTERY, RIGHT GREATER SAPHENOUS VEIN HARVESTED ENDOSCOPICALLY;  Surgeon: Ivin Poot, MD;  Location: [place];  Service: Open Heart Surgery;  Laterality: N/A;  LIMA-LAD; SVG-OM; SVG-RCA  . Tee without cardioversion N/A 10/13/2015    Procedure: TRANSESOPHAGEAL ECHOCARDIOGRAM (TEE);  Surgeon: Ivin Poot, MD;  Location: [place];  Service: Open Heart Surgery;  Laterality: N/A;     Current Outpatient Prescriptions  Medication Sig Dispense Refill  . amiodarone (PACERONE) 200 MG tablet Take 1 tablet (200 mg total) by mouth 2 (two) times daily. 60 tablet 1  . aspirin EC 325 MG EC tablet Take 1 tablet (325 mg total) by mouth daily.    Marland Kitchen atorvastatin (LIPITOR) 20 MG tablet Take 1 tablet (20 mg total) by mouth every morning. 30 tablet 1  . lisinopril (PRINIVIL,ZESTRIL) 10 MG tablet Take 1 tablet (10 mg total) by mouth every morning. 30 tablet 1  . metoprolol (LOPRESSOR) 50 MG tablet Take 1 tablet (50 mg total) by mouth 2 (two) times daily. 60 tablet  1  . mirtazapine (REMERON) 15 MG tablet Take 15 mg by mouth at bedtime.    Marland Kitchen omeprazole (PRILOSEC) 20 MG capsule Take 20 mg by mouth daily.     No current facility-administered medications for this visit.    Allergies:   Review of patient's allergies indicates no known allergies.    Social History:  The patient  reports that he quit smoking about 32 years ago. He has never used smokeless tobacco. He reports that he uses illicit drugs (Marijuana). He reports that he does not drink alcohol.   Family History:  The patient's  family history includes Cervical cancer in his mother; Hypertension in his brother and son; Lung cancer in his brother and father.    ROS:  Please see the history of present illness.   Otherwise, review of systems are positive for none.   All other systems are reviewed and negative.    PHYSICAL EXAM: VS:  BP 120/72 mmHg  Pulse 120  Ht 6\' 2"  (1.88 m)  Wt 158 lb (71.668 kg)  BMI 20.28 kg/m2 , BMI Body mass index is 20.28 kg/(m^2). GEN: Well nourished, well developed, in mild distress HEENT: normal Neck: no JVD, carotid bruits, or masses Cardiac: Tachycardic; no murmurs, rubs, or gallops,no edema  Respiratory:  clear to auscultation bilaterally, normal work of breathing GI: soft, nontender, nondistended, + BS MS: no deformity or atrophy Skin: warm and dry, no rash Neuro:  Strength and sensation are intact Psych: euthymic mood, full affect   EKG:  EKG is ordered today. The ekg ordered today demonstrates sinus tachycardia with minimal LVH criteria and nonspecific ST and T wave changes.   Recent Labs: 10/12/2015: TSH 9.901* 10/14/2015: Magnesium 2.4 10/17/2015: Hemoglobin 11.1*; Platelets 586* 10/18/2015: ALT 29; BUN 11; Creatinine, Ser 0.91; Potassium 3.8; Sodium 136    Lipid Panel    Component Value Date/Time   CHOL 84 10/13/2015 0258   TRIG 117 10/13/2015 0258   HDL 15* 10/13/2015 0258   CHOLHDL 5.6 10/13/2015 0258   VLDL 23 10/13/2015 0258   LDLCALC 46 10/13/2015 0258      Wt Readings from Last 3 Encounters:  10/31/15 158 lb (71.668 kg)  10/19/15 161 lb 6.4 oz (73.211 kg)  10/05/15 171 lb 15.3 oz (78 kg)         ASSESSMENT AND PLAN:  1.  Recent non-ST elevation myocardial infarction status post CABG for left main and three-vessel coronary artery disease: The patient does not seem to be doing too well over the last few days with poor appetite, tachycardia and possible volume depletion. He is having chest pain but it seems to be at the surgical site. His family  also reports that he's been having hallucinations over the last few days. I elected to send the patient to the emergency room for evaluation and the need for IV hydration. We might need to observe him overnight.  2. Recent acute cholecystitis: Plans for cholecystectomy in April. We will need to check his labs and make sure he is not having complications related to this. Amiodarone might be affecting his appetite and we might need to consider stopping this medication.        Disposition:   FU with me . in 1 month  Signed,  Kathlyn Sacramento, MD  10/31/2015 2:03 PM    [place]

## 2015-11-01 ENCOUNTER — Ambulatory Visit: Payer: Medicare Other | Admitting: Surgery

## 2015-11-05 LAB — CULTURE, BLOOD (ROUTINE X 2)
CULTURE: NO GROWTH
CULTURE: NO GROWTH

## 2015-11-07 ENCOUNTER — Other Ambulatory Visit: Payer: Self-pay | Admitting: *Deleted

## 2015-11-07 ENCOUNTER — Other Ambulatory Visit: Payer: Self-pay | Admitting: Cardiothoracic Surgery

## 2015-11-07 DIAGNOSIS — Z951 Presence of aortocoronary bypass graft: Secondary | ICD-10-CM

## 2015-11-07 DIAGNOSIS — G8918 Other acute postprocedural pain: Secondary | ICD-10-CM

## 2015-11-07 MED ORDER — OXYCODONE HCL 5 MG PO TABS: 5.0000 mg | ORAL_TABLET | ORAL | Status: DC | PRN

## 2015-11-07 NOTE — Telephone Encounter (Signed)
Mrs. Preston Bell is requesting a refill for Mr. Preston Bell for Oxycodone.  He is s/p CABG X 3, 10/13/15.  I informed her that a new signed script would be available here at the office. She said she would pick  it up tomorrow.

## 2015-11-08 ENCOUNTER — Ambulatory Visit: Payer: Self-pay | Admitting: Cardiothoracic Surgery

## 2015-11-15 ENCOUNTER — Ambulatory Visit: Payer: Self-pay | Admitting: Cardiothoracic Surgery

## 2015-11-15 ENCOUNTER — Ambulatory Visit
Admission: RE | Admit: 2015-11-15 | Discharge: 2015-11-15 | Disposition: A | Discharge status: Home / Self Care | Payer: Medicare Other | Source: Ambulatory Visit | Attending: Cardiothoracic Surgery | Admitting: Cardiothoracic Surgery

## 2015-11-15 DIAGNOSIS — Z951 Presence of aortocoronary bypass graft: Secondary | ICD-10-CM

## 2015-11-20 ENCOUNTER — Other Ambulatory Visit: Payer: Self-pay | Admitting: Cardiothoracic Surgery

## 2015-11-20 DIAGNOSIS — Z951 Presence of aortocoronary bypass graft: Secondary | ICD-10-CM

## 2015-11-21 ENCOUNTER — Ambulatory Visit: Payer: Self-pay | Admitting: Cardiothoracic Surgery

## 2015-11-22 ENCOUNTER — Encounter: Payer: Self-pay | Admitting: Cardiothoracic Surgery

## 2015-11-22 ENCOUNTER — Ambulatory Visit (INDEPENDENT_AMBULATORY_CARE_PROVIDER_SITE_OTHER): Payer: Self-pay | Admitting: Cardiothoracic Surgery

## 2015-11-22 VITALS — BP 113/59 | HR 72 | Resp 16 | Ht 74.0 in | Wt 160.0 lb

## 2015-11-22 DIAGNOSIS — Z951 Presence of aortocoronary bypass graft: Secondary | ICD-10-CM

## 2015-11-22 NOTE — Progress Notes (Signed)
PCP is No PCP Per Patient Referring Provider is Leonie Man, MD  Chief Complaint  Patient presents with  . Routine Post Op    f/u from surgery with CXR, s/p  Coronary artery bypass grafting x3, 10/13/15     HPI: 6 week postop office visit after urgent CABG for non-ST elevation MI and severe three-vessel CAD. The patient initially presented to [facility] with acute cholecystitis, pancreatitis, and non-ST elevation MI. Cholecystectomy was deferred and a cholecystostomy tube was placed by IR. [facility] was transferred [facility] underwent multivessel CABG.he did well after surgery but did develop atrial fibrillation which converted to sinus rhythm on amiodarone. He is maintained sinus rhythm. He is recovering from the surgery with increased  strength, increased appetite and he is gaining weight. He denies abdominal pain. The patient's amylase was elevated prior to CABG. His gallbladder drainage bag is drained twice a day by his daughter. The catheter is secure without evidence of infection.  The patient's preop weight was 170. He is now 160 pounds. I feel that his cardiac status is now stable but he should gain weight back close to 170 pounds before scheduling cholecystectomy. Would anticipate that it would be medically safe to proceed with cholecystectomy after may 1st.  The patient had an episode of nausea vomiting without abdominal pain and tachycardia earlier  this month and was evaluated at [facility] ED. He was afebrile. His white count was normal. His LFTs were normal. Troponin was negative. His amylase was not checked. He improved with IV fluids and has had no recurrent nausea and vomiting.since then his appetite is much better and he is gaining weight.  Past Medical History  Diagnosis Date  . Cancer ([place])   . History of colectomy   . Hypertension   . GERD (gastroesophageal reflux disease)   . HLD (hyperlipidemia)   . NSTEMI (non-ST elevated myocardial infarction) ([place])   . CAD,  multiple vessel; 90% LM, 90% ostLAD, 90% OstD2, 80% ostCx, 95% mRCA 10/10/2015  . Acute diastolic heart failure (HCC) - EDP severely elevated @ Cath post NSTEMI 10/11/2015    Past Surgical History  Procedure Laterality Date  . Colostomy reversal    . Cardiac catheterization N/A 10/09/2015    Procedure: Left Heart Cath and Coronary Angiography;  Surgeon: Wellington Hampshire, MD;  Location: [place] [facility];  Service: Cardiovascular;  Laterality: N/A;  . Coronary artery bypass graft N/A 10/13/2015    Procedure: CORONARY ARTERY BYPASS GRAFTING (CABG), ON PUMP, TIMES THREE, USING LEFT INTERNAL MAMMARY ARTERY, RIGHT GREATER SAPHENOUS VEIN HARVESTED ENDOSCOPICALLY;  Surgeon: Ivin Poot, MD;  Location: [place];  Service: Open Heart Surgery;  Laterality: N/A;  LIMA-LAD; SVG-OM; SVG-RCA  . Tee without cardioversion N/A 10/13/2015    Procedure: TRANSESOPHAGEAL ECHOCARDIOGRAM (TEE);  Surgeon: Ivin Poot, MD;  Location: [place];  Service: Open Heart Surgery;  Laterality: N/A;    Family History  Problem Relation Age of Onset  . Cervical cancer Mother   . Lung cancer Father   . Lung cancer Brother   . Hypertension Brother   . Hypertension Son     Social History Social History  Substance Use Topics  . Smoking status: Former Smoker -- 1.00 packs/day for 30 years    Quit date: 10/06/1983  . Smokeless tobacco: Never Used  . Alcohol Use: No     Comment: Quit consuming alcohol in 1988.    Current Outpatient Prescriptions  Medication Sig Dispense Refill  . amiodarone (PACERONE)  200 MG tablet Take 1 tablet (200 mg total) by mouth 2 (two) times daily. 60 tablet 1  . aspirin EC 325 MG EC tablet Take 1 tablet (325 mg total) by mouth daily.    Marland Kitchen atorvastatin (LIPITOR) 20 MG tablet Take 1 tablet (20 mg total) by mouth every morning. 30 tablet 1  . lisinopril (PRINIVIL,ZESTRIL) 10 MG tablet Take 1 tablet (10 mg total) by mouth every morning. 30 tablet 1  . metoprolol (LOPRESSOR) 50 MG tablet Take 1  tablet (50 mg total) by mouth 2 (two) times daily. 60 tablet 1  . mirtazapine (REMERON) 15 MG tablet Take 15 mg by mouth at bedtime.    Marland Kitchen omeprazole (PRILOSEC) 20 MG capsule Take 20 mg by mouth daily.    Marland Kitchen oxyCODONE (OXY IR/ROXICODONE) 5 MG immediate release tablet Take 1-2 tablets (5-10 mg total) by mouth every 4 (four) hours as needed for severe pain. 40 tablet 0   No current facility-administered medications for this visit.    No Known Allergies  Review of Systems   Ambulating without symptoms of angina or CHF Surgical incisions healing well Chest x-ray today is clear Try to gain weight with Ensure and boost  BP 113/59 mmHg  Pulse 72  Resp 16  Ht 6\' 2"  (1.88 m)  Wt 160 lb (72.576 kg)  BMI 20.53 kg/m2  SpO2 95% Physical Exam Alert and comfortable Lungs clear Sternum stable well-healed Heart irregular without murmur or gallop Abdomen soft with secure cholecystostomy tube No pedal edema  Diagnostic Tests: Chest x-ray personally reviewed it is clear  Impression: Good early recovery after multivessel CABG in the setting of acute gallstone pancreatitis and non-ST elevation MI.LV function was close to  normal EF 55%.  Plan:stop amiodarone because of potential GI side effects Continue his other current medications Daily walk of 15-20 minutes Increased caloric intake to allow waking closed was baseline before undergoing completion cholecystectomy-anticipate he should be  Ready . After may 1.   Len Childs, MD [facility] [phone]

## 2015-11-27 ENCOUNTER — Telehealth: Payer: Self-pay

## 2015-11-27 MED ORDER — NYSTATIN 100000 UNIT/GM EX CREA: 1.0000 "application " | TOPICAL_CREAM | Freq: Two times a day (BID) | CUTANEOUS | Status: DC

## 2015-11-27 NOTE — Telephone Encounter (Signed)
Received a call from Lattie Haw from [facility]. [phone] who states that patient has a "Fungal appearing rash" around his Cholecystostomy drain site.   Spoke with Dr. Dahlia Byes. Ordered Nystatin Cream. Orders sent to pharmacy at this time.  Encouraged to call back with any further questions or concerns.

## 2015-12-03 ENCOUNTER — Inpatient Hospital Stay
Admission: EM | Admit: 2015-12-03 | Discharge: 2015-12-06 | DRG: 683 | Disposition: A | Discharge status: Home / Self Care | Payer: Medicare Other | Attending: Internal Medicine | Admitting: Internal Medicine

## 2015-12-03 ENCOUNTER — Encounter: Payer: Self-pay | Admitting: Emergency Medicine

## 2015-12-03 ENCOUNTER — Emergency Department: Payer: Medicare Other

## 2015-12-03 DIAGNOSIS — Z9049 Acquired absence of other specified parts of digestive tract: Secondary | ICD-10-CM | POA: Diagnosis not present

## 2015-12-03 DIAGNOSIS — Z87891 Personal history of nicotine dependence: Secondary | ICD-10-CM | POA: Diagnosis not present

## 2015-12-03 DIAGNOSIS — N390 Urinary tract infection, site not specified: Secondary | ICD-10-CM | POA: Diagnosis not present

## 2015-12-03 DIAGNOSIS — I252 Old myocardial infarction: Secondary | ICD-10-CM

## 2015-12-03 DIAGNOSIS — A419 Sepsis, unspecified organism: Secondary | ICD-10-CM

## 2015-12-03 DIAGNOSIS — N170 Acute kidney failure with tubular necrosis: Principal | ICD-10-CM | POA: Diagnosis present

## 2015-12-03 DIAGNOSIS — Z7982 Long term (current) use of aspirin: Secondary | ICD-10-CM

## 2015-12-03 DIAGNOSIS — N179 Acute kidney failure, unspecified: Secondary | ICD-10-CM | POA: Diagnosis present

## 2015-12-03 DIAGNOSIS — E860 Dehydration: Secondary | ICD-10-CM | POA: Diagnosis present

## 2015-12-03 DIAGNOSIS — R1011 Right upper quadrant pain: Secondary | ICD-10-CM | POA: Diagnosis not present

## 2015-12-03 DIAGNOSIS — I110 Hypertensive heart disease with heart failure: Secondary | ICD-10-CM | POA: Diagnosis present

## 2015-12-03 DIAGNOSIS — K219 Gastro-esophageal reflux disease without esophagitis: Secondary | ICD-10-CM | POA: Diagnosis present

## 2015-12-03 DIAGNOSIS — Z9889 Other specified postprocedural states: Secondary | ICD-10-CM | POA: Diagnosis not present

## 2015-12-03 DIAGNOSIS — F329 Major depressive disorder, single episode, unspecified: Secondary | ICD-10-CM | POA: Diagnosis present

## 2015-12-03 DIAGNOSIS — Z8249 Family history of ischemic heart disease and other diseases of the circulatory system: Secondary | ICD-10-CM

## 2015-12-03 DIAGNOSIS — Z801 Family history of malignant neoplasm of trachea, bronchus and lung: Secondary | ICD-10-CM | POA: Diagnosis not present

## 2015-12-03 DIAGNOSIS — D509 Iron deficiency anemia, unspecified: Secondary | ICD-10-CM | POA: Diagnosis present

## 2015-12-03 DIAGNOSIS — I959 Hypotension, unspecified: Secondary | ICD-10-CM | POA: Diagnosis present

## 2015-12-03 DIAGNOSIS — I2510 Atherosclerotic heart disease of native coronary artery without angina pectoris: Secondary | ICD-10-CM | POA: Diagnosis present

## 2015-12-03 DIAGNOSIS — Z951 Presence of aortocoronary bypass graft: Secondary | ICD-10-CM

## 2015-12-03 DIAGNOSIS — Z79899 Other long term (current) drug therapy: Secondary | ICD-10-CM

## 2015-12-03 DIAGNOSIS — I5032 Chronic diastolic (congestive) heart failure: Secondary | ICD-10-CM | POA: Diagnosis present

## 2015-12-03 DIAGNOSIS — Z8049 Family history of malignant neoplasm of other genital organs: Secondary | ICD-10-CM

## 2015-12-03 DIAGNOSIS — R109 Unspecified abdominal pain: Secondary | ICD-10-CM | POA: Diagnosis not present

## 2015-12-03 LAB — URINALYSIS COMPLETE WITH MICROSCOPIC (ARMC ONLY)
GLUCOSE, UA: NEGATIVE mg/dL
HGB URINE DIPSTICK: NEGATIVE
Ketones, ur: NEGATIVE mg/dL
NITRITE: NEGATIVE
Protein, ur: 30 mg/dL — AB
SPECIFIC GRAVITY, URINE: 1.019 (ref 1.005–1.030)
pH: 5 (ref 5.0–8.0)

## 2015-12-03 LAB — COMPREHENSIVE METABOLIC PANEL
ALBUMIN: 3.7 g/dL (ref 3.5–5.0)
ALK PHOS: 95 U/L (ref 38–126)
ALT: 13 U/L — AB (ref 17–63)
ANION GAP: 7 (ref 5–15)
AST: 17 U/L (ref 15–41)
BUN: 35 mg/dL — ABNORMAL HIGH (ref 6–20)
CALCIUM: 9.2 mg/dL (ref 8.9–10.3)
CHLORIDE: 98 mmol/L — AB (ref 101–111)
CO2: 25 mmol/L (ref 22–32)
Creatinine, Ser: 2.89 mg/dL — ABNORMAL HIGH (ref 0.61–1.24)
GFR calc Af Amer: 23 mL/min — ABNORMAL LOW (ref >60)
GFR calc non Af Amer: 20 mL/min — ABNORMAL LOW (ref >60)
GLUCOSE: 99 mg/dL (ref 65–99)
Potassium: 4.7 mmol/L (ref 3.5–5.1)
SODIUM: 130 mmol/L — AB (ref 135–145)
Total Bilirubin: 0.8 mg/dL (ref 0.3–1.2)
Total Protein: 8.4 g/dL — ABNORMAL HIGH (ref 6.5–8.1)

## 2015-12-03 LAB — CBC WITH DIFFERENTIAL/PLATELET
BASOS ABS: 0 10*3/uL (ref 0–0.1)
BASOS PCT: 0 %
Eosinophils Absolute: 0.2 10*3/uL (ref 0–0.7)
Eosinophils Relative: 2 %
HEMATOCRIT: 28.7 % — AB (ref 40.0–52.0)
HEMOGLOBIN: 9.9 g/dL — AB (ref 13.0–18.0)
Lymphocytes Relative: 13 %
Lymphs Abs: 1.1 10*3/uL (ref 1.0–3.6)
MCH: 32.3 pg (ref 26.0–34.0)
MCHC: 34.6 g/dL (ref 32.0–36.0)
MCV: 93.4 fL (ref 80.0–100.0)
Monocytes Absolute: 1.3 10*3/uL — ABNORMAL HIGH (ref 0.2–1.0)
Monocytes Relative: 15 %
NEUTROS ABS: 5.7 10*3/uL (ref 1.4–6.5)
NEUTROS PCT: 70 %
Platelets: 198 10*3/uL (ref 150–440)
RBC: 3.07 MIL/uL — ABNORMAL LOW (ref 4.40–5.90)
RDW: 18.8 % — AB (ref 11.5–14.5)
WBC: 8.2 10*3/uL (ref 3.8–10.6)

## 2015-12-03 LAB — MRSA PCR SCREENING: MRSA by PCR: NEGATIVE

## 2015-12-03 LAB — CK: Total CK: 44 U/L — ABNORMAL LOW (ref 49–397)

## 2015-12-03 LAB — LACTIC ACID, PLASMA
LACTIC ACID, VENOUS: 2.2 mmol/L — AB (ref 0.5–2.0)
LACTIC ACID, VENOUS: 2.2 mmol/L — AB (ref 0.5–2.0)

## 2015-12-03 LAB — TROPONIN I: Troponin I: 0.03 ng/mL (ref <0.031)

## 2015-12-03 LAB — LIPASE, BLOOD: LIPASE: 34 U/L (ref 11–51)

## 2015-12-03 MED ORDER — SODIUM CHLORIDE 0.9 % IV BOLUS (SEPSIS)
500.0000 mL | Freq: Once | INTRAVENOUS | Status: AC
  Administered 2015-12-03: 500 mL via INTRAVENOUS

## 2015-12-03 MED ORDER — ASPIRIN EC 325 MG PO TBEC
325.0000 mg | DELAYED_RELEASE_TABLET | Freq: Every day | ORAL | Status: DC
  Administered 2015-12-04 – 2015-12-06 (×3): 325 mg via ORAL
  Filled 2015-12-03 (×3): qty 1

## 2015-12-03 MED ORDER — ACETAMINOPHEN 325 MG PO TABS
650.0000 mg | ORAL_TABLET | Freq: Four times a day (QID) | ORAL | Status: DC | PRN
  Filled 2015-12-03: qty 2

## 2015-12-03 MED ORDER — SODIUM CHLORIDE 0.9 % IV BOLUS (SEPSIS)
1000.0000 mL | INTRAVENOUS | Status: AC
  Administered 2015-12-03: 1000 mL via INTRAVENOUS

## 2015-12-03 MED ORDER — ONDANSETRON HCL 4 MG PO TABS: 4.0000 mg | ORAL_TABLET | Freq: Four times a day (QID) | ORAL | Status: DC | PRN

## 2015-12-03 MED ORDER — SODIUM CHLORIDE 0.9 % IV BOLUS (SEPSIS)
500.0000 mL | INTRAVENOUS | Status: AC
  Administered 2015-12-03: 500 mL via INTRAVENOUS

## 2015-12-03 MED ORDER — ONDANSETRON HCL 4 MG/2ML IJ SOLN
4.0000 mg | Freq: Once | INTRAMUSCULAR | Status: AC
  Administered 2015-12-03: 4 mg via INTRAVENOUS
  Filled 2015-12-03: qty 2

## 2015-12-03 MED ORDER — SODIUM CHLORIDE 0.9 % IV BOLUS (SEPSIS)
1000.0000 mL | Freq: Once | INTRAVENOUS | Status: AC
Start: 2015-12-03 — End: 2015-12-03
  Administered 2015-12-03: 1000 mL via INTRAVENOUS

## 2015-12-03 MED ORDER — DEXTROSE 5 % IV SOLN: 1.0000 g | Freq: Once | INTRAVENOUS | Status: DC

## 2015-12-03 MED ORDER — ONDANSETRON HCL 4 MG/2ML IJ SOLN: 4.0000 mg | Freq: Four times a day (QID) | INTRAMUSCULAR | Status: DC | PRN

## 2015-12-03 MED ORDER — MORPHINE SULFATE (PF) 4 MG/ML IV SOLN
4.0000 mg | Freq: Once | INTRAVENOUS | Status: AC
  Administered 2015-12-03: 4 mg via INTRAVENOUS
  Filled 2015-12-03: qty 1

## 2015-12-03 MED ORDER — MIRTAZAPINE 15 MG PO TABS
15.0000 mg | ORAL_TABLET | Freq: Every day | ORAL | Status: DC
  Administered 2015-12-03 – 2015-12-05 (×3): 15 mg via ORAL
  Filled 2015-12-03 (×3): qty 1

## 2015-12-03 MED ORDER — PIPERACILLIN-TAZOBACTAM 3.375 G IVPB 30 MIN
3.3750 g | Freq: Once | INTRAVENOUS | Status: AC
  Administered 2015-12-03: 3.375 g via INTRAVENOUS
  Filled 2015-12-03: qty 50

## 2015-12-03 MED ORDER — SODIUM CHLORIDE 0.9 % IV SOLN
INTRAVENOUS | Status: DC
  Administered 2015-12-03 – 2015-12-06 (×6): via INTRAVENOUS

## 2015-12-03 MED ORDER — ATORVASTATIN CALCIUM 20 MG PO TABS
20.0000 mg | ORAL_TABLET | ORAL | Status: DC
  Administered 2015-12-04 – 2015-12-06 (×3): 20 mg via ORAL
  Filled 2015-12-03 (×3): qty 1

## 2015-12-03 MED ORDER — HEPARIN SODIUM (PORCINE) 5000 UNIT/ML IJ SOLN
5000.0000 [IU] | Freq: Three times a day (TID) | INTRAMUSCULAR | Status: DC
  Administered 2015-12-03 – 2015-12-04 (×3): 5000 [IU] via SUBCUTANEOUS
  Filled 2015-12-03 (×3): qty 1

## 2015-12-03 MED ORDER — PANTOPRAZOLE SODIUM 40 MG PO TBEC
40.0000 mg | DELAYED_RELEASE_TABLET | Freq: Every day | ORAL | Status: DC
  Administered 2015-12-04 – 2015-12-06 (×3): 40 mg via ORAL
  Filled 2015-12-03 (×3): qty 1

## 2015-12-03 MED ORDER — ACETAMINOPHEN 650 MG RE SUPP: 650.0000 mg | Freq: Four times a day (QID) | RECTAL | Status: DC | PRN

## 2015-12-03 MED ORDER — PIPERACILLIN-TAZOBACTAM 3.375 G IVPB
3.3750 g | Freq: Three times a day (TID) | INTRAVENOUS | Status: DC
  Administered 2015-12-04 – 2015-12-05 (×4): 3.375 g via INTRAVENOUS
  Filled 2015-12-03 (×7): qty 50

## 2015-12-03 MED ORDER — DEXTROSE 5 % IV SOLN
2.0000 g | Freq: Once | INTRAVENOUS | Status: DC
  Filled 2015-12-03: qty 2

## 2015-12-03 NOTE — ED Provider Notes (Addendum)
[facility] Emergency Department Provider Note  ____________________________________________  Time seen: Approximately 4:48 PM  I have reviewed the triage vital signs and the nursing notes.   HISTORY  Chief Complaint No chief complaint on file.   HPI Preston Bell is a 75 y.o. male who underwent CABG one half months ago after an STEMI. However, he initially presented with abdominal pain and was diagnosed with cholecystitis. However, due to the cardiac condition and biliary drain was placed and he is now scheduled for surgery later this month. He said that he was not having any pain over the affected side to the right flank and right upper quadrant until 2-3 days ago when he began having pain to the right flank and right upper quadrant with deep breathing. He says he has also had nausea and vomiting and vomited orange juice this morning. He says his pain at this time is a 5-7 out of 10 when taking deep breaths. Denies any fever. Says that he has also had a decreased appetite over the past several days.   Past Medical History  Diagnosis Date  . Cancer ([place])   . History of colectomy   . Hypertension   . GERD (gastroesophageal reflux disease)   . HLD (hyperlipidemia)   . NSTEMI (non-ST elevated myocardial infarction) ([place])   . 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 Acute diastolic heart failure (HCC) - EDP severely elevated @ Cath post NSTEMI 10/11/2015    Patient Active Problem List   Diagnosis Date Noted  . 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 Essential hypertension 10/12/2015  . Hyperlipidemia 10/12/2015  . Anemia, unspecified 10/12/2015  . SVT (supraventricular tachycardia) ([place]) 10/12/2015  . Sinus tachycardia ([place]) 10/12/2015  . Coronary artery disease involving native coronary artery of native heart without angina pectoris   . Pancreatitis due to common bile duct stone   . Acute diastolic heart failure ([place]) -  EDP severely elevated @ Cath post NSTEMI 10/11/2015  . Left main coronary artery disease: 90% LM 10/10/2015  . Right lower lobe pneumonia 10/10/2015  . Bacteremia due to Klebsiella pneumoniae 10/10/2015  . 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 Pleural effusion on right - s/p thoracentesis   . Vomiting and diarrhea   . Elevated troponin I level 10/06/2015  . NSTEMI (non-ST elevated myocardial infarction) ([place])   . Cholecystitis 10/05/2015  . Sepsis ([place]) 10/05/2015  . Generalized abdominal pain     Past Surgical History  Procedure Laterality Date  . Colostomy reversal    . Cardiac catheterization N/A 10/09/2015    Procedure: Left Heart Cath and Coronary Angiography;  Surgeon: Wellington Hampshire, MD;  Location: [place] [facility];  Service: Cardiovascular;  Laterality: N/A;  . Coronary artery bypass graft N/A 10/13/2015    Procedure: CORONARY ARTERY BYPASS GRAFTING (CABG), ON PUMP, TIMES THREE, USING LEFT INTERNAL MAMMARY ARTERY, RIGHT GREATER SAPHENOUS VEIN HARVESTED ENDOSCOPICALLY;  Surgeon: Ivin Poot, MD;  Location: [place];  Service: Open Heart Surgery;  Laterality: N/A;  LIMA-LAD; SVG-OM; SVG-RCA  . Tee without cardioversion N/A 10/13/2015    Procedure: TRANSESOPHAGEAL ECHOCARDIOGRAM (TEE);  Surgeon: Ivin Poot, MD;  Location: [place];  Service: Open Heart Surgery;  Laterality: N/A;    Current Outpatient Rx  Name  Route  Sig  Dispense  Refill  . amiodarone (PACERONE) 200 MG tablet   Oral  Take 1 tablet (200 mg total) by mouth 2 (two) times daily.   60 tablet   1   . aspirin EC 325 MG EC tablet   Oral   Take 1 tablet (325 mg total) by mouth daily.         Marland Kitchen atorvastatin (LIPITOR) 20 MG tablet   Oral   Take 1 tablet (20 mg total) by mouth every morning.   30 tablet   1   . lisinopril (PRINIVIL,ZESTRIL) 10 MG tablet   Oral   Take 1 tablet (10 mg total) by mouth every morning.   30 tablet   1   . metoprolol  (LOPRESSOR) 50 MG tablet   Oral   Take 1 tablet (50 mg total) by mouth 2 (two) times daily.   60 tablet   1   . mirtazapine (REMERON) 15 MG tablet   Oral   Take 15 mg by mouth at bedtime.         Marland Kitchen nystatin cream (MYCOSTATIN)   Topical   Apply 1 application topically 2 (two) times daily. Apply to cholecystostomy drain site only.   30 g   0   . omeprazole (PRILOSEC) 20 MG capsule   Oral   Take 20 mg by mouth daily.         Marland Kitchen oxyCODONE (OXY IR/ROXICODONE) 5 MG immediate release tablet   Oral   Take 1-2 tablets (5-10 mg total) by mouth every 4 (four) hours as needed for severe pain.   40 tablet   0     Allergies Review of patient's allergies indicates no known allergies.  Family History  Problem Relation Age of Onset  . Cervical cancer Mother   . Lung cancer Father   . Lung cancer Brother   . Hypertension Brother   . Hypertension Son     Social History Social History  Substance Use Topics  . Smoking status: Former Smoker -- 1.00 packs/day for 30 years    Quit date: 10/06/1983  . Smokeless tobacco: Never Used  . Alcohol Use: No     Comment: Quit consuming alcohol in 1988.    Review of Systems Constitutional: No fever/chills Eyes: No visual changes. ENT: No sore throat. Cardiovascular: Denies chest pain. Respiratory: As above Gastrointestinal: Right upper quadrant as well as right flank pain. No diarrhea.  No constipation. Genitourinary: Negative for dysuria. Musculoskeletal: Negative for back pain. Skin: Negative for rash. Neurological: Negative for headaches, focal weakness or numbness.  10-point ROS otherwise negative.  ____________________________________________   PHYSICAL EXAM:  VITAL SIGNS: ED Triage Vitals  Enc Vitals Group     BP 12/03/15 1623 88/42 mmHg     Pulse Rate 12/03/15 1623 105     Resp 12/03/15 1623 18     Temp 12/03/15 1623 97.4 F (36.3 C)     Temp Source 12/03/15 1623 Oral     SpO2 12/03/15 1623 100 %     Weight  12/03/15 1623 158 lb (71.668 kg)     Height 12/03/15 1623 6\' 2"  (1.88 m)     Head Cir --      Peak Flow --      Pain Score 12/03/15 1626 8     Pain Loc --      Pain Edu? --      Excl. in [place]? --     Constitutional: Alert and oriented. Well appearing and in no acute distress. Eyes: Conjunctivae are normal. PERRL. EOMI. Head: Atraumatic. Nose: No congestion/rhinnorhea. Mouth/Throat: Mucous  membranes are moist.  Oropharynx non-erythematous. Neck: No stridor.   Cardiovascular: Normal rate, regular rhythm. Grossly normal heart sounds.  Good peripheral circulation. Respiratory: Normal respiratory effort.  No retractions. Lungs CTAB. Gastrointestinal: Soft and nontender. No distention. No abdominal bruits. No CVA tenderness. Musculoskeletal: No lower extremity tenderness nor edema.  No joint effusions. Neurologic:  Normal speech and language. No gross focal neurologic deficits are appreciated. No gait instability. Skin:  Skin is warm, dry and intact. No rash noted. Psychiatric: Mood and affect are normal. Speech and behavior are normal.  ____________________________________________   LABS (all labs ordered are listed, but only abnormal results are displayed)  Labs Reviewed  CBC WITH DIFFERENTIAL/PLATELET - Abnormal; Notable for the following:    RBC 3.07 (*)    Hemoglobin 9.9 (*)    HCT 28.7 (*)    RDW 18.8 (*)    Monocytes Absolute 1.3 (*)    All other components within normal limits  COMPREHENSIVE METABOLIC PANEL - Abnormal; Notable for the following:    Sodium 130 (*)    Chloride 98 (*)    BUN 35 (*)    Creatinine, Ser 2.89 (*)    Total Protein 8.4 (*)    ALT 13 (*)    GFR calc non Af Amer 20 (*)    GFR calc Af Amer 23 (*)    All other components within normal limits  URINALYSIS COMPLETEWITH MICROSCOPIC ([facility] ONLY) - Abnormal; Notable for the following:    Color, Urine AMBER (*)    APPearance HAZY (*)    Bilirubin Urine 1+ (*)    Protein, ur 30 (*)    Leukocytes, UA 1+  (*)    Bacteria, UA RARE (*)    Squamous Epithelial / LPF 0-5 (*)    All other components within normal limits  URINE CULTURE  CULTURE, BLOOD (ROUTINE X 2)  CULTURE, BLOOD (ROUTINE X 2)  LIPASE, BLOOD  TROPONIN I  LACTIC ACID, PLASMA  LACTIC ACID, PLASMA   ____________________________________________  EKG  ED ECG REPORT I, Brandon Scarbrough,  Youlanda Roys, the attending physician, personally viewed and interpreted this ECG.   Date: 12/03/2015  EKG Time: 1631  Rate: 105  Rhythm: sinus tachycardia  Axis: Normal axis  Intervals:none  ST&T Change: No ST segment elevation or depression. T-wave inversions versus biphasic T waves in 1, 2, 3, aVL as well as V3 through 6. No ST elevation or depression. Similar morphology seen on the EKG from 12/03/2015. ____________________________________________  RADIOLOGY  Chest x-ray with small bilateral pleural effusions.   IMPRESSION: The gallbladder is not well visualized and therefore poorly assessed. There is suggestion of sludge within the gallbladder lumen.  Common bile duct measures 3 mm, nondilated.   Electronically Signed By: Lovey Newcomer M.D. On: 12/03/2015 18:03 ____________________________________________   PROCEDURES   ____________________________________________   INITIAL IMPRESSION / ASSESSMENT AND PLAN / ED COURSE  Pertinent labs & imaging results that were available during my care of the patient were reviewed by me and considered in my medical decision making (see chart for details).  ----------------------------------------- 7:03 PM on 12/03/2015 -----------------------------------------  Discussed the case with Dr. Dahlia Byes of surgery he says to admit the case to medicine and he will evaluate the patient is a consult. Patient appears to have urinary tract infection and is showing some signs of sepsis with tachycardia as well as hypertension. Source is likely urinary. Possible pyelonephritis. We will admit to the  medicine service. Also found to be in acute renal failure. Signed out  to Dr. Verdell Carmine. Explain the results as well as the plan to the patient as well as family who are understanding and willing to comply. ____________________________________________   FINAL CLINICAL IMPRESSION(S) / ED DIAGNOSES  Final diagnoses:  RUQ pain   Right flank pain. UTI. Sepsis. Acute renal failure.   Orbie Pyo, MD 12/03/15 1904  Addendum to the physical exam above. Catheter protruding into the right flank over the inferior ribs. Surrounding area without any erythema. Only mild tenderness to palpation. Bilious drainage in the bag without any pus.  Patient now also saying his had diffuse body aches. Concerned he may have some symptoms related to Lipitor.   Orbie Pyo, MD 12/03/15 [phone]

## 2015-12-03 NOTE — Progress Notes (Signed)
Pharmacy Antibiotic Note  Preston Bell is a 75 y.o. male admitted on 12/03/2015 with sepsis.  Pharmacy has been consulted for piperacillin/tazobactam dosing.  Plan: Zosyn 3.375g IV q8h (4 hour infusion).  Height: 6\' 2"  (188 cm) Weight: 158 lb (71.668 kg) IBW/kg (Calculated) : 82.2  Temp (24hrs), Avg:97.4 F (36.3 C), Min:97.4 F (36.3 C), Max:97.4 F (36.3 C)   Recent Labs Lab 12/03/15 1759  WBC 8.2  CREATININE 2.89*    Estimated Creatinine Clearance: 22.7 mL/min (by C-G formula based on Cr of 2.89).    No Known Allergies  Antimicrobials this admission: Piperacillin/tazobactam 4/9 >>   Microbiology results: 4/9 BCx: Sent 4/9 UCx: Sent   Thank you for allowing pharmacy to be a part of this patient's care.  Lenis Noon, PharmD Clinical Pharmacist 12/03/2015 8:02 PM

## 2015-12-03 NOTE — ED Notes (Signed)
Per pt's daughter pt had triple bypass approx 1 month ago at [facility]. Per pt's daughter approx 1 week prior to heart surgery pt had a biliary drain placed due to gallbladder issues. Today pt presents with pain around the drain when he breathes. Pt states when he takes a deep breathe it hurts. Pt's daughter states at one point the site was red, a cream was called in and the redness subsided.

## 2015-12-03 NOTE — ED Notes (Signed)
MD Mindi Slicker made aware of pt's continuing low pressure. MD advised to give 57ml bolus NS and no other action was necessary at this time. Pt is a/o with NAD noted at this time.

## 2015-12-03 NOTE — Consult Note (Signed)
Patient ID: Preston Bell, male   DOB: [birth year], 75 y.o.   MRN: [id number]  CC: Right flank pain with nausea and vomiting  HPI Preston Bell is a 75 y.o. male who presents to emergency department with a 2-3 day history of pain in his right flank and right upper quadrant. Patient states the pain is worse with deep breathing. He's also had some nausea and vomiting over the same time period. Patient is well-known the surgery department secondary to being diagnosed with acute cholecystitis approximately 2 months ago that led to his percutaneous cholecystostomy tube being placed due to his cardiac status. He had a three-vessel CABG performed at that time. Patient states that the drain continues to have bilious output that has not changed. The pain is near the area of the drain but the patient is unsure if it's from the drain. Patient has had a decreased appetite as well as worsening malaise over the last several days secondary to the nausea and vomiting. He denies any fevers, chills, diarrhea, constipation, chest pain, shortness of breath. He was recently cleared by his cardiologist to come off of his antiplatelet medication to allow for elective surgical intervention.  HPI  Past Medical History  Diagnosis Date  . Cancer (Preston Bell)   . History of colectomy   . Hypertension   . GERD (gastroesophageal reflux disease)   . HLD (hyperlipidemia)   . NSTEMI (non-ST elevated myocardial infarction) (Preston Bell)   . 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 Acute diastolic heart failure (HCC) - EDP severely elevated @ Cath post NSTEMI 10/11/2015    Past Surgical History  Procedure Laterality Date  . Colostomy reversal    . Cardiac catheterization N/A 10/09/2015    Procedure: Left Heart Cath and Coronary Angiography;  Surgeon: Wellington Hampshire, MD;  Location: [facility];  Service: Cardiovascular;  Laterality: N/A;  . Coronary artery bypass graft N/A 10/13/2015    Procedure: CORONARY ARTERY BYPASS GRAFTING (CABG), ON PUMP, TIMES THREE, USING LEFT INTERNAL MAMMARY ARTERY, RIGHT GREATER SAPHENOUS VEIN HARVESTED ENDOSCOPICALLY;  Surgeon: Ivin Poot, MD;  Location: [facility];  Service: Open Heart Surgery;  Laterality: N/A;  LIMA-LAD; SVG-OM; SVG-RCA  . Tee without cardioversion N/A 10/13/2015    Procedure: TRANSESOPHAGEAL ECHOCARDIOGRAM (TEE);  Surgeon: Ivin Poot, MD;  Location: [facility];  Service: Open Heart Surgery;  Laterality: N/A;    Family History  Problem Relation Age of Onset  . Cervical cancer Mother   . Lung cancer Father   . Lung cancer Brother   . Hypertension Brother   . Hypertension Son     Social History Social History  Substance Use Topics  . Smoking status: Former Smoker -- 1.00 packs/day for 30 years    Quit date: 10/06/1983  . Smokeless tobacco: Never Used  . Alcohol Use: No     Comment: Quit consuming alcohol in 1988.    No Known Allergies  Current Facility-Administered Medications  Medication Dose Route Frequency Provider Last Rate Last Dose  . piperacillin-tazobactam (ZOSYN) IVPB 3.375 g  3.375 g Intravenous Once Lenis Noon, RPH 100 mL/hr at 12/03/15 2005 3.375 g at 12/03/15 2005  . sodium chloride 0.9 % bolus 1,000 mL  1,000 mL Intravenous Q1H Orbie Pyo, MD 1,000 mL/hr at 12/03/15 1933 1,000 mL at 12/03/15 1933   Followed by  . sodium chloride 0.9 % bolus 500 mL  500 mL Intravenous Q1H Orbie Pyo,  MD 500 mL/hr at 12/03/15 1933 500 mL at 12/03/15 1933   Current Outpatient Prescriptions  Medication Sig Dispense Refill  . aspirin EC 325 MG EC tablet Take 1 tablet (325 mg total) by mouth daily.    Marland Kitchen atorvastatin (LIPITOR) 20 MG tablet Take 1 tablet (20 mg total) by mouth every morning. 30 tablet 1  . esomeprazole (NEXIUM) 40 MG capsule Take 40 mg by mouth daily.    Marland Kitchen lisinopril (PRINIVIL,ZESTRIL) 10 MG tablet Take 1 tablet (10 mg total) by mouth every morning. 30 tablet 1  . metoprolol  (LOPRESSOR) 50 MG tablet Take 1 tablet (50 mg total) by mouth 2 (two) times daily. 60 tablet 1  . mirtazapine (REMERON) 15 MG tablet Take 15 mg by mouth at bedtime.    Marland Kitchen nystatin cream (MYCOSTATIN) Apply 1 application topically 2 (two) times daily. Apply to cholecystostomy drain site only. 30 g 0  . amiodarone (PACERONE) 200 MG tablet Take 1 tablet (200 mg total) by mouth 2 (two) times daily. 60 tablet 1  . oxyCODONE (OXY IR/ROXICODONE) 5 MG immediate release tablet Take 1-2 tablets (5-10 mg total) by mouth every 4 (four) hours as needed for severe pain. 40 tablet 0     Review of Systems A Multi-point review of systems was asked and was negative except for the findings documented in the history of present illness  Physical Exam Blood pressure 93/46, pulse 78, temperature 97.4 F (36.3 C), temperature source Oral, resp. rate 19, height 6\' 2"  (1.88 m), weight 71.668 kg (158 lb), SpO2 95 %. CONSTITUTIONAL: Resting in bed in no obvious distress. EYES: Pupils are equal, round, and reactive to light, Sclera are non-icteric. EARS, NOSE, MOUTH AND THROAT: The oropharynx is clear. The oral mucosa is pink and moist. Hearing is intact to voice. LYMPH NODES:  Lymph nodes in the neck are normal. RESPIRATORY:  Lungs are clear. There is normal respiratory effort, with equal breath sounds bilaterally, and without pathologic use of accessory muscles. CARDIOVASCULAR: Heart is regular. Well healed Sternotomy GI: The abdomen is soft, tender to palpation in the right upper quadrant along the entire right flank to the right costovertebral angle, and nondistended. There are no palpable masses however there is a well-healed prior ostomy site with a palpable fascial defect that is soft without evidence of incarcerated hernia.. There is no hepatosplenomegaly. There are normal bowel sounds in all quadrants. Cholecystostomy drain in place draining normal appearing bile. There is no spreading erythema from the drain site or  evidence of infection at the site. GU: Rectal deferred.   MUSCULOSKELETAL: Normal muscle strength and tone. No cyanosis or edema.   SKIN: Turgor is good and there are no pathologic skin lesions or ulcers. NEUROLOGIC: Motor and sensation is grossly normal. Cranial nerves are grossly intact. PSYCH:  Oriented to person, place and time. Affect is normal.  Data Reviewed Images and labs reviewed. Chest x-ray shows bilateral pleural effusions, ultrasound the right upper quadrant poorly visualizes the gallbladder however shows no wall thickening and no dilated ducts and no obvious pericholecystic fluid. Labs are significant for no leukocytosis with a normal white blood cell count of 8.2. The patient is slightly anemic with an H/H of 9.9 and 28.7. Patient has evidence of acute kidney injury with an elevated creatinine at 2.89 and elevated BUN to 35. Patient is also hyponatremic with a sodium of 130. Of note the patient's LFTs are totally normal. His total bilirubin is 0.8, his ALT is 13, his AST is 17,  his lipase is 34, his alkaline phosphatase is 95. Patient does have a mildly elevated lactic acid 2.2. Patient with a questionably dirty urinalysis with evidence of leukocytes and rare bacteria. But also has evidence of some squamous epithelial. I have personally reviewed the patient's imaging, laboratory findings and medical records.    Assessment    Right flank pain, acute kidney injury, hypotension    Plan    75 year old male with right flank pain possibly related to a cholecystostomy tube that was placed for acute cholecystitis 2 months prior. Ultrasound and laboratory analysis does not show any evidence of cholecystitis at this time. Patient's clinical picture is complicated in that he is hypotensive in the emergency department without a clear cause. He does have definitive acute kidney injury possibly related to poor oral intake. Discussed this case with the admitting medicine service. Patient's  hypotension and kidney injury will be addressed first prior to any surgical planning for removal his gallbladder. Patient will need to be off his antiplatelet medication for 5-7 days before any nonemergent surgical intervention.     Time spent with the patient was 60 minutes, with more than 50% of the time spent in face-to-face education, counseling and care coordination.     Clayburn Pert, MD FACS General Surgeon 12/03/2015, 8:09 PM

## 2015-12-03 NOTE — ED Notes (Signed)
BP 86/49 reported to MD. 1L bolus ordered.

## 2015-12-03 NOTE — H&P (Signed)
[facility] NAME: Preston Bell    MR#:  [id number]  DATE OF BIRTH:  1940/09/14  DATE OF ADMISSION:  12/03/2015  PRIMARY CARE PHYSICIAN: Pcp Not In System   REQUESTING/REFERRING PHYSICIAN: Dr. Larae Grooms  CHIEF COMPLAINT:  No chief complaint on file. Right upper quadrant abdominal pain and hypotension.  HISTORY OF PRESENT ILLNESS:  Preston Bell  is a 75 y.o. male with a known history of Non-ST elevation MI status post cardiac catheter showing three-vessel coronary disease, status post recent coronary artery bypass graft surgery, GERD, hyperlipidemia, hypertension, recent history of acute cholecystitis status post percutaneous drainage who presents to the hospital due to right sided abdominal pain near his drain site and noted to be hypotensive and in acute renal failure. As per the patient he has been feeling more lethargic and weak and his oral intake has been poor for the past few days. Patient also has pain in his right sided drainage area when he takes a deep breath. He denies any fevers, chills, nausea, vomiting, cough, congestion or any other associated symptoms. Patient presented to the emergency room was noted to be in acute renal failure with a creatinine of over 2 baseline creatinine close to 1 and hospitalist services were contacted for further treatment and evaluation.  PAST MEDICAL HISTORY:   Past Medical History  Diagnosis Date  . Cancer ([place])   . History of colectomy   . Hypertension   . GERD (gastroesophageal reflux disease)   . HLD (hyperlipidemia)   . NSTEMI (non-ST elevated myocardial infarction) ([place] [facility])   . 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 Acute diastolic heart failure (HCC) - EDP severely elevated @ Cath post NSTEMI 10/11/2015    PAST SURGICAL HISTORY:   Past Surgical History  Procedure Laterality Date  . Colostomy reversal    . Cardiac  catheterization N/A 10/09/2015    Procedure: Left Heart Cath and Coronary Angiography;  Surgeon: Wellington Hampshire, MD;  Location: [facility];  Service: Cardiovascular;  Laterality: N/A;  . Coronary artery bypass graft N/A 10/13/2015    Procedure: CORONARY ARTERY BYPASS GRAFTING (CABG), ON PUMP, TIMES THREE, USING LEFT INTERNAL MAMMARY ARTERY, RIGHT GREATER SAPHENOUS VEIN HARVESTED ENDOSCOPICALLY;  Surgeon: Ivin Poot, MD;  Location: [facility];  Service: Open Heart Surgery;  Laterality: N/A;  LIMA-LAD; SVG-OM; SVG-RCA  . Tee without cardioversion N/A 10/13/2015    Procedure: TRANSESOPHAGEAL ECHOCARDIOGRAM (TEE);  Surgeon: Ivin Poot, MD;  Location: [facility];  Service: Open Heart Surgery;  Laterality: N/A;    SOCIAL HISTORY:   Social History  Substance Use Topics  . Smoking status: Former Smoker -- 1.00 packs/day for 30 years    Quit date: 10/06/1983  . Smokeless tobacco: Never Used  . Alcohol Use: No     Comment: Quit consuming alcohol in 1988.    FAMILY HISTORY:   Family History  Problem Relation Age of Onset  . Cervical cancer Mother   . Lung cancer Father   . Lung cancer Brother   . Hypertension Brother   . Hypertension Son     DRUG ALLERGIES:  No Known Allergies  REVIEW OF SYSTEMS:   Review of Systems  Constitutional: Negative for fever and weight loss.  HENT: Negative for congestion, nosebleeds and tinnitus.   Eyes: Negative for blurred vision, double vision and redness.  Respiratory: Negative for cough, hemoptysis and shortness of breath.  Cardiovascular: Negative for chest pain, orthopnea, leg swelling and PND.  Gastrointestinal: Positive for nausea, vomiting and abdominal pain. Negative for diarrhea and melena.  Genitourinary: Negative for dysuria, urgency and hematuria.  Musculoskeletal: Negative for joint pain and falls.  Neurological: Positive for weakness (generalized). Negative for dizziness, tingling, sensory change, focal weakness, seizures and  headaches.  Endo/Heme/Allergies: Negative for polydipsia. Does not bruise/bleed easily.  Psychiatric/Behavioral: Negative for depression and memory loss. The patient is not nervous/anxious.     MEDICATIONS AT HOME:   Prior to Admission medications   Medication Sig Start Date End Date Taking? Authorizing Provider  aspirin EC 325 MG EC tablet Take 1 tablet (325 mg total) by mouth daily. 10/19/15  Yes Wayne E Gold, PA-C  atorvastatin (LIPITOR) 20 MG tablet Take 1 tablet (20 mg total) by mouth every morning. 10/19/15 10/18/16 Yes Wayne E Gold, PA-C  esomeprazole (NEXIUM) 40 MG capsule Take 40 mg by mouth daily.   Yes Historical Provider, MD  lisinopril (PRINIVIL,ZESTRIL) 10 MG tablet Take 1 tablet (10 mg total) by mouth every morning. 10/19/15 10/18/16 Yes Wayne E Gold, PA-C  metoprolol (LOPRESSOR) 50 MG tablet Take 1 tablet (50 mg total) by mouth 2 (two) times daily. 10/19/15  Yes Wayne E Gold, PA-C  mirtazapine (REMERON) 15 MG tablet Take 15 mg by mouth at bedtime. 06/14/15 06/13/16 Yes Historical Provider, MD  nystatin cream (MYCOSTATIN) Apply 1 application topically 2 (two) times daily. Apply to cholecystostomy drain site only. 11/27/15  Yes Diego F Pabon, MD  amiodarone (PACERONE) 200 MG tablet Take 1 tablet (200 mg total) by mouth 2 (two) times daily. 10/19/15   Wayne E Gold, PA-C  oxyCODONE (OXY IR/ROXICODONE) 5 MG immediate release tablet Take 1-2 tablets (5-10 mg total) by mouth every 4 (four) hours as needed for severe pain. 11/07/15   Ivin Poot, MD      VITAL SIGNS:  Blood pressure 93/46, pulse 78, temperature 97.4 F (36.3 C), temperature source Oral, resp. rate 19, height 6\' 2"  (1.88 m), weight 71.668 kg (158 lb), SpO2 95 %.  PHYSICAL EXAMINATION:  Physical Exam  GENERAL:  75 y.o.-year-old patient lying in the bed in no acute distress.  EYES: Pupils equal, round, reactive to light and accommodation. No scleral icterus. Extraocular muscles intact.  HEENT: Head atraumatic,  normocephalic. Oropharynx and nasopharynx clear. No oropharyngeal erythema, moist oral mucosa  NECK:  Supple, no jugular venous distention. No thyroid enlargement, no tenderness.  LUNGS: Normal breath sounds bilaterally, no wheezing, rales, rhonchi. No use of accessory muscles of respiration.  CARDIOVASCULAR: S1, S2 RRR. No murmurs, rubs, gallops, clicks.  ABDOMEN: Soft, nontender, nondistended. Bowel sounds present. No organomegaly or mass. Status post right sided percutaneous cholecystotomy drain. No obvious erythema or induration noted near the site. EXTREMITIES: No pedal edema, cyanosis, or clubbing. + 2 pedal & radial pulses b/l.   NEUROLOGIC: Cranial nerves II through XII are intact. No focal Motor or sensory deficits appreciated b/l.  Globally weak PSYCHIATRIC: The patient is alert and oriented x 3. Good affect.  SKIN: No obvious rash, lesion, or ulcer.   LABORATORY PANEL:   CBC  Recent Labs Lab 12/03/15 1759  WBC 8.2  HGB 9.9*  HCT 28.7*  PLT 198   ------------------------------------------------------------------------------------------------------------------  Chemistries   Recent Labs Lab 12/03/15 1759  NA 130*  K 4.7  CL 98*  CO2 25  GLUCOSE 99  BUN 35*  CREATININE 2.89*  CALCIUM 9.2  AST 17  ALT 13*  ALKPHOS 95  BILITOT 0.8   ------------------------------------------------------------------------------------------------------------------  Cardiac Enzymes  Recent Labs Lab 12/03/15 1759  TROPONINI 0.03   ------------------------------------------------------------------------------------------------------------------  RADIOLOGY:  Dg Chest 2 View  12/03/2015  CLINICAL DATA:  Triple bypass surgery 1 month ago at [facility]. Biliary drain. EXAM: CHEST  2 VIEW COMPARISON:  11/16/2015 FINDINGS: Normal heart size. Prior median sternotomy and CABG procedure. There are small bilateral pleural effusions noted right greater in left. No interstitial edema,  airspace consolidation or pneumothorax identified. Right upper quadrant percutaneous biliary drainage catheter is identified. IMPRESSION: 1. Small bilateral pleural effusions. Electronically Signed   By: Kerby Moors M.D.   On: 12/03/2015 17:28   US Abdomen Limited Ruq  12/03/2015  CLINICAL DATA:  Patient with right upper quadrant pain for 2 days. History biliary drain. EXAM: US ABDOMEN LIMITED - RIGHT UPPER QUADRANT COMPARISON:  Right upper quadrant ultrasound 10/31/2015 FINDINGS: Gallbladder: The gallbladder is poorly visualized. There is suggestion of sludge within the gallbladder lumen. No gallbladder wall thickening. Negative sonographic Murphy's sign. Common bile duct: Diameter: 3 mm Liver: Diffusely increased in echogenicity.  No focal lesion is identified. Note is made of a right pleural effusion. IMPRESSION: The gallbladder is not well visualized and therefore poorly assessed. There is suggestion of sludge within the gallbladder lumen. Common bile duct measures 3 mm, nondilated. Electronically Signed   By: Lovey Newcomer M.D.   On: 12/03/2015 18:03     IMPRESSION AND PLAN:   75 year old male with past medical history of hypertension, hyperlipidemia, recent non-ST elevation MI status post cardiac catheter showing three-vessel coronary disease and patient is status post coronary artery bypass graft surgery, who presents to the hospital due to right sided abdominal pain near the drainage site and also noted to be in acute renal failure.  1. Acute renal failure-etiology unclear but suspected to be ATN secondary to hypotension and dehydration. -We'll aggressively hydrate the patient with IV fluids and follow BUN/creatinine. Renal dose meds in the form of nephrotoxins. -Hold antihypertensives for now.  2. Hypotension-etiology unclear. Dehydration versus possible sepsis. Source unclear. -Patient's abdominal ultrasound does show gallbladder wall sludge questionable of this is cholecystitis. His LFTs  are stable although and patient was seen by general surgery who did not think that the patient has any acute surgical indication presently. -Continue IV fluids, I will empirically place the patient on Zosyn. Follow hemodynamics.  3. History of coronary disease status post bypass.-No acute chest pain or evidence of CHF. -Continue aspirin, statin. Hold antihypertensives given the relative hypotension.  4. GERD-continue Protonix.  5. Depression-continue Remeron.   All the records are reviewed and case discussed with ED provider. Management plans discussed with the patient, family and they are in agreement.  CODE STATUS: Full  TOTAL TIME TAKING CARE OF THIS PATIENT: 45 minutes.    Henreitta Leber M.D on 12/03/2015 at 7:58 PM  Between 7am to 6pm - Pager - [phone]  After 6pm go to [email or web address] - password [email or web address] [facility]  Office  [phone]  CC: Primary care physician; Pcp Not In System

## 2015-12-04 DIAGNOSIS — N390 Urinary tract infection, site not specified: Secondary | ICD-10-CM | POA: Insufficient documentation

## 2015-12-04 DIAGNOSIS — N170 Acute kidney failure with tubular necrosis: Principal | ICD-10-CM

## 2015-12-04 LAB — COMPREHENSIVE METABOLIC PANEL
ALBUMIN: 2.6 g/dL — AB (ref 3.5–5.0)
ALK PHOS: 61 U/L (ref 38–126)
ALT: 10 U/L — ABNORMAL LOW (ref 17–63)
AST: 11 U/L — AB (ref 15–41)
Anion gap: 0 — ABNORMAL LOW (ref 5–15)
BILIRUBIN TOTAL: 0.7 mg/dL (ref 0.3–1.2)
BUN: 25 mg/dL — AB (ref 6–20)
CO2: 23 mmol/L (ref 22–32)
Calcium: 7.7 mg/dL — ABNORMAL LOW (ref 8.9–10.3)
Chloride: 112 mmol/L — ABNORMAL HIGH (ref 101–111)
Creatinine, Ser: 1.53 mg/dL — ABNORMAL HIGH (ref 0.61–1.24)
GFR calc Af Amer: 50 mL/min — ABNORMAL LOW (ref >60)
GFR calc non Af Amer: 43 mL/min — ABNORMAL LOW (ref >60)
GLUCOSE: 96 mg/dL (ref 65–99)
POTASSIUM: 4.3 mmol/L (ref 3.5–5.1)
Sodium: 135 mmol/L (ref 135–145)
TOTAL PROTEIN: 6 g/dL — AB (ref 6.5–8.1)

## 2015-12-04 LAB — CBC
HCT: 23.5 % — ABNORMAL LOW (ref 40.0–52.0)
HEMOGLOBIN: 8 g/dL — AB (ref 13.0–18.0)
MCH: 32.8 pg (ref 26.0–34.0)
MCHC: 34 g/dL (ref 32.0–36.0)
MCV: 96.4 fL (ref 80.0–100.0)
Platelets: 126 10*3/uL — ABNORMAL LOW (ref 150–440)
RBC: 2.44 MIL/uL — ABNORMAL LOW (ref 4.40–5.90)
RDW: 18.7 % — ABNORMAL HIGH (ref 11.5–14.5)
WBC: 4.4 10*3/uL (ref 3.8–10.6)

## 2015-12-04 LAB — GLUCOSE, CAPILLARY: GLUCOSE-CAPILLARY: 69 mg/dL (ref 65–99)

## 2015-12-04 MED ORDER — AMIODARONE HCL 200 MG PO TABS
200.0000 mg | ORAL_TABLET | Freq: Every day | ORAL | Status: DC
  Administered 2015-12-04 – 2015-12-05 (×2): 200 mg via ORAL
  Filled 2015-12-04 (×2): qty 1

## 2015-12-04 MED ORDER — ENSURE ENLIVE PO LIQD
237.0000 mL | Freq: Three times a day (TID) | ORAL | Status: DC
  Administered 2015-12-04 – 2015-12-05 (×4): 237 mL via ORAL

## 2015-12-04 MED ORDER — ENOXAPARIN SODIUM 40 MG/0.4ML ~~LOC~~ SOLN
40.0000 mg | SUBCUTANEOUS | Status: DC
  Administered 2015-12-04 – 2015-12-05 (×2): 40 mg via SUBCUTANEOUS
  Filled 2015-12-04 (×2): qty 0.4

## 2015-12-04 NOTE — Progress Notes (Signed)
75 year old male who has a history of acute cholecystitis with cholecystostomy tube that was placed 2 months ago at the same time as his three-vessel CABG. Patient has been monitored and awaiting the six-month mark prior to deciding on operative intervention. Patient had 2-3 day history of right flank pain and nausea and vomiting. Patient states feeling much better this morning denies any pain at this time. Patient states that he has not had any dysuria hematuria or foul-smelling urine but does endorse that it is hard for him to start a stream and sometimes feels as if he doesn't get all urine out.  Filed Vitals:   12/04/15 0849 12/04/15 0900  BP: 103/56 116/51  Pulse: 101 97  Temp: 98.3 F (36.8 C)   Resp: 21 16   I/O last 3 completed shifts: In: 896.7 [I.V.:846.7; IV Piggyback:50] Out: [id number] [Urine:600; Drains:570] Total I/O In: 200 [I.V.:200] Out: 550 [Urine:550]   PE:  Gen NAD Cardio: RRR, no murmur Res: CTAb/L Abd: soft, non-tender, well healed incision sites, biliary drain in lateral RUQ, some minimal irritation around, no cellulitis, minimal tenderness with tube manipulation Ext: 2+ pulses, no edema  CBC Latest Ref Rng 12/04/2015 12/03/2015 10/31/2015  WBC 3.8 - 10.6 K/uL 4.4 8.2 9.7  Hemoglobin 13.0 - 18.0 g/dL 8.0(L) 9.9(L) 11.9(L)  Hematocrit 40.0 - 52.0 % 23.5(L) 28.7(L) 34.2(L)  Platelets 150 - 440 K/uL 126(L) 198 308   CMP Latest Ref Rng 12/04/2015 12/03/2015 10/31/2015  Glucose 65 - 99 mg/dL 96 99 91  BUN 6 - 20 mg/dL 25(H) 35(H) 15  Creatinine 0.61 - 1.24 mg/dL 1.53(H) 2.89(H) 0.96  Sodium 135 - 145 mmol/L 135 130(L) 134(L)  Potassium 3.5 - 5.1 mmol/L 4.3 4.7 4.4  Chloride 101 - 111 mmol/L 112(H) 98(L) 99(L)  CO2 22 - 32 mmol/L 23 25 22   Calcium 8.9 - 10.3 mg/dL 7.7(L) 9.2 9.4  Total Protein 6.5 - 8.1 g/dL 6.0(L) 8.4(H) 8.7(H)  Total Bilirubin 0.3 - 1.2 mg/dL 0.7 0.8 0.3  Alkaline Phos 38 - 126 U/L 61 95 107  AST 15 - 41 U/L 11(L) 17 59(H)  ALT 17 - 63 U/L 10(L) 13(L)  87(H)    UA: Amber, hazy, 1+ LE, Nitrate neg, Bacteria rare, mucous and hyaline cast present   A/P:  75 year old with acute renal failure nausea and vomiting, dehydration  Cholecystostomy tube: Continued to drainage to biliary tube, attempting to get new StatLock placed today as peeling up off of the skin. LFTs remain normal and ultrasound does not show thickening or fluid around the gallbladder.  Would not recommend Cholecystectomy until about 6 months out from CABG  Acute renal failure: rehydration with IV fluids, UA with some LE and bacteria along with an enlarged prostate may have a component of urinary retention and UTI causing symptoms, improvement of Cr. With hydration.

## 2015-12-04 NOTE — Progress Notes (Signed)
Patient moved to room 209 by wheelchair with RN.  RN notified daughter by phone that patient was moved to new room. Off unit tele monitor applied to patient and verified with CCMD prior to leaving ICU.

## 2015-12-04 NOTE — Progress Notes (Signed)
[facility] [place] NAME: Preston Bell    MR#:  [id number]  DATE OF BIRTH:  May 13, 1941  SUBJECTIVE:  CHIEF COMPLAINT:  Hypotension  Hypotension improving. No vomiting/abdominal pain. Afebrile  REVIEW OF SYSTEMS:    Review of Systems  Constitutional: Positive for malaise/fatigue. Negative for fever and chills.  HENT: Negative for sore throat.   Eyes: Negative for blurred vision, double vision and pain.  Respiratory: Negative for cough, hemoptysis, shortness of breath and wheezing.   Cardiovascular: Negative for chest pain, palpitations, orthopnea and leg swelling.  Gastrointestinal: Positive for nausea and abdominal pain. Negative for heartburn, vomiting, diarrhea and constipation.  Genitourinary: Negative for dysuria and hematuria.  Musculoskeletal: Negative for back pain and joint pain.  Skin: Negative for rash.  Neurological: Positive for weakness. Negative for sensory change, speech change, focal weakness and headaches.  Endo/Heme/Allergies: Does not bruise/bleed easily.  Psychiatric/Behavioral: Negative for depression. The patient is not nervous/anxious.     DRUG ALLERGIES:  No Known Allergies  VITALS:  Blood pressure 103/52, pulse 106, temperature 98.5 F (36.9 C), temperature source Oral, resp. rate 20, height 6\' 2"  (1.88 m), weight 75.5 kg (166 lb 7.2 oz), SpO2 94 %.  PHYSICAL EXAMINATION:   Physical Exam  GENERAL:  75 y.o.-year-old patient lying in the bed with no acute distress.  EYES: Pupils equal, round, reactive to light and accommodation. No scleral icterus. Extraocular muscles intact.  HEENT: Head atraumatic, normocephalic. Oropharynx and nasopharynx clear.  NECK:  Supple, no jugular venous distention. No thyroid enlargement, no tenderness.  LUNGS: Normal breath sounds bilaterally, no wheezing, rales, rhonchi. No use of accessory muscles of respiration.  CARDIOVASCULAR: S1, S2 normal. No murmurs, rubs,  or gallops.  ABDOMEN: Soft, nontender, nondistended. Bowel sounds present. No organomegaly or mass.  Cholecystostomy tube in place EXTREMITIES: No cyanosis, clubbing or edema b/l.    NEUROLOGIC: Cranial nerves II through XII are intact. No focal Motor or sensory deficits b/l.   PSYCHIATRIC: The patient is alert and awake. SKIN: No obvious rash, lesion, or ulcer.   LABORATORY PANEL:   CBC  Recent Labs Lab 12/04/15 0436  WBC 4.4  HGB 8.0*  HCT 23.5*  PLT 126*   ------------------------------------------------------------------------------------------------------------------ Chemistries   Recent Labs Lab 12/04/15 0436  NA 135  K 4.3  CL 112*  CO2 23  GLUCOSE 96  BUN 25*  CREATININE 1.53*  CALCIUM 7.7*  AST 11*  ALT 10*  ALKPHOS 61  BILITOT 0.7   ------------------------------------------------------------------------------------------------------------------  Cardiac Enzymes  Recent Labs Lab 12/03/15 1759  TROPONINI 0.03   ------------------------------------------------------------------------------------------------------------------  RADIOLOGY:  Dg Chest 2 View  12/03/2015  CLINICAL DATA:  Triple bypass surgery 1 month ago at [facility]. Biliary drain. EXAM: CHEST  2 VIEW COMPARISON:  11/16/2015 FINDINGS: Normal heart size. Prior median sternotomy and CABG procedure. There are small bilateral pleural effusions noted right greater in left. No interstitial edema, airspace consolidation or pneumothorax identified. Right upper quadrant percutaneous biliary drainage catheter is identified. IMPRESSION: 1. Small bilateral pleural effusions. Electronically Signed   By: Kerby Moors M.D.   On: 12/03/2015 17:28   US Abdomen Limited Ruq  12/03/2015  CLINICAL DATA:  Patient with right upper quadrant pain for 2 days. History biliary drain. EXAM: US ABDOMEN LIMITED - RIGHT UPPER QUADRANT COMPARISON:  Right upper quadrant ultrasound 10/31/2015 FINDINGS: Gallbladder: The  gallbladder is poorly visualized. There is suggestion of sludge within the gallbladder lumen. No gallbladder wall thickening. Negative sonographic Murphy's  sign. Common bile duct: Diameter: 3 mm Liver: Diffusely increased in echogenicity.  No focal lesion is identified. Note is made of a right pleural effusion. IMPRESSION: The gallbladder is not well visualized and therefore poorly assessed. There is suggestion of sludge within the gallbladder lumen. Common bile duct measures 3 mm, nondilated. Electronically Signed   By: Lovey Newcomer M.D.   On: 12/03/2015 18:03     ASSESSMENT AND PLAN:   75 year old male with past medical history of hypertension, hyperlipidemia, recent non-ST elevation MI status post cardiac catheter showing three-vessel coronary disease and patient is status post coronary artery bypass graft surgery, who presented to the hospital due to right sided abdominal pain near the drainage site and also noted to be in acute renal failure.  # Acute renal failure-likely due to ATN Improving with IV fluids and resolution of hypotension. Continue IV fluids. Monitor input and output.  # Hypotension-theology likely dehydration versus sepsis Patient presently on IV antibiotics and blood and urine cultures pending. Continue IV antibiotics 1 more day. Can stop if cultures negative tomorrow. Hypertension improving.  # History of coronary disease status post bypass.-No acute chest pain -Continue aspirin, statin.  Hold antihypertensives  # GERD-continue Protonix.  # Depression-continue Remeron.  # History of acute cholecystitis status post percutaneous cholecystostomy tube Follow-up with surgery  All the records are reviewed and case discussed with Care Management/Social Workerr. Management plans discussed with the patient, family and they are in agreement.  CODE STATUS: Full code  DVT Prophylaxis: SCDs  TOTAL TIME TAKING CARE OF THIS PATIENT:35 minutes.   POSSIBLE D/C IN 1-2 DAYS,  DEPENDING ON CLINICAL CONDITION.  [name] M.D on 12/04/2015 at 1:42 PM  Between 7am to 6pm - Pager - [phone]  After 6pm go to [email or web address] - password [email or web address] [facility]  Office  [phone]  CC: Primary care physician; Pcp Not In System  Note: This dictation was prepared with Dragon dictation along with smaller phrase technology. Any transcriptional errors that result from this process are unintentional.

## 2015-12-04 NOTE — Progress Notes (Signed)
[facility]  Patient Status: Active  [facility] is providing the following services: SN  If patient discharges after hours, please call [phone].   Preston Bell 12/04/2015, 10:33 AM

## 2015-12-04 NOTE — Progress Notes (Signed)
Report called to Pryor Montes, RN on 2C.  Patient has been A&Ox4.  No complaint of pain and denies nausea. Stach per cardiac monitor. VSS.  Tolerating diet with fair appetite. Daughter visited early afternoon. Will move patient to room 209.

## 2015-12-04 NOTE — Progress Notes (Signed)
Pt came to floor at 1830. VSS. Pt and daughter were oriented to room and safety plan. Bed alarm on and urinal at bedside.

## 2015-12-04 NOTE — Progress Notes (Signed)
Pharmacy Antibiotic Note  Preston Bell is a 75 y.o. male admitted on 12/03/2015 with sepsis.  Pharmacy has been consulted for piperacillin/tazobactam dosing.  Plan: Zosyn 3.375g IV q8h (4 hour infusion).  Height: 6\' 2"  (188 cm) Weight: 166 lb 7.2 oz (75.5 kg) IBW/kg (Calculated) : 82.2  Temp (24hrs), Avg:98.1 F (36.7 C), Min:97.4 F (36.3 C), Max:98.5 F (36.9 C)   Recent Labs Lab 12/03/15 1759 12/03/15 1928 12/03/15 2258 12/04/15 0436  WBC 8.2  --   --  4.4  CREATININE 2.89*  --   --  1.53*  LATICACIDVEN  --  2.2* 2.2*  --     Estimated Creatinine Clearance: 45.2 mL/min (by C-G formula based on Cr of 1.53).    No Known Allergies  Antimicrobials this admission: Piperacillin/tazobactam 4/9 >>   Microbiology results: 4/9 BCx: NG < 24 hr 4/9 UCx: to young to read   Pharmacy will continue to monitor and adjust per consult.    Preston Bell Clinical Pharmacist 12/04/2015 3:29 PM

## 2015-12-04 NOTE — Care Management (Signed)
Patient is currently open to [facility].  He is admitted with abdominal pain and hypotension.  Found to be in a acute renal failure most likely from ATN due to hypotension.  Symptoms are resolving.  Patient is for gallbladder surgery at the end of the month

## 2015-12-04 NOTE — Progress Notes (Signed)
Nutrition Follow-up  DOCUMENTATION CODES:   Not applicable  INTERVENTION:   Medical Food Supplement Therapy: recommend Ensure Enlive po TID, each supplement provides 350 kcal and 20 grams of protein Meals and Snacks: Cater to patient preferences  NUTRITION DIAGNOSIS:   Inadequate oral intake related to poor appetite as evidenced by per patient/family report.  GOAL:   Patient will meet greater than or equal to 90% of their needs  MONITOR:   PO intake, Supplement acceptance, Weight trends  REASON FOR ASSESSMENT:   Malnutrition Screening Tool    ASSESSMENT:   Pt admitted with 2-3 day right flank pain with N/V, AKI pt with hx of acute cholecystitis with percutaneous cholecystostomy tube placed and 3-vessel CABG performed 2 months ago with plans for cholecystectomy in the future.  Past Medical History  Diagnosis Date  . Cancer ([place])   . History of colectomy   . Hypertension   . GERD (gastroesophageal reflux disease)   . HLD (hyperlipidemia)   . NSTEMI (non-ST elevated myocardial infarction) ([place])   . 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 Acute diastolic heart failure ([facility]) - EDP severely elevated @ Cath post NSTEMI 10/11/2015     Diet Order:  Diet Heart Room service appropriate?: Yes; Fluid consistency:: Thin   Energy Intake: pt reports poor appetite at present, did not want breakfast this AM  Food and Nutrition Related History: pt reports poor appetite for a few days prior to admission but prior to this pt eating well. Reports eating 3 meals per day and supplementing with Ensure 3-4 times per day  Skin:  Reviewed, no issues  Last BM:  no documented BM   Labs:  reviewed  Meds: remeron, NS at 100 ml/hr  Nutrition Focused Physical Exam: Nutrition-Focused physical exam completed. Findings are WDL for fat depletion, muscle depletion, and edema.    Height:   Ht Readings from Last 1 Encounters:  12/03/15 6\' 2"  (1.88  m)    Weight: pt reports wt has recently been stable; reports he weighed 158 pounds at his recent MD visit  Wt Readings from Last 1 Encounters:  12/03/15 166 lb 7.2 oz (75.5 kg)    Ideal Body Weight:     BMI:  Body mass index is 21.36 kg/(m^2).  Estimated Nutritional Needs:   Kcal:  1900-2280 kcals (25-30 kcals/kg)   Protein:  76-91 g (1.0-1.2 g/kg)   Fluid:  >2 L per day  EDUCATION NEEDS:   No education needs identified at this time  [place], [place], [place] [phone] Pager  [phone] Weekend/On-Call Pager

## 2015-12-05 LAB — CBC WITH DIFFERENTIAL/PLATELET
Basophils Absolute: 0 10*3/uL (ref 0–0.1)
Basophils Relative: 0 %
EOS ABS: 0.1 10*3/uL (ref 0–0.7)
Eosinophils Relative: 3 %
HCT: 21.4 % — ABNORMAL LOW (ref 40.0–52.0)
HEMOGLOBIN: 7.5 g/dL — AB (ref 13.0–18.0)
LYMPHS ABS: 0.9 10*3/uL — AB (ref 1.0–3.6)
LYMPHS PCT: 25 %
MCH: 33 pg (ref 26.0–34.0)
MCHC: 34.7 g/dL (ref 32.0–36.0)
MCV: 95 fL (ref 80.0–100.0)
MONOS PCT: 13 %
Monocytes Absolute: 0.5 10*3/uL (ref 0.2–1.0)
NEUTROS PCT: 59 %
Neutro Abs: 2.2 10*3/uL (ref 1.4–6.5)
Platelets: 133 10*3/uL — ABNORMAL LOW (ref 150–440)
RBC: 2.26 MIL/uL — AB (ref 4.40–5.90)
RDW: 18 % — ABNORMAL HIGH (ref 11.5–14.5)
WBC: 3.8 10*3/uL (ref 3.8–10.6)

## 2015-12-05 LAB — COMPREHENSIVE METABOLIC PANEL
ALK PHOS: 82 U/L (ref 38–126)
ALT: 11 U/L — AB (ref 17–63)
ANION GAP: 4 — AB (ref 5–15)
AST: 13 U/L — ABNORMAL LOW (ref 15–41)
Albumin: 2.6 g/dL — ABNORMAL LOW (ref 3.5–5.0)
BILIRUBIN TOTAL: 0.7 mg/dL (ref 0.3–1.2)
BUN: 12 mg/dL (ref 6–20)
CALCIUM: 8.1 mg/dL — AB (ref 8.9–10.3)
CO2: 21 mmol/L — ABNORMAL LOW (ref 22–32)
CREATININE: 0.7 mg/dL (ref 0.61–1.24)
Chloride: 108 mmol/L (ref 101–111)
GFR calc non Af Amer: >60 mL/min (ref >60)
Glucose, Bld: 90 mg/dL (ref 65–99)
Potassium: 4 mmol/L (ref 3.5–5.1)
Sodium: 133 mmol/L — ABNORMAL LOW (ref 135–145)
TOTAL PROTEIN: 6.2 g/dL — AB (ref 6.5–8.1)

## 2015-12-05 LAB — URINE CULTURE

## 2015-12-05 LAB — RETICULOCYTES
RBC.: 2.3 MIL/uL — AB (ref 4.40–5.90)
RETIC COUNT ABSOLUTE: 78.2 10*3/uL (ref 19.0–183.0)
Retic Ct Pct: 3.4 % — ABNORMAL HIGH (ref 0.4–3.1)

## 2015-12-05 LAB — ABO/RH: ABO/RH(D): A POS

## 2015-12-05 LAB — IRON AND TIBC
IRON: 17 ug/dL — AB (ref 45–182)
Saturation Ratios: 9 % — ABNORMAL LOW (ref 17.9–39.5)
TIBC: 198 ug/dL — AB (ref 250–450)
UIBC: 181 ug/dL

## 2015-12-05 LAB — FOLATE: FOLATE: 16.2 ng/mL (ref >5.9)

## 2015-12-05 LAB — HEMOGLOBIN AND HEMATOCRIT, BLOOD
HEMATOCRIT: 26.5 % — AB (ref 40.0–52.0)
HEMOGLOBIN: 9.2 g/dL — AB (ref 13.0–18.0)

## 2015-12-05 LAB — VITAMIN B12: Vitamin B-12: 190 pg/mL (ref 180–914)

## 2015-12-05 LAB — FERRITIN: Ferritin: 914 ng/mL — ABNORMAL HIGH (ref 24–336)

## 2015-12-05 MED ORDER — ENSURE ENLIVE PO LIQD: 237.0000 mL | Freq: Three times a day (TID) | ORAL | Status: DC

## 2015-12-05 MED ORDER — MORPHINE SULFATE (PF) 4 MG/ML IV SOLN: 4.0000 mg | INTRAVENOUS | Status: DC | PRN

## 2015-12-05 MED ORDER — MORPHINE SULFATE (PF) 4 MG/ML IV SOLN
4.0000 mg | Freq: Once | INTRAVENOUS | Status: AC
  Administered 2015-12-06: 4 mg via INTRAVENOUS
  Filled 2015-12-05: qty 1

## 2015-12-05 MED ORDER — ACETAMINOPHEN 325 MG PO TABS
650.0000 mg | ORAL_TABLET | Freq: Once | ORAL | Status: AC
  Administered 2015-12-05: 650 mg via ORAL

## 2015-12-05 MED ORDER — FERROUS SULFATE 325 (65 FE) MG PO TABS: 325.0000 mg | ORAL_TABLET | Freq: Every day | ORAL | Status: DC

## 2015-12-05 MED ORDER — SODIUM CHLORIDE 0.9 % IV SOLN
Freq: Once | INTRAVENOUS | Status: AC
  Administered 2015-12-05: 10:00:00 via INTRAVENOUS

## 2015-12-05 MED ORDER — OXYCODONE-ACETAMINOPHEN 5-325 MG PO TABS
1.0000 | ORAL_TABLET | Freq: Four times a day (QID) | ORAL | Status: DC | PRN
  Administered 2015-12-05: 1 via ORAL
  Filled 2015-12-05: qty 1

## 2015-12-05 NOTE — Care Management Important Message (Signed)
Important Message  Patient Details  Name: LAMONTE ROSENCRANTZ MRN: [id number] Date of Birth: [birth year]   Medicare Important Message Given:  Yes    Juliann Pulse A Aries Kasa 12/05/2015, 10:22 AM

## 2015-12-05 NOTE — Care Management (Signed)
Plan for patient to discharge today if HBG improved post transfusion.   Resumption of care order has been placed for RN, PT has been added.  I have notified Corene Cornea from [facility] of discharge plan.  RNCM signing off.

## 2015-12-05 NOTE — Progress Notes (Signed)
Notified Dr. Marcille Blanco of pt hemoglobin 7.5 this morning, was 8 yesterday.  Pt is hemodynamically stable, asymtomatic.  Per Dr. Marcille Blanco he will place orders to type and screen, but no transfusion order at this time.

## 2015-12-05 NOTE — Progress Notes (Signed)
[facility] at [place] NAME: Preston Bell    MR#:  [id number]  DATE OF BIRTH:  11-Jun-1941  SUBJECTIVE:  No acute issues abdominal pain resolved  REVIEW OF SYSTEMS:    Review of Systems  Constitutional: Negative for fever, chills and malaise/fatigue.  HENT: Negative for ear discharge, ear pain, hearing loss, nosebleeds and sore throat.   Eyes: Negative for blurred vision and pain.  Respiratory: Negative for cough, hemoptysis, shortness of breath and wheezing.   Cardiovascular: Negative for chest pain, palpitations and leg swelling.  Gastrointestinal: Negative for nausea, vomiting, abdominal pain, diarrhea and blood in stool.  Genitourinary: Negative for dysuria.  Musculoskeletal: Negative for back pain.  Neurological: Negative for dizziness, tremors, speech change, focal weakness, seizures and headaches.  Endo/Heme/Allergies: Does not bruise/bleed easily.  Psychiatric/Behavioral: Negative for depression, suicidal ideas and hallucinations.    Tolerating Diet:yes      DRUG ALLERGIES:  No Known Allergies  VITALS:  Blood pressure 168/68, pulse 121, temperature 98.2 F (36.8 C), temperature source Oral, resp. rate 20, height 6\' 2"  (1.88 m), weight 75.5 kg (166 lb 7.2 oz), SpO2 97 %.  PHYSICAL EXAMINATION:   Physical Exam  Constitutional: He is oriented to person, place, and time and well-developed, well-nourished, and in no distress. No distress.  HENT:  Head: Normocephalic.  Eyes: No scleral icterus.  Neck: Normal range of motion. Neck supple. No JVD present. No tracheal deviation present.  Cardiovascular: Normal rate, regular rhythm and normal heart sounds.  Exam reveals no gallop and no friction rub.   No murmur heard. Pulmonary/Chest: Effort normal and breath sounds normal. No respiratory distress. He has no wheezes. He has no rales. He exhibits no tenderness.  Abdominal: Soft. Bowel sounds are normal. He exhibits no  distension and no mass. There is no tenderness. There is no rebound and no guarding.  PC drain draining fluid dark colored  Musculoskeletal: Normal range of motion. He exhibits no edema.  Neurological: He is alert and oriented to person, place, and time.  Skin: Skin is warm. No rash noted. No erythema.  Psychiatric: Affect and judgment normal.      LABORATORY PANEL:   CBC  Recent Labs Lab 12/05/15 0455  WBC 3.8  HGB 7.5*  HCT 21.4*  PLT 133*   ------------------------------------------------------------------------------------------------------------------  Chemistries   Recent Labs Lab 12/05/15 0455  NA 133*  K 4.0  CL 108  CO2 21*  GLUCOSE 90  BUN 12  CREATININE 0.70  CALCIUM 8.1*  AST 13*  ALT 11*  ALKPHOS 82  BILITOT 0.7   ------------------------------------------------------------------------------------------------------------------  Cardiac Enzymes  Recent Labs Lab 12/03/15 1759  TROPONINI 0.03   ------------------------------------------------------------------------------------------------------------------  RADIOLOGY:  No results found.   ASSESSMENT AND PLAN:  75 year old male with a history of GERD, coronary artery disease/CABG cholecystectomy with percutaneous cholecystectomy tube who presented with right flank pain and nausea and vomiting and found to be in acute renal failure. For further details please further H&P.   1. Acute renal failure: This is due to ATN due to hypotension. Creatinine improved with IV fluids.  2. Hypotension: Patient was ruled out for sepsis. He was empirically started on Zosyn and blood cultures are negative to date so subsequently antibiotics were discontinued.  This is due to dehydration.  3. CAD/CABG: Patient continue aspirin, statin, lisinopril and metoprolol.   4. Right upper quadrant flank pain: Patient was evaluated by surgical service. It was not felt that patient had an acute surgical  issue that  needed emergent intervention. He has a percutaneous cholecystectomy tube which is draining. Patient will follow-up with surgery in 10 days. Prior to cholecystectomy he will need to be off antiplatelet medication for at least 5-7 days.  5. Iron deficiency anemia: Anemia panels consistent with some iron deficiency. Patient was transfused 1 unit PRBCs due to recent history of CAD/CABG. Ferritin level was 900 and therefore patient does not need ferrous sulfate replacement. Will need to follow-up with his primary care physician regarding this. There was no evidence of GI bleed including hematochezia or melena.      Management plans discussed with the patient and he is in agreement.  CODE STATUS: full  TOTAL TIME TAKING CARE OF THIS PATIENT: 26 minutes.     POSSIBLE D/C today, DEPENDING ON CLINICAL CONDITION.   Rosey Eide M.D on 12/05/2015 at 9:08 PM  Between 7am to 6pm - Pager - [phone] After 6pm go to [email or web address] - password [email or web address] [facility]  Office  [phone]  CC: Primary care physician; Pcp Not In System  Note: This dictation was prepared with Dragon dictation along with smaller phrase technology. Any transcriptional errors that result from this process are unintentional.

## 2015-12-05 NOTE — Progress Notes (Signed)
CC: Abd pain, AKI Subjective: Feeling better, taking PO, pain improved. Got transfused. Creat better  Objective: Vital signs in last 24 hours: Temp:  [97.7 F (36.5 C)-98.6 F (37 C)] 98.6 F (37 C) (04/11 1845) Pulse Rate:  [99-125] 111 (04/11 1845) Resp:  [18-20] 18 (04/11 1845) BP: (112-156)/(49-68) 152/68 mmHg (04/11 1845) SpO2:  [95 %-96 %] 96 % (04/11 1845)    Intake/Output from previous day: 04/10 0701 - 04/11 0700 In: 2050 [P.O.:900; I.V.:1100; IV Piggyback:50] Out: [id number] [Urine:3325; Drains:100] Intake/Output this shift: Total I/O In: 468 [P.O.:120; I.V.:348] Out: 0   Physical exam: NAD alert Abd: soft, NT, cholecystostomy tube in place, no peritonitis Ext: no edema, well perfused Neuro: awake alert no motor or sensory deficits.  Lab Results: CBC   Recent Labs  12/04/15 0436 12/05/15 0455  WBC 4.4 3.8  HGB 8.0* 7.5*  HCT 23.5* 21.4*  PLT 126* 133*   BMET  Recent Labs  12/04/15 0436 12/05/15 0455  NA 135 133*  K 4.3 4.0  CL 112* 108  CO2 23 21*  GLUCOSE 96 90  BUN 25* 12  CREATININE 1.53* 0.70  CALCIUM 7.7* 8.1*   PT/INR No results for input(s): LABPROT, INR in the last 72 hours. ABG No results for input(s): PHART, HCO3 in the last 72 hours.  Invalid input(s): PCO2, PO2  Studies/Results: No results found.  Anti-infectives: Anti-infectives    Start     Dose/Rate Route Frequency Ordered Stop   12/04/15 0600  piperacillin-tazobactam (ZOSYN) IVPB 3.375 g  Status:  Discontinued     3.375 g 12.5 mL/hr over 240 Minutes Intravenous 3 times per day 12/03/15 2153 12/05/15 1355   12/03/15 2015  piperacillin-tazobactam (ZOSYN) IVPB 3.375 g     3.375 g 100 mL/hr over 30 Minutes Intravenous  Once 12/03/15 2001 12/03/15 2035   12/03/15 1930  ceFEPIme (MAXIPIME) 2 g in dextrose 5 % 50 mL IVPB  Status:  Discontinued     2 g 100 mL/hr over 30 Minutes Intravenous  Once 12/03/15 1901 12/03/15 1955   12/03/15 1915  cefTRIAXone (ROCEPHIN) 1 g in  dextrose 5 % 50 mL IVPB  Status:  Discontinued     1 g 100 mL/hr over 30 Minutes Intravenous  Once 12/03/15 1900 12/03/15 1901      Assessment/Plan: Doing well from GB perspective Keep cholecystostomy drain Cholecystectomy in 4 months ( 6 months after MI and CABG) NO surgical intervention We will sign off, rest of the plan per Hospitalist service. Caroleen Hamman, MD, [facility]  12/05/2015

## 2015-12-05 NOTE — Progress Notes (Signed)
Pt is alert and oriented, one complaint of abdominal pain relieved with percocet.  Sinus tachycardia, on room air.  Voids in urinal.  VSS, afebrile.

## 2015-12-05 NOTE — Progress Notes (Signed)
Pt complaining of 6/10 pain (constant ache) in right upper abdominal quadrant. Currently only tylenol ordered.  Notified Dr. Jannifer Franklin, and per Jannifer Franklin he will place order for percocet.

## 2015-12-05 NOTE — Discharge Summary (Addendum)
[facility] NAME: Preston Bell    MR#:  [id number]  DATE OF BIRTH:  10/04/1940  DATE OF ADMISSION:  12/03/2015 ADMITTING PHYSICIAN: Henreitta Leber, MD  DATE OF DISCHARGE:12/06/2015  PRIMARY CARE PHYSICIAN: Pcp Not In System    ADMISSION DIAGNOSIS:  RUQ pain [R10.11] UTI (lower urinary tract infection) [N39.0] Right flank pain [R10.9] Sepsis, due to unspecified organism ([place]) [A41.9] Acute renal failure, unspecified acute renal failure type ([place]) [N17.9]  DISCHARGE DIAGNOSIS:  Active Problems:   Acute renal failure (ARF) (HCC)   Right flank pain   RUQ pain   UTI (lower urinary tract infection)   SECONDARY DIAGNOSIS:   Past Medical History  Diagnosis Date  . Cancer ([place])   . History of colectomy   . Hypertension   . GERD (gastroesophageal reflux disease)   . HLD (hyperlipidemia)   . NSTEMI (non-ST elevated myocardial infarction) ([place])   . 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 Acute diastolic heart failure (HCC) - EDP severely elevated @ Cath post NSTEMI 10/11/2015    HOSPITAL COURSE:   75 year old male with a history of GERD, coronary artery disease/CABG cholecystectomy with percutaneous cholecystectomy tube who presented with right flank pain and nausea and vomiting and found to be in acute renal failure. For further details please further H&P.   1. Acute renal failure: This is due to ATN due to hypotension. Creatinine improved with IV fluids.  2. Hypotension: Patient was ruled out for sepsis. He was empirically started on Zosyn and blood cultures are negative to date so subsequently antibiotics were discontinued.  This is due to dehydration.   3. CAD/CABG: Patient continue aspirin, statin, lisinopril and metoprolol.   4. Right upper quadrant flank pain: Patient was evaluated by surgical service. It was not felt that patient had an acute surgical issue that  needed emergent intervention. He has a percutaneous cholecystectomy tube which is draining. Patient will follow-up with surgery in 10 days. Prior to cholecystectomy he will need to be off antiplatelet medication for at least 5-7 days.  5. Iron deficiency anemia: Anemia panels consistent with some iron deficiency. Patient was transfused 1 unit PRBCs due to recent history of CAD/CABG. Hemoglobin was stable at 9.2 at discharge. Ferritin level was 900 and therefore patient does not need ferrous sulfate replacement. Will need to follow-up with his primary care physician regarding this. There was no evidence of GI bleed including hematochezia or melena.  DISCHARGE CONDITIONS AND DIET:   Stable for discharge on a heart healthy diet  CONSULTS OBTAINED:     DRUG ALLERGIES:  No Known Allergies  DISCHARGE MEDICATIONS:   Current Discharge Medication List    START taking these medications   Details  feeding supplement, ENSURE ENLIVE, (ENSURE ENLIVE) LIQD Take 237 mLs by mouth 3 (three) times daily between meals. Qty: 237 mL, Refills: 12      CONTINUE these medications which have NOT CHANGED   Details  aspirin EC 325 MG EC tablet Take 1 tablet (325 mg total) by mouth daily.    atorvastatin (LIPITOR) 20 MG tablet Take 1 tablet (20 mg total) by mouth every morning. Qty: 30 tablet, Refills: 1    esomeprazole (NEXIUM) 40 MG capsule Take 40 mg by mouth daily.    lisinopril (PRINIVIL,ZESTRIL) 10 MG tablet Take 1 tablet (10 mg total) by mouth every morning. Qty: 30 tablet, Refills: 1    metoprolol (  LOPRESSOR) 50 MG tablet Take 1 tablet (50 mg total) by mouth 2 (two) times daily. Qty: 60 tablet, Refills: 1    mirtazapine (REMERON) 15 MG tablet Take 15 mg by mouth at bedtime.    nystatin cream (MYCOSTATIN) Apply 1 application topically 2 (two) times daily. Apply to cholecystostomy drain site only. Qty: 30 g, Refills: 0    amiodarone (PACERONE) 200 MG tablet Take 1 tablet (200 mg total) by  mouth 2 (two) times daily. Qty: 60 tablet, Refills: 1    oxyCODONE (OXY IR/ROXICODONE) 5 MG immediate release tablet Take 1-2 tablets (5-10 mg total) by mouth every 4 (four) hours as needed for severe pain. Qty: 40 tablet, Refills: 0   Associated Diagnoses: Post-op pain              Today   CHIEF COMPLAINT:   patient doing well ready to go home. Was restarted on metoprololand amio this am  VITAL SIGNS:  Blood pressure 164/77, pulse 120, temperature 98.2 F (36.8 C), temperature source Oral, resp. rate 18, height 6\' 2"  (1.88 m), weight 75.5 kg (166 lb 7.2 oz), SpO2 98 %.   REVIEW OF SYSTEMS:  Review of Systems  Constitutional: Negative for fever, chills and malaise/fatigue.  HENT: Negative for ear discharge, ear pain, hearing loss, nosebleeds and sore throat.   Eyes: Negative for blurred vision and pain.  Respiratory: Negative for cough, hemoptysis, shortness of breath and wheezing.   Cardiovascular: Negative for chest pain, palpitations and leg swelling.  Gastrointestinal: Negative for nausea, vomiting, abdominal pain, diarrhea and blood in stool.  Genitourinary: Negative for dysuria.  Musculoskeletal: Negative for back pain.  Neurological: Negative for dizziness, tremors, speech change, focal weakness, seizures and headaches.  Endo/Heme/Allergies: Does not bruise/bleed easily.  Psychiatric/Behavioral: Negative for depression, suicidal ideas and hallucinations.     PHYSICAL EXAMINATION:  GENERAL:  75 y.o.-year-old patient lying in the bed with no acute distress.  NECK:  Supple, no jugular venous distention. No thyroid enlargement, no tenderness.  LUNGS: Normal breath sounds bilaterally, no wheezing, rales,rhonchi  No use of accessory muscles of respiration.  CARDIOVASCULAR: S1, S2 normal. No murmurs, rubs, or gallops.  ABDOMEN: Soft, non-tender, non-distended. Bowel sounds present. No organomegaly or mass.  cholecystectomy tube  EXTREMITIES: No pedal edema, cyanosis,  or clubbing.  PSYCHIATRIC: The patient is alert and oriented x 3.  SKIN: No obvious rash, lesion, or ulcer.   DATA REVIEW:   CBC  Recent Labs Lab 12/06/15 0510  WBC 4.5  HGB 9.2*  HCT 26.3*  PLT 151    Chemistries   Recent Labs Lab 12/05/15 0455  NA 133*  K 4.0  CL 108  CO2 21*  GLUCOSE 90  BUN 12  CREATININE 0.70  CALCIUM 8.1*  AST 13*  ALT 11*  ALKPHOS 82  BILITOT 0.7    Cardiac Enzymes  Recent Labs Lab 12/03/15 1759  TROPONINI 0.03    Microbiology Results  @MICRORSLT48 @  RADIOLOGY:  No results found.    Management plans discussed with the patient and he is in agreement. Stable for discharge home  Patient should follow up with PCP and surgery 7-10 days  CODE STATUS:     Code Status Orders        Start     Ordered   12/03/15 2154  Full code   Continuous     12/03/15 2153    Code Status History    Date Active Date Inactive Code Status Order ID Comments User Context  10/05/2015  5:40 PM 10/09/2015  2:52 PM Full Code [id number]  Clayburn Pert, MD Inpatient      TOTAL TIME TAKING CARE OF THIS PATIENT: 36 minutes.    Note: This dictation was prepared with Dragon dictation along with smaller phrase technology. Any transcriptional errors that result from this process are unintentional.  Jenefer Woerner M.D on 12/06/2015 at 8:03 AM  Between 7am to 6pm - Pager - [phone] After 6pm go to [email or web address] - password [email or web address] [facility]  Office  [phone]  CC: Primary care physician; Pcp Not In System

## 2015-12-06 LAB — CBC
HCT: 26.3 % — ABNORMAL LOW (ref 40.0–52.0)
HEMOGLOBIN: 9.2 g/dL — AB (ref 13.0–18.0)
MCH: 31.9 pg (ref 26.0–34.0)
MCHC: 34.9 g/dL (ref 32.0–36.0)
MCV: 91.4 fL (ref 80.0–100.0)
Platelets: 151 10*3/uL (ref 150–440)
RBC: 2.88 MIL/uL — ABNORMAL LOW (ref 4.40–5.90)
RDW: 17.1 % — AB (ref 11.5–14.5)
WBC: 4.5 10*3/uL (ref 3.8–10.6)

## 2015-12-06 MED ORDER — AMIODARONE HCL 200 MG PO TABS
200.0000 mg | ORAL_TABLET | Freq: Two times a day (BID) | ORAL | Status: DC
  Administered 2015-12-06: 200 mg via ORAL
  Filled 2015-12-06: qty 1

## 2015-12-06 MED ORDER — METOPROLOL TARTRATE 25 MG PO TABS
12.5000 mg | ORAL_TABLET | Freq: Once | ORAL | Status: AC
  Administered 2015-12-06: 12.5 mg via ORAL
  Filled 2015-12-06: qty 1

## 2015-12-06 MED ORDER — METOPROLOL TARTRATE 50 MG PO TABS
50.0000 mg | ORAL_TABLET | Freq: Two times a day (BID) | ORAL | Status: DC
  Administered 2015-12-06: 50 mg via ORAL
  Filled 2015-12-06: qty 1

## 2015-12-06 MED ORDER — LISINOPRIL 10 MG PO TABS
10.0000 mg | ORAL_TABLET | ORAL | Status: DC
  Administered 2015-12-06: 10 mg via ORAL
  Filled 2015-12-06: qty 1

## 2015-12-06 NOTE — Progress Notes (Signed)
Pt d/c to home today.  IV removed intact.  Pt will be d/c with cholecystotomy percutaneous drain in place.  Rx's given to pt w/all questions and concerns addressed.  D/C paperwork reviewed and education provided with all questions and concerns addressed.  Pt partner at bedside for home transport.  Volunteer services contact for transportation from room to exit.

## 2015-12-06 NOTE — Care Management (Signed)
for discharge today.  MD entered home health order for PT.  Patient needs order to resume SN. Contacted MD and requested SN be added to the home health order and the face to face.  Notified [facility]

## 2015-12-06 NOTE — Progress Notes (Signed)
Dr. Jannifer Franklin notified of pt elevated HR - 145-130s. Pt c/o pain also at tube site. Morphine 4mg  ordered and given to see if this would help with HR and pain. Pain improved HR still 130s. PO metoprolol ordered and given x 1, HR now low 110s (pt baseline per report from day shift nurse). Will continue to monitor.

## 2015-12-08 LAB — PREPARE RBC (CROSSMATCH)

## 2015-12-11 LAB — CULTURE, BLOOD (ROUTINE X 2)
Culture: NO GROWTH
Culture: NO GROWTH

## 2015-12-12 ENCOUNTER — Encounter: Payer: Self-pay | Admitting: General Surgery

## 2015-12-12 ENCOUNTER — Telehealth: Payer: Self-pay

## 2015-12-12 ENCOUNTER — Ambulatory Visit (INDEPENDENT_AMBULATORY_CARE_PROVIDER_SITE_OTHER): Payer: Medicare Other | Admitting: General Surgery

## 2015-12-12 ENCOUNTER — Other Ambulatory Visit: Payer: Self-pay

## 2015-12-12 VITALS — BP 123/70 | HR 96 | Temp 97.5°F | Ht 74.0 in | Wt 159.6 lb

## 2015-12-12 DIAGNOSIS — K819 Cholecystitis, unspecified: Secondary | ICD-10-CM | POA: Diagnosis not present

## 2015-12-12 DIAGNOSIS — I2510 Atherosclerotic heart disease of native coronary artery without angina pectoris: Secondary | ICD-10-CM

## 2015-12-12 DIAGNOSIS — Z01812 Encounter for preprocedural laboratory examination: Secondary | ICD-10-CM

## 2015-12-12 DIAGNOSIS — R109 Unspecified abdominal pain: Secondary | ICD-10-CM

## 2015-12-12 DIAGNOSIS — Z01818 Encounter for other preprocedural examination: Secondary | ICD-10-CM

## 2015-12-12 MED ORDER — OXYCODONE-ACETAMINOPHEN 5-325 MG PO TABS: 1.0000 | ORAL_TABLET | Freq: Four times a day (QID) | ORAL | Status: DC | PRN

## 2015-12-12 NOTE — Progress Notes (Signed)
Outpatient Surgical Follow Up  12/12/2015  Preston Bell is an 75 y.o. male.   Chief Complaint  Patient presents with  . Follow-up    biliary Tube    HPI: 75 year old male returns to clinic for continued follow-up of biliary drainage. He is 2-1/2 months status post placement of a cholecystostomy drain and he had a three-vessel CABG shortly thereafter. He continues to have discomfort at the tube insertion site that is preventing him from sleeping. Per his daughter he is having approximately 500 mL of output every day from the tube. It remains bilious. He has been tolerating a diet without nausea or vomiting and having regular bowel function. His only complaint of pain is at tube insertion site remainder of his abdomen is without discomfort.  Past Medical History  Diagnosis Date  . History of colectomy   . Hypertension   . GERD (gastroesophageal reflux disease)   . HLD (hyperlipidemia)   . 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 Acute diastolic heart failure (HCC) - EDP severely elevated @ Cath post NSTEMI 10/11/2015  . Anxiety   . Panic disorder   . Cancer ([place])     Lung and Colon  . NSTEMI (non-ST elevated myocardial infarction) ([place])   . CHF (congestive heart failure) ([place])   . SVT (supraventricular tachycardia) ([place])   . Pleural effusion     Right Lung  . Chronic kidney disease     Acute Renal Failure  . Sepsis ([place])   . Anemia   . Gallstone pancreatitis   . Hypercalcemia   . Alcohol abuse     Past Surgical History  Procedure Laterality Date  . Colostomy reversal    . Cardiac catheterization N/A 10/09/2015    Procedure: Left Heart Cath and Coronary Angiography;  Surgeon: Wellington Hampshire, MD;  Location: [facility];  Service: Cardiovascular;  Laterality: N/A;  . Coronary artery bypass graft N/A 10/13/2015    Procedure: CORONARY ARTERY BYPASS GRAFTING (CABG), ON PUMP, TIMES THREE, USING LEFT INTERNAL MAMMARY ARTERY,  RIGHT GREATER SAPHENOUS VEIN HARVESTED ENDOSCOPICALLY;  Surgeon: Ivin Poot, MD;  Location: [facility];  Service: Open Heart Surgery;  Laterality: N/A;  LIMA-LAD; SVG-OM; SVG-RCA  . Tee without cardioversion N/A 10/13/2015    Procedure: TRANSESOPHAGEAL ECHOCARDIOGRAM (TEE);  Surgeon: Ivin Poot, MD;  Location: [facility];  Service: Open Heart Surgery;  Laterality: N/A;  . Thoracentesis    . Coronary artery bypass graft      x 3 Vessels    Family History  Problem Relation Age of Onset  . Cervical cancer Mother   . Lung cancer Father   . Lung cancer Brother   . Hypertension Brother   . Hypertension Son     Social History:  reports that he quit smoking about 32 years ago. He has never used smokeless tobacco. He reports that he uses illicit drugs (Marijuana). He reports that he does not drink alcohol.  Allergies: No Known Allergies  Medications reviewed.    ROS A multipoint review of systems was completed. All pertinent positives and negatives Within the history of present illness and remainder are negative.   There were no vitals taken for this visit.  Physical Exam  Gen.: No acute distress Chest: Clear to auscultation, cholecystostomy tube in place going through the right lower chest draining a bilious fluid is no spreading erythema or purulence Heart: Regular rate and rhythm Abdomen: Soft, nontender, nondistended  No results found for this or any previous visit (from the past 48 hour(s)). No results found.  Assessment/Plan:  1. Cholecystitis 75 year old male with a cholecystostomy tube that was placed over 2 months ago for cholecystitis. Continues to have high volume output. Plan to obtain cholangiogram as an outpatient from the follow-up afterwards for results. We'll need to continue to wait approximately 6 months after his coronary artery bypass grafting before he would be a significant operative risk for cholecystectomy. We will also provide with a short-term of oral  pain medications today to assist in the process. He'll follow up in clinic after the cholangiogram.     Clayburn Pert, MD [facility] General Surgeon  12/12/2015,1:54 PM

## 2015-12-12 NOTE — Telephone Encounter (Signed)
Follow up on Cholangiogram appt as soon as possible. Information was faxed to [facility] on 12/12/15 @3 :55 pm.  I spoke with Amy patients daughter and provided Cholangiogram appointment on Friday 12/15/15, arriving @ 12:00 and procedure will be @ 1:00 pm.  NPO 8 hours prior to test. Patient may take blood pressure, heart medicine with a sip of water only.  Patient was also instructed to go to [facility] lab the day before the procedure to have lab work done.  We will call patient with test results.

## 2015-12-12 NOTE — Patient Instructions (Addendum)
We are going to order a test today to check the drain. We will call you with the results and discuss what the next step will be. We will call you later today for the appointment time and date.

## 2015-12-13 LAB — TYPE AND SCREEN
ABO/RH(D): A POS
Antibody Screen: POSITIVE
Unit division: 0
Unit division: 0

## 2015-12-14 ENCOUNTER — Other Ambulatory Visit
Admission: RE | Admit: 2015-12-14 | Discharge: 2015-12-14 | Disposition: A | Discharge status: Home / Self Care | Payer: Medicare Other | Source: Ambulatory Visit | Attending: Radiology | Admitting: Radiology

## 2015-12-14 ENCOUNTER — Other Ambulatory Visit: Payer: Self-pay | Admitting: Radiology

## 2015-12-14 DIAGNOSIS — R109 Unspecified abdominal pain: Secondary | ICD-10-CM | POA: Diagnosis not present

## 2015-12-14 DIAGNOSIS — Z01818 Encounter for other preprocedural examination: Secondary | ICD-10-CM | POA: Diagnosis present

## 2015-12-14 LAB — CBC WITH DIFFERENTIAL/PLATELET
BASOS ABS: 0 10*3/uL (ref 0–0.1)
BASOS PCT: 0 %
Eosinophils Absolute: 0.3 10*3/uL (ref 0–0.7)
Eosinophils Relative: 6 %
HEMATOCRIT: 28.3 % — AB (ref 40.0–52.0)
Hemoglobin: 9.6 g/dL — ABNORMAL LOW (ref 13.0–18.0)
Lymphocytes Relative: 20 %
Lymphs Abs: 1.1 10*3/uL (ref 1.0–3.6)
MCH: 31.3 pg (ref 26.0–34.0)
MCHC: 33.8 g/dL (ref 32.0–36.0)
MCV: 92.8 fL (ref 80.0–100.0)
MONO ABS: 0.6 10*3/uL (ref 0.2–1.0)
Monocytes Relative: 11 %
NEUTROS ABS: 3.6 10*3/uL (ref 1.4–6.5)
NEUTROS PCT: 63 %
Platelets: 313 10*3/uL (ref 150–440)
RBC: 3.05 MIL/uL — ABNORMAL LOW (ref 4.40–5.90)
RDW: 16.7 % — AB (ref 11.5–14.5)
WBC: 5.6 10*3/uL (ref 3.8–10.6)

## 2015-12-14 LAB — COMPREHENSIVE METABOLIC PANEL
ALT: 34 U/L (ref 17–63)
ANION GAP: 7 (ref 5–15)
AST: 22 U/L (ref 15–41)
Albumin: 3.2 g/dL — ABNORMAL LOW (ref 3.5–5.0)
Alkaline Phosphatase: 112 U/L (ref 38–126)
BUN: 10 mg/dL (ref 6–20)
CHLORIDE: 105 mmol/L (ref 101–111)
CO2: 25 mmol/L (ref 22–32)
Calcium: 9.3 mg/dL (ref 8.9–10.3)
Creatinine, Ser: 0.91 mg/dL (ref 0.61–1.24)
Glucose, Bld: 102 mg/dL — ABNORMAL HIGH (ref 65–99)
POTASSIUM: 4.4 mmol/L (ref 3.5–5.1)
Sodium: 137 mmol/L (ref 135–145)
Total Bilirubin: 0.4 mg/dL (ref 0.3–1.2)
Total Protein: 7.9 g/dL (ref 6.5–8.1)

## 2015-12-14 LAB — PROTIME-INR
INR: 1.23
Prothrombin Time: 15.7 seconds — ABNORMAL HIGH (ref 11.4–15.0)

## 2015-12-14 LAB — APTT: APTT: 37 s — AB (ref 24–36)

## 2015-12-15 ENCOUNTER — Ambulatory Visit
Admission: RE | Admit: 2015-12-15 | Discharge: 2015-12-15 | Disposition: A | Discharge status: Home / Self Care | Payer: Medicare Other | Source: Ambulatory Visit | Attending: General Surgery | Admitting: General Surgery

## 2015-12-15 DIAGNOSIS — K810 Acute cholecystitis: Secondary | ICD-10-CM | POA: Diagnosis present

## 2015-12-15 DIAGNOSIS — I252 Old myocardial infarction: Secondary | ICD-10-CM | POA: Diagnosis not present

## 2015-12-15 DIAGNOSIS — K819 Cholecystitis, unspecified: Secondary | ICD-10-CM

## 2015-12-15 MED ORDER — IOPAMIDOL (ISOVUE-300) INJECTION 61%
10.0000 mL | Freq: Once | INTRAVENOUS | Status: AC | PRN
  Administered 2015-12-15: 10 mL via INTRAVENOUS

## 2015-12-15 MED ORDER — SODIUM CHLORIDE 0.9 % IV SOLN: Freq: Once | INTRAVENOUS | Status: DC

## 2015-12-15 NOTE — OR Nursing (Signed)
13:00 Procedure completed and tube is clamped but if he develops discomfort he can connect back to clean drainage bag.  Radiologist plans to call ordering MD and schedule patient to come back next week to see about pulling the tube.

## 2015-12-15 NOTE — OR Nursing (Signed)
No IVs or labs needed per Dr Pascal Lux.

## 2015-12-15 NOTE — Procedures (Signed)
Percutaneous cholangiogram performed via existing cholecystostomy tube demonstrates 2 small nonocclusive gallstones within otherwise normal appearing gallbladder.   There is brisk passage of contrast through the cystic and common bile duct.   The common bile duct appears mildly dilated but without discrete filling defect to suggest choledocholithiasis.  The patient's cholecystostomy tube was capped for a trial of internalization.  The patient was given an extra gravity bag and instructed to reconnect the cholecystostomy tube to the gravity bag if he were to develop recurrent right upper quadrant abdominal pain/obstructive symptoms.  The patient will return to the interventional radiology department next week for repeat cholangiogram and potential percutaneous cholecystostomy tube removal.  This plan of care was discussed and agreed upon with referring surgeon, Dr. Adonis Huguenin.  Ronny Bacon, MD Pager #: [phone]

## 2015-12-18 ENCOUNTER — Other Ambulatory Visit: Payer: Self-pay | Admitting: Surgical

## 2015-12-19 ENCOUNTER — Telehealth: Payer: Self-pay | Admitting: General Surgery

## 2015-12-19 DIAGNOSIS — Z48812 Encounter for surgical aftercare following surgery on the circulatory system: Secondary | ICD-10-CM | POA: Diagnosis not present

## 2015-12-19 NOTE — Telephone Encounter (Signed)
Amber, please call Ebony Hail at the hospital to schedule patients cholangiogram with Dr Adonis Huguenin [phone]

## 2015-12-19 NOTE — Telephone Encounter (Signed)
Spoke with Dr. Adonis Huguenin regarding this patient. He states that their is nothing further to be done for this patient. Patient has had Cholangiogram and was capped last week. We will wait and see how patient does with drain capped off prior to doing anything further. Patient's family has been instructed to call if needed.  Call made to [facility] at this time and explained above information.

## 2015-12-26 NOTE — Progress Notes (Signed)
Have called Dr. Reginal Lutes office to ask about scheduling appt. For tube check/possible removal.  Awaiting MD orders.

## 2016-01-03 ENCOUNTER — Telehealth: Payer: Self-pay | Admitting: General Surgery

## 2016-01-03 NOTE — Telephone Encounter (Signed)
I spoke with Ruby and she denies patient is having any fever, redness, or pain. She was advised that there would be some drainage but unless the patient has fever >101, or redness or any pain,  that drainage is normal. She verbalized understanding and was told to call if she has any questions or concerns.

## 2016-01-03 NOTE — Telephone Encounter (Signed)
Patient had cholecystostomy tube that was placed over 3 months ago for cholecystitis. He will have cholecystectomy in future (per Dr Reginal Lutes notes, 6 months after his CABG grafting surgery) Patients daughter is calling because he has clear liquid coming out of the tube today and she states there is usually no fluid coming out. Please call and advise.

## 2016-02-06 ENCOUNTER — Telehealth: Payer: Self-pay

## 2016-02-06 ENCOUNTER — Telehealth: Payer: Self-pay | Admitting: General Surgery

## 2016-02-06 ENCOUNTER — Telehealth: Payer: Self-pay | Admitting: Cardiovascular Disease

## 2016-02-06 NOTE — Telephone Encounter (Signed)
Please call [facility] nurse, Mariel Kansky. Patient had a cholecystostomy tube that was placed about 4 months ago for cholecystitis. She would like to speak with nurse about the tube and also about locating some anchors for it. Please call and advise.

## 2016-02-06 NOTE — Telephone Encounter (Signed)
Spoke with [facility] Nurse) at this time. She would like to order Cath Secure for patient's drain as butterfly is coming off of skin and she cannot order anymore to replace it. Gave verbal order and asked that written order be faxed over. She verbalizes understanding of this.   She also would like to know when Cholecystectomy can be performed and drain removed. I explained that we must wait 6 months from date of CABG and have clearance prior to being able to perform Cholecysectomy.

## 2016-02-06 NOTE — Telephone Encounter (Signed)
Preston [facility] from [facility] calling  She states BP is running a bit low Around 90/60 92/50 Took medications and no symptoms  Just wanted to let us know  Please advise.

## 2016-02-06 NOTE — Telephone Encounter (Signed)
Returned phone call to nurse at this time. No answer and voicemail if currently full. Will speak with nurse when she returns phone call.

## 2016-02-06 NOTE — Telephone Encounter (Signed)
S/w Lattie Haw, [facility], who states when she visited patient on Friday, June 9, BP was 90/60 and 92/50. Reports pt was asymptomatic so she gave him his BP meds. States he takes metoprolol 50mg  BID and lisinopril 10mg  qd. He was given these meds Feb 23 w/one refill therefore, he should not have any at this time. She thinks BP meds are being refilled by PCP.  Lattie Haw also reports today of BP 92/50 on May 17. She feels pt is well enough to be d/c'd from [facility] but would like to see him "one more time next week to check his blood pressure". His daughter, Bertram Millard, lives with him and monitors his medications. Informed Lattie Haw I will f/u with pt regarding sx and medications.   Pt had CABG w/Dr. Prescott Gum Feb 2017. At March f/u w/Dr. Fletcher Anon, pt was taken to ED at [facility]. He then had f/u w/Dr. Prescott Gum March 29. He has not been seen by cardiology since.  I called pt/pt daughter at home number to try and get more information. No answer, no voice mail. Will call again.

## 2016-02-07 NOTE — Telephone Encounter (Signed)
Attempted to contact pt/pt daughter again regarding BP. No answer, no VM. Will call again

## 2016-02-08 NOTE — Telephone Encounter (Signed)
Attempted to contact pt. No answer, no vm.

## 2016-02-09 NOTE — Telephone Encounter (Signed)
After multiple phone calls to the patient, we are still unable top get in contact. Called Mariel Kansky, [facility] nurse, who originally called.  No answer, VM box is full. Letter mailed to patient to contact our office.

## 2016-02-28 ENCOUNTER — Ambulatory Visit (INDEPENDENT_AMBULATORY_CARE_PROVIDER_SITE_OTHER): Payer: Medicare Other | Admitting: Cardiothoracic Surgery

## 2016-02-28 ENCOUNTER — Encounter: Payer: Self-pay | Admitting: Cardiothoracic Surgery

## 2016-02-28 VITALS — BP 139/80 | HR 76 | Resp 20 | Ht 74.0 in | Wt 162.0 lb

## 2016-02-28 DIAGNOSIS — I2510 Atherosclerotic heart disease of native coronary artery without angina pectoris: Secondary | ICD-10-CM | POA: Diagnosis not present

## 2016-02-28 DIAGNOSIS — Z951 Presence of aortocoronary bypass graft: Secondary | ICD-10-CM

## 2016-02-28 DIAGNOSIS — K8010 Calculus of gallbladder with chronic cholecystitis without obstruction: Secondary | ICD-10-CM | POA: Diagnosis not present

## 2016-02-28 NOTE — Progress Notes (Signed)
PCP is Pcp Not In System Referring Provider is Leonie Man, MD  Chief Complaint  Patient presents with  . Routine Post Op    4 month f/u post op HX of CABG 10/13/15    Preston Bell returns for follow-up almost 6 months after urgent multivessel CABG for 90% left main stenosis and unstable angina. At that time the patient had cholecystitis-cholelithiasis and a cholecystostomy tube was placed. The patient recovered well from his multivessel CABG in February 2017. The patient has normal LV function. He has no recurrent angina or symptoms of CHF. His strength and activity tolerance have significantly improved. He has gained weight back which he lost during the illness. Surgical incisions are well-healed.  The patient's cholecystostomy tube remains in place. It has been capped off. He's had no ill effects from the tube being capped.  The patient is stable from a cardiac standpoint to have general anesthesia and cholecystectomy.  The patient also has symptomatic cataracts and is planning on having outpatient cataract surgery. The patient has stable cardiac function for cataract surgery as well.   Past Medical History  Diagnosis Date  . History of colectomy   . Hypertension   . GERD (gastroesophageal reflux disease)   . HLD (hyperlipidemia)   . 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 Acute diastolic heart failure (HCC) - EDP severely elevated @ Cath post NSTEMI 10/11/2015  . Anxiety   . Panic disorder   . Cancer ([place])     Lung and Colon  . NSTEMI (non-ST elevated myocardial infarction) ([place])   . CHF (congestive heart failure) ([place])   . SVT (supraventricular tachycardia) ([place])   . Pleural effusion     Right Lung  . Chronic kidney disease     Acute Renal Failure  . Sepsis ([place])   . Anemia   . Gallstone pancreatitis   . Hypercalcemia   . Alcohol abuse     Past Surgical History  Procedure Laterality Date  . Colostomy reversal  2013  .  Cardiac catheterization N/A 10/09/2015    Procedure: Left Heart Cath and Coronary Angiography;  Surgeon: Wellington Hampshire, MD;  Location: [place] [facility];  Service: Cardiovascular;  Laterality: N/A;  . Coronary artery bypass graft N/A 10/13/2015    Procedure: CORONARY ARTERY BYPASS GRAFTING (CABG), ON PUMP, TIMES THREE, USING LEFT INTERNAL MAMMARY ARTERY, RIGHT GREATER SAPHENOUS VEIN HARVESTED ENDOSCOPICALLY;  Surgeon: Ivin Poot, MD;  Location: [place];  Service: Open Heart Surgery;  Laterality: N/A;  LIMA-LAD; SVG-OM; SVG-RCA  . Tee without cardioversion N/A 10/13/2015    Procedure: TRANSESOPHAGEAL ECHOCARDIOGRAM (TEE);  Surgeon: Ivin Poot, MD;  Location: [place];  Service: Open Heart Surgery;  Laterality: N/A;  . Thoracentesis    . Coronary artery bypass graft      x 3 Vessels  . Colon surgery  2012    Colectomy [facility])  . Knee arthroscopy      Right-2011 left-2012    Family History  Problem Relation Age of Onset  . Cervical cancer Mother   . Lung cancer Father   . Lung cancer Brother   . Hypertension Brother   . Hypertension Son     Social History Social History  Substance Use Topics  . Smoking status: Former Smoker -- 1.00 packs/day for 30 years    Quit date: 10/06/1983  . Smokeless tobacco: Never Used  . Alcohol Use: No     Comment: Quit  consuming alcohol in 1988. Former Heavy User    Current Outpatient Prescriptions  Medication Sig Dispense Refill  . aspirin EC 325 MG EC tablet Take 1 tablet (325 mg total) by mouth daily.    Marland Kitchen atorvastatin (LIPITOR) 20 MG tablet Take 1 tablet (20 mg total) by mouth every morning. 30 tablet 1  . esomeprazole (NEXIUM) 40 MG capsule Take 40 mg by mouth daily.    . feeding supplement, ENSURE ENLIVE, (ENSURE ENLIVE) LIQD Take 237 mLs by mouth 3 (three) times daily between meals. 237 mL 12  . lisinopril (PRINIVIL,ZESTRIL) 10 MG tablet Take 1 tablet (10 mg total) by mouth every morning. 30 tablet 1  . metoprolol (LOPRESSOR) 50  MG tablet Take 1 tablet (50 mg total) by mouth 2 (two) times daily. 60 tablet 1  . mirtazapine (REMERON) 15 MG tablet Take 15 mg by mouth at bedtime.    Marland Kitchen nystatin cream (MYCOSTATIN) Apply 1 application topically 2 (two) times daily. Apply to cholecystostomy drain site only. 30 g 0  . oxyCODONE-acetaminophen (PERCOCET) 5-325 MG tablet Take 1 tablet by mouth every 6 (six) hours as needed for severe pain. 30 tablet 0   No current facility-administered medications for this visit.    No Known Allergies  Review of Systems  Weight slowly increasing No fever Symptoms of recent nasal-sinus congestion lately No sternal popping or clicking No angina or symptoms of CHF No abdominal pain or jaundice No ankle swelling  BP 139/80 mmHg  Pulse 76  Resp 20  Ht 6\' 2"  (1.88 m)  Wt 162 lb (73.483 kg)  BMI 20.79 kg/m2  SpO2 98% Physical Exam Alert and comfortable Lungs clear, sternum well-healed Heart rate regular, no murmur or gallop Abdomen soft nontender with cholecystostomy tube intact. The suture securing the cholecystostomy tube has cut thru  the skin and is no longer functional however there is a tape manifold securing the catheter. There is no drainage around the catheter site.   Diagnostic Tests: Last chest x-rays clear Last echocardiogram shows normal LV function  Impression: Excellent recovery after urgent multivessel CABG for 90% left main stenosis Patient has no cardiac symptoms. Patient's LV function is normal. Patient is ready to have elective gallbladder surgery and elective cataract surgery and is cleared from cardiac standpoint  Plan:Return as needed. Continue heart healthy habits -eating and continue aspirin and Lipitor indefinitely   Len Childs, MD [facility] [phone]

## 2016-02-29 ENCOUNTER — Encounter: Payer: Self-pay | Admitting: Cardiovascular Disease

## 2016-02-29 ENCOUNTER — Encounter (INDEPENDENT_AMBULATORY_CARE_PROVIDER_SITE_OTHER): Payer: Self-pay

## 2016-02-29 ENCOUNTER — Ambulatory Visit (INDEPENDENT_AMBULATORY_CARE_PROVIDER_SITE_OTHER): Payer: Medicare Other | Admitting: Cardiovascular Disease

## 2016-02-29 VITALS — BP 148/80 | HR 80 | Ht 74.0 in | Wt 161.8 lb

## 2016-02-29 DIAGNOSIS — I471 Supraventricular tachycardia: Secondary | ICD-10-CM | POA: Diagnosis not present

## 2016-02-29 DIAGNOSIS — I2510 Atherosclerotic heart disease of native coronary artery without angina pectoris: Secondary | ICD-10-CM | POA: Diagnosis not present

## 2016-02-29 DIAGNOSIS — Z01818 Encounter for other preprocedural examination: Secondary | ICD-10-CM

## 2016-02-29 MED ORDER — LISINOPRIL 20 MG PO TABS: 20.0000 mg | ORAL_TABLET | Freq: Every day | ORAL | Status: DC

## 2016-02-29 NOTE — Progress Notes (Signed)
Cardiology Office Note   Date:  03/01/2016   ID:  Preston Bell, DOB Sep 22, 1940, MRN [id number]  PCP:  Pcp Not In System  Cardiologist:   Kathlyn Sacramento, MD   Chief Complaint  Patient presents with  . other    No complaints today pt needs cardiac clearance for gallbladder and cataract surgery. Meds reviewed verbally with pt.      History of Present Illness: Preston Bell is a 75 y.o. male who presents for a follow-up visit regarding coronary artery disease status post CABG in February 2017 after he presented with a non-ST elevation myocardial infarction in the setting of acute cholecystitis. Cardiac catheterization at that time showed severe left main and three-vessel coronary artery disease. Ejection fraction was normal. Cholecystitis was treated with percutaneous drainage. He might still needs to have cholecystectomy. He is doing extremely well and denies any chest pain or shortness of breath. His appetite improved and he is gaining some of the weight that he lost. His energy level is good and he is able to do activities of daily living without limitations.    Past Medical History  Diagnosis Date  . History of colectomy   . Hypertension   . GERD (gastroesophageal reflux disease)   . HLD (hyperlipidemia)   . 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 Acute diastolic heart failure (HCC) - EDP severely elevated @ Cath post NSTEMI 10/11/2015  . Anxiety   . Panic disorder   . Cancer ([place])     Lung and Colon  . NSTEMI (non-ST elevated myocardial infarction) ([place])   . CHF (congestive heart failure) ([place])   . SVT (supraventricular tachycardia) ([place])   . Pleural effusion     Right Lung  . Chronic kidney disease     Acute Renal Failure  . Sepsis ([place])   . Anemia   . Gallstone pancreatitis   . Hypercalcemia   . Alcohol abuse     Past Surgical History  Procedure Laterality Date  . Colostomy reversal  2013  . Cardiac  catheterization N/A 10/09/2015    Procedure: Left Heart Cath and Coronary Angiography;  Surgeon: Wellington Hampshire, MD;  Location: [place] [facility];  Service: Cardiovascular;  Laterality: N/A;  . Coronary artery bypass graft N/A 10/13/2015    Procedure: CORONARY ARTERY BYPASS GRAFTING (CABG), ON PUMP, TIMES THREE, USING LEFT INTERNAL MAMMARY ARTERY, RIGHT GREATER SAPHENOUS VEIN HARVESTED ENDOSCOPICALLY;  Surgeon: Ivin Poot, MD;  Location: [place];  Service: Open Heart Surgery;  Laterality: N/A;  LIMA-LAD; SVG-OM; SVG-RCA  . Tee without cardioversion N/A 10/13/2015    Procedure: TRANSESOPHAGEAL ECHOCARDIOGRAM (TEE);  Surgeon: Ivin Poot, MD;  Location: [place];  Service: Open Heart Surgery;  Laterality: N/A;  . Thoracentesis    . Coronary artery bypass graft      x 3 Vessels  . Colon surgery  2012    Colectomy [facility])  . Knee arthroscopy      Right-2011 left-2012     Current Outpatient Prescriptions  Medication Sig Dispense Refill  . aspirin EC 325 MG EC tablet Take 1 tablet (325 mg total) by mouth daily.    Marland Kitchen atorvastatin (LIPITOR) 20 MG tablet Take 1 tablet (20 mg total) by mouth every morning. 30 tablet 1  . esomeprazole (NEXIUM) 40 MG capsule Take 40 mg by mouth daily.    . feeding supplement, ENSURE ENLIVE, (ENSURE ENLIVE) LIQD Take 237 mLs by  mouth 3 (three) times daily between meals. 237 mL 12  . metoprolol (LOPRESSOR) 50 MG tablet Take 1 tablet (50 mg total) by mouth 2 (two) times daily. 60 tablet 1  . mirtazapine (REMERON) 15 MG tablet Take 15 mg by mouth at bedtime.    Marland Kitchen nystatin cream (MYCOSTATIN) Apply 1 application topically 2 (two) times daily. Apply to cholecystostomy drain site only. 30 g 0  . oxyCODONE-acetaminophen (PERCOCET) 5-325 MG tablet Take 1 tablet by mouth every 6 (six) hours as needed for severe pain. 30 tablet 0  . lisinopril (PRINIVIL,ZESTRIL) 20 MG tablet Take 1 tablet (20 mg total) by mouth daily. 30 tablet 3   No current facility-administered  medications for this visit.    Allergies:   Review of patient's allergies indicates no known allergies.    Social History:  The patient  reports that he quit smoking about 32 years ago. He has never used smokeless tobacco. He reports that he uses illicit drugs (Marijuana). He reports that he does not drink alcohol.   Family History:  The patient's family history includes Cervical cancer in his mother; Hypertension in his brother and son; Lung cancer in his brother and father.    ROS:  Please see the history of present illness.   Otherwise, review of systems are positive for none.   All other systems are reviewed and negative.    PHYSICAL EXAM: VS:  BP 148/80 mmHg  Pulse 80  Ht 6\' 2"  (1.88 m)  Wt 161 lb 12 oz (73.369 kg)  BMI 20.76 kg/m2 , BMI Body mass index is 20.76 kg/(m^2). GEN: Well nourished, well developed, in no acute distress HEENT: normal Neck: no JVD, carotid bruits, or masses Cardiac: Regular rate and rhythm; no murmurs, rubs, or gallops,no edema  Respiratory:  clear to auscultation bilaterally, normal work of breathing GI: soft, nontender, nondistended, + BS MS: no deformity or atrophy Skin: warm and dry, no rash Neuro:  Strength and sensation are intact Psych: euthymic mood, full affect   EKG:  EKG is ordered today. The ekg ordered today demonstrates normal sinus rhythm   Recent Labs: 10/12/2015: TSH 9.901* 10/14/2015: Magnesium 2.4 12/14/2015: ALT 34; BUN 10; Creatinine, Ser 0.91; Hemoglobin 9.6*; Platelets 313; Potassium 4.4; Sodium 137    Lipid Panel    Component Value Date/Time   CHOL 84 10/13/2015 0258   TRIG 117 10/13/2015 0258   HDL 15* 10/13/2015 0258   CHOLHDL 5.6 10/13/2015 0258   VLDL 23 10/13/2015 0258   LDLCALC 46 10/13/2015 0258      Wt Readings from Last 3 Encounters:  02/29/16 161 lb 12 oz (73.369 kg)  02/28/16 162 lb (73.483 kg)  12/12/15 159 lb 9.6 oz (72.394 kg)         ASSESSMENT AND PLAN:  1.  Coronary artery disease  involving native coronary arteries without angina: He is doing extremely well with no anginal symptoms. Continue medical therapy.  2. Cholecystitis: He might still require cholecystectomy. He is at low risk from a cardiac standpoint and can proceed at any time.  3. Essential hypertension: Blood pressure is trending up recently and thus I increased lisinopril to 20 mg once daily. Continue metoprolol.  4. Hyperlipidemia: Continue treatment with atorvastatin. Most recent LDL was 46.       Disposition:   FU with me . in 3 month  Signed,  Kathlyn Sacramento, MD  03/01/2016 7:40 AM    [place]

## 2016-02-29 NOTE — Patient Instructions (Addendum)
Medication Instructions:  Your physician has recommended you make the following change in your medication:  INCREASE lisinopril to 20mg  once daily    Labwork: none  Testing/Procedures: none  Follow-Up: Your physician wants you to follow-up in: 1 month with Dr. Fletcher Anon.  You will receive a reminder letter in the mail two months in advance. If you don't receive a letter, please call our office to schedule the follow-up appointment.   Any Other Special Instructions Will Be Listed Below (If Applicable).     If you need a refill on your cardiac medications before your next appointment, please call your pharmacy.

## 2016-03-01 ENCOUNTER — Telehealth: Payer: Self-pay | Admitting: Cardiovascular Disease

## 2016-03-01 NOTE — Telephone Encounter (Signed)
-----   Message from Georgiana Shore, RN sent at 03/01/2016  8:19 AM EDT ----- Could you change to f/u to 3 months not 1 month?  Thanks!  ----- Message -----    From: Wellington Hampshire, MD    Sent: 03/01/2016   7:44 AM      To: Georgiana Shore, RN  Follow up should be in 3 months not 1 month. I misspoke.

## 2016-03-01 NOTE — Telephone Encounter (Signed)
Tried to call pt for he needs to be back in 3m not 76m/called 2x no answer no vm Will try again

## 2016-03-07 NOTE — Telephone Encounter (Signed)
Tried to call patient to make 54m fu with Dr Fletcher Anon  No answer no vm

## 2016-03-13 ENCOUNTER — Ambulatory Visit (INDEPENDENT_AMBULATORY_CARE_PROVIDER_SITE_OTHER): Payer: Medicare Other | Admitting: General Surgery

## 2016-03-13 ENCOUNTER — Encounter: Payer: Self-pay | Admitting: General Surgery

## 2016-03-13 VITALS — BP 156/72 | HR 96 | Temp 97.7°F | Ht 74.0 in | Wt 161.0 lb

## 2016-03-13 DIAGNOSIS — Z434 Encounter for attention to other artificial openings of digestive tract: Secondary | ICD-10-CM

## 2016-03-13 DIAGNOSIS — I2510 Atherosclerotic heart disease of native coronary artery without angina pectoris: Secondary | ICD-10-CM | POA: Diagnosis not present

## 2016-03-13 NOTE — Patient Instructions (Signed)
You have requested to have your Gallbladder removed. We will arrange this to be done on 03/27/16 at [facility] with [place] will be off from work for approximately 1-2 weeks depending on your recovery.   Please avoid greasy and fried foods if at all possible prior to your scheduled surgery to decrease symptoms until then.  Please see the [facility]) pre-care form you have been given today.  If you have any questions or concerns please call our office.

## 2016-03-13 NOTE — Progress Notes (Signed)
Outpatient Surgical Follow Up  03/13/2016  Preston Bell is an 75 y.o. male.   Chief Complaint  Patient presents with  . Other    Established Patient- Cholecystostomy Drain placed February-Dr.Katryna Tschirhart    HPI: 75 year old male returns to clinic today to discuss surgery to remove his cholecystostomy tube. Patient had acute cholecystitis back in February and at the same time was found to be having a heart attack. Treatment of his cholecystitis was with the cholecystostomy tube and antibiotics. His heart attack then required cardiac intervention which has delayed his surgical care for his gallbladder. Patient reports that over the last several weeks he has been asymptomatic but has a strong desire to have the tube removed. He has been eating well without any fevers, chills, nausea, vomiting, diarrhea, constipation, chest pain, shortness of breath. He has been seen by his cardiologist and has obtained cardiac clearance for surgery.  Past Medical History  Diagnosis Date  . History of colectomy   . Hypertension   . GERD (gastroesophageal reflux disease)   . HLD (hyperlipidemia)   . 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 Acute diastolic heart failure (HCC) - EDP severely elevated @ Cath post NSTEMI 10/11/2015  . Anxiety   . Panic disorder   . Cancer ([place])     Lung and Colon  . NSTEMI (non-ST elevated myocardial infarction) ([place])   . CHF (congestive heart failure) ([place])   . SVT (supraventricular tachycardia) ([place])   . Pleural effusion     Right Lung  . Chronic kidney disease     Acute Renal Failure  . Sepsis ([place])   . Anemia   . Gallstone pancreatitis   . Hypercalcemia   . Alcohol abuse     Past Surgical History  Procedure Laterality Date  . Colostomy reversal  2013  . Cardiac catheterization N/A 10/09/2015    Procedure: Left Heart Cath and Coronary Angiography;  Surgeon: Wellington Hampshire, MD;  Location: [place] [facility];  Service:  Cardiovascular;  Laterality: N/A;  . Coronary artery bypass graft N/A 10/13/2015    Procedure: CORONARY ARTERY BYPASS GRAFTING (CABG), ON PUMP, TIMES THREE, USING LEFT INTERNAL MAMMARY ARTERY, RIGHT GREATER SAPHENOUS VEIN HARVESTED ENDOSCOPICALLY;  Surgeon: Ivin Poot, MD;  Location: [place];  Service: Open Heart Surgery;  Laterality: N/A;  LIMA-LAD; SVG-OM; SVG-RCA  . Tee without cardioversion N/A 10/13/2015    Procedure: TRANSESOPHAGEAL ECHOCARDIOGRAM (TEE);  Surgeon: Ivin Poot, MD;  Location: [place];  Service: Open Heart Surgery;  Laterality: N/A;  . Thoracentesis    . Coronary artery bypass graft      x 3 Vessels  . Colon surgery  2012    Colectomy [facility])  . Knee arthroscopy      Right-2011 left-2012    Family History  Problem Relation Age of Onset  . Cervical cancer Mother   . Lung cancer Father   . Lung cancer Brother   . Hypertension Brother   . Hypertension Son     Social History:  reports that he quit smoking about 32 years ago. He has never used smokeless tobacco. He reports that he uses illicit drugs (Marijuana). He reports that he does not drink alcohol.  Allergies: No Known Allergies  Medications reviewed.    ROS A multipoint review of systems was completed, all pertinent positives and negatives are documented within the history of present illness and remainder are negative.   BP 156/72 mmHg  Pulse 96  Temp(Src) 97.7 F (36.5 C) (Oral)  Ht 6\' 2"  (1.88 m)  Wt 73.029 kg (161 lb)  BMI 20.66 kg/m2  Physical Exam  CONSTITUTIONAL: Resting in no distress. EYES: Pupils are equal, round, and reactive to light, Sclera are non-icteric. EARS, NOSE, MOUTH AND THROAT: The oropharynx is clear. The oral mucosa is pink and moist. Hearing is intact to voice. LYMPH NODES: Lymph nodes in the neck are normal. RESPIRATORY: Lungs are coarse throughout. There is normal respiratory effort, with equal breath sounds bilaterally, and without pathologic use of  accessory muscles. CARDIOVASCULAR: Heart is tachycardic GI: The abdomen is soft, Cholecystostomy tube in place into the right upper quadrant currently and no evidence of peritonitis, and nondistended. There is a large obvious hernia at the site of his prior colostomy. Multiple well-healed prior incisions in the midline and lower abdomen. There are normal bowel sounds in all quadrants. GU: Rectal deferred.  MUSCULOSKELETAL: Normal muscle strength and tone. No cyanosis or edema.  SKIN: Turgor is good and there are no pathologic skin lesions or ulcers. NEUROLOGIC: Motor and sensation is grossly normal. Cranial nerves are grossly intact. PSYCH: Oriented to person, place and time. Affect is normal.   No results found for this or any previous visit (from the past 48 hour(s)). No results found. Patient's cholangiogram from 3 months ago was reviewed with the patient. Individually reviewed by me which showed no evidence of choledocholithiasis and clear drainage of contrast into the small bowel. Assessment/Plan:  1. Cholecystostomy care [facility]) 75 year old male with a cholecystostomy. Discussed at length the options of attempting to remove the drain without surgery or taking him to the operating room to attempt a laparoscopic cholecystectomy. I discussed the procedure in detail.  We discussed the risks and benefits of a laparoscopic cholecystectomy and possible cholangiogram including, but not limited to bleeding, infection, injury to surrounding structures such as the intestine or liver, bile leak, retained gallstones, need to convert to an open procedure, prolonged diarrhea, blood clots such as  DVT, common bile duct injury, anesthesia risks, and possible need for additional procedures.  The likelihood of improvement in symptoms and return to the patient's normal status is good. We discussed the typical post-operative recovery course. Patient voiced understanding and desires to proceed. We will plan for a  laparoscopic cholecystectomy on August 2.  A total of 45 minutes was spent with this patient with greater than 50% of the time spent counseling her coordinating care of this patient.      Clayburn Pert, MD FACS General Surgeon  03/13/2016,3:51 PM

## 2016-03-14 ENCOUNTER — Telehealth: Payer: Self-pay | Admitting: General Surgery

## 2016-03-14 NOTE — Telephone Encounter (Signed)
Pt advised of pre op date/time and sx date. Sx: 03/27/16 with Dr Gabrielle Dare cholecystectomy possible gram.  Pre op: 03/19/16 @ 11:15am--office.   Patient made aware to call [phone], between 1-3:00pm the day before surgery, to find out what time to arrive.

## 2016-03-19 ENCOUNTER — Encounter
Admission: RE | Admit: 2016-03-19 | Discharge: 2016-03-19 | Disposition: A | Discharge status: Home / Self Care | Payer: Medicare Other | Source: Ambulatory Visit | Attending: General Surgery | Admitting: General Surgery

## 2016-03-19 DIAGNOSIS — K8080 Other cholelithiasis without obstruction: Secondary | ICD-10-CM | POA: Insufficient documentation

## 2016-03-19 DIAGNOSIS — Z01812 Encounter for preprocedural laboratory examination: Secondary | ICD-10-CM | POA: Insufficient documentation

## 2016-03-19 DIAGNOSIS — I2510 Atherosclerotic heart disease of native coronary artery without angina pectoris: Secondary | ICD-10-CM | POA: Diagnosis not present

## 2016-03-19 LAB — BASIC METABOLIC PANEL
ANION GAP: 6 (ref 5–15)
BUN: 11 mg/dL (ref 6–20)
CHLORIDE: 103 mmol/L (ref 101–111)
CO2: 27 mmol/L (ref 22–32)
Calcium: 9.5 mg/dL (ref 8.9–10.3)
Creatinine, Ser: 0.9 mg/dL (ref 0.61–1.24)
GFR calc non Af Amer: >60 mL/min (ref >60)
Glucose, Bld: 82 mg/dL (ref 65–99)
Potassium: 4 mmol/L (ref 3.5–5.1)
SODIUM: 136 mmol/L (ref 135–145)

## 2016-03-19 LAB — CBC
HEMATOCRIT: 37.6 % — AB (ref 40.0–52.0)
HEMOGLOBIN: 12.9 g/dL — AB (ref 13.0–18.0)
MCH: 33.1 pg (ref 26.0–34.0)
MCHC: 34.4 g/dL (ref 32.0–36.0)
MCV: 96.3 fL (ref 80.0–100.0)
Platelets: 178 10*3/uL (ref 150–440)
RBC: 3.91 MIL/uL — AB (ref 4.40–5.90)
RDW: 13.3 % (ref 11.5–14.5)
WBC: 6.6 10*3/uL (ref 3.8–10.6)

## 2016-03-19 NOTE — Patient Instructions (Signed)
  Your procedure is scheduled on:Wednesday August 2 , 2017. Report to Same Day Surgery. To find out your arrival time please call [phone] between 1PM - 3PM on Tuesday March 26, 2016.  Remember: Instructions that are not followed completely may result in serious medical risk, up to and including death, or upon the discretion of your surgeon and anesthesiologist your surgery may need to be rescheduled.    _x___ 1. Do not eat food or drink liquids after midnight. No gum chewing or hard candies.     ____ 2. No Alcohol for 24 hours before or after surgery.   ____ 3. Bring all medications with you on the day of surgery if instructed.    __x__ 4. Notify your doctor if there is any change in your medical condition     (cold, fever, infections).     Do not wear jewelry, make-up, hairpins, clips or nail polish.  Do not wear lotions, powders, or perfumes. You may wear deodorant.  Do not shave 48 hours prior to surgery. Men may shave face and neck.  Do not bring valuables to the hospital.    [facility] is not responsible for any belongings or valuables.               Contacts, dentures or bridgework may not be worn into surgery.  Leave your suitcase in the car. After surgery it may be brought to your room.  For patients admitted to the hospital, discharge time is determined by your treatment team.   Patients discharged the day of surgery will not be allowed to drive home.    Please read over the following fact sheets that you were given:   [facility] Preparing for Surgery  __X__ Take these medicines the morning of surgery with A SIP OF WATER:    1. metoprolol (LOPRESSOR)  2. lisinopril (PRINIVIL,ZESTRIL)   3. esomeprazole (NEXIUM)  4. atorvastatin (LIPITOR)   ____ Fleet Enema (as directed)   _X___ Use CHG Soap as directed on instruction sheet  ____ Use inhalers on the day of surgery and bring to hospital day of surgery  ____ Stop metformin 2 days prior to surgery    ____  Take 1/2 of usual insulin dose the night before surgery and none on the morning of  surgery.   __x__ Stop aspirin 5 days prior to surgery per Dr. Reginal Lutes instructions.  _X___ Stop Anti-inflammatories such as Advil, Aleve, Ibuprofen, Motrin, Naproxen, Naprosyn, Goodies powders or aspirin products. TYLENOL OK TO TAKE FOR PAIN.   ____ Stop supplements until after surgery.    ____ Bring C-Pap to the hospital.

## 2016-03-19 NOTE — Pre-Procedure Instructions (Signed)
ASSESSMENT AND PLAN:  1.  Coronary artery disease involving native coronary arteries without angina: He is doing extremely well with no anginal symptoms. Continue medical therapy.  2. Cholecystitis: He might still require cholecystectomy. He is at low risk from a cardiac standpoint and can proceed at any time.  3. Essential hypertension: Blood pressure is trending up recently and thus I increased lisinopril to 20 mg once daily. Continue metoprolol.  4. Hyperlipidemia: Continue treatment with atorvastatin. Most recent LDL was 46.       Disposition:   FU with me . in 3 month  Signed,  Kathlyn Sacramento, MD  03/01/2016 7:40 AM    [place]

## 2016-03-26 NOTE — OR Nursing (Signed)
ASSESSMENT AND PLAN:  1.  Coronary artery disease involving native coronary arteries without angina: He is doing extremely well with no anginal symptoms. Continue medical therapy.  2. Cholecystitis: He might still require cholecystectomy. He is at low risk from a cardiac standpoint and can proceed at any time.  3. Essential hypertension: Blood pressure is trending up recently and thus I increased lisinopril to 20 mg once daily. Continue metoprolol.  4. Hyperlipidemia: Continue treatment with atorvastatin. Most recent LDL was 46.       Disposition:   FU with me . in 3 month  Signed,  Kathlyn [place], MD  03/01/2016 7:40 AM    [place]

## 2016-03-27 ENCOUNTER — Ambulatory Visit: Payer: Medicare Other | Admitting: Anesthesiology

## 2016-03-27 ENCOUNTER — Ambulatory Visit: Payer: Medicare Other

## 2016-03-27 ENCOUNTER — Encounter
Admission: RE | Disposition: A | Discharge status: Home / Self Care | Payer: Self-pay | Source: Ambulatory Visit | Attending: General Surgery

## 2016-03-27 ENCOUNTER — Observation Stay
Admission: RE | Admit: 2016-03-27 | Discharge: 2016-03-28 | Disposition: A | Discharge status: Home / Self Care | Payer: Medicare Other | Source: Ambulatory Visit | Attending: General Surgery | Admitting: General Surgery

## 2016-03-27 DIAGNOSIS — I5032 Chronic diastolic (congestive) heart failure: Secondary | ICD-10-CM | POA: Diagnosis not present

## 2016-03-27 DIAGNOSIS — F419 Anxiety disorder, unspecified: Secondary | ICD-10-CM | POA: Diagnosis not present

## 2016-03-27 DIAGNOSIS — K8020 Calculus of gallbladder without cholecystitis without obstruction: Secondary | ICD-10-CM

## 2016-03-27 DIAGNOSIS — Z8249 Family history of ischemic heart disease and other diseases of the circulatory system: Secondary | ICD-10-CM | POA: Diagnosis not present

## 2016-03-27 DIAGNOSIS — E785 Hyperlipidemia, unspecified: Secondary | ICD-10-CM | POA: Insufficient documentation

## 2016-03-27 DIAGNOSIS — K811 Chronic cholecystitis: Secondary | ICD-10-CM | POA: Diagnosis present

## 2016-03-27 DIAGNOSIS — K219 Gastro-esophageal reflux disease without esophagitis: Secondary | ICD-10-CM | POA: Diagnosis not present

## 2016-03-27 DIAGNOSIS — Z8049 Family history of malignant neoplasm of other genital organs: Secondary | ICD-10-CM | POA: Diagnosis not present

## 2016-03-27 DIAGNOSIS — Z87891 Personal history of nicotine dependence: Secondary | ICD-10-CM | POA: Diagnosis not present

## 2016-03-27 DIAGNOSIS — K828 Other specified diseases of gallbladder: Secondary | ICD-10-CM | POA: Diagnosis not present

## 2016-03-27 DIAGNOSIS — I2510 Atherosclerotic heart disease of native coronary artery without angina pectoris: Secondary | ICD-10-CM | POA: Insufficient documentation

## 2016-03-27 DIAGNOSIS — Z801 Family history of malignant neoplasm of trachea, bronchus and lung: Secondary | ICD-10-CM | POA: Diagnosis not present

## 2016-03-27 DIAGNOSIS — I252 Old myocardial infarction: Secondary | ICD-10-CM | POA: Insufficient documentation

## 2016-03-27 DIAGNOSIS — K8050 Calculus of bile duct without cholangitis or cholecystitis without obstruction: Secondary | ICD-10-CM

## 2016-03-27 DIAGNOSIS — D649 Anemia, unspecified: Secondary | ICD-10-CM | POA: Insufficient documentation

## 2016-03-27 DIAGNOSIS — N189 Chronic kidney disease, unspecified: Secondary | ICD-10-CM | POA: Diagnosis not present

## 2016-03-27 DIAGNOSIS — K819 Cholecystitis, unspecified: Secondary | ICD-10-CM | POA: Diagnosis present

## 2016-03-27 DIAGNOSIS — K810 Acute cholecystitis: Principal | ICD-10-CM | POA: Insufficient documentation

## 2016-03-27 DIAGNOSIS — Z9049 Acquired absence of other specified parts of digestive tract: Secondary | ICD-10-CM | POA: Diagnosis not present

## 2016-03-27 DIAGNOSIS — Z951 Presence of aortocoronary bypass graft: Secondary | ICD-10-CM | POA: Insufficient documentation

## 2016-03-27 DIAGNOSIS — I130 Hypertensive heart and chronic kidney disease with heart failure and stage 1 through stage 4 chronic kidney disease, or unspecified chronic kidney disease: Secondary | ICD-10-CM | POA: Insufficient documentation

## 2016-03-27 DIAGNOSIS — I471 Supraventricular tachycardia: Secondary | ICD-10-CM | POA: Insufficient documentation

## 2016-03-27 HISTORY — PX: CHOLECYSTECTOMY: SHX55

## 2016-03-27 LAB — URINE DRUG SCREEN, QUALITATIVE (ARMC ONLY)
AMPHETAMINES, UR SCREEN: NOT DETECTED
BENZODIAZEPINE, UR SCRN: NOT DETECTED
Barbiturates, Ur Screen: NOT DETECTED
Cannabinoid 50 Ng, Ur ~~LOC~~: POSITIVE — AB
Cocaine Metabolite,Ur ~~LOC~~: NOT DETECTED
MDMA (Ecstasy)Ur Screen: NOT DETECTED
METHADONE SCREEN, URINE: NOT DETECTED
Opiate, Ur Screen: NOT DETECTED
Phencyclidine (PCP) Ur S: NOT DETECTED
TRICYCLIC, UR SCREEN: NOT DETECTED

## 2016-03-27 SURGERY — LAPAROSCOPIC CHOLECYSTECTOMY WITH INTRAOPERATIVE CHOLANGIOGRAM
Anesthesia: General | Site: Abdomen | Wound class: Contaminated

## 2016-03-27 MED ORDER — MIDAZOLAM HCL 2 MG/2ML IJ SOLN
INTRAMUSCULAR | Status: DC | PRN
  Administered 2016-03-27: 1 mg via INTRAVENOUS

## 2016-03-27 MED ORDER — CHLORHEXIDINE GLUCONATE CLOTH 2 % EX PADS: 6.0000 | MEDICATED_PAD | Freq: Once | CUTANEOUS | Status: DC

## 2016-03-27 MED ORDER — LACTATED RINGERS IV SOLN
INTRAVENOUS | Status: DC
  Administered 2016-03-27 (×2): via INTRAVENOUS

## 2016-03-27 MED ORDER — ONDANSETRON HCL 4 MG/2ML IJ SOLN: 4.0000 mg | Freq: Four times a day (QID) | INTRAMUSCULAR | Status: DC | PRN

## 2016-03-27 MED ORDER — FENTANYL CITRATE (PF) 100 MCG/2ML IJ SOLN: 25.0000 ug | INTRAMUSCULAR | Status: DC | PRN

## 2016-03-27 MED ORDER — NEOSTIGMINE METHYLSULFATE 10 MG/10ML IV SOLN
INTRAVENOUS | Status: DC | PRN
  Administered 2016-03-27: 3 mg via INTRAVENOUS

## 2016-03-27 MED ORDER — SUCCINYLCHOLINE CHLORIDE 20 MG/ML IJ SOLN
INTRAMUSCULAR | Status: DC | PRN
  Administered 2016-03-27: 80 mg via INTRAVENOUS

## 2016-03-27 MED ORDER — ONDANSETRON HCL 4 MG/2ML IJ SOLN: 4.0000 mg | Freq: Once | INTRAMUSCULAR | Status: DC | PRN

## 2016-03-27 MED ORDER — METOPROLOL TARTRATE 50 MG PO TABS
50.0000 mg | ORAL_TABLET | Freq: Two times a day (BID) | ORAL | Status: DC
  Administered 2016-03-27 – 2016-03-28 (×2): 50 mg via ORAL
  Filled 2016-03-27 (×2): qty 1

## 2016-03-27 MED ORDER — ONDANSETRON 4 MG PO TBDP
4.0000 mg | ORAL_TABLET | Freq: Four times a day (QID) | ORAL | Status: DC | PRN
  Administered 2016-03-28: 4 mg via ORAL
  Filled 2016-03-27: qty 1

## 2016-03-27 MED ORDER — GLYCOPYRROLATE 0.2 MG/ML IJ SOLN
INTRAMUSCULAR | Status: DC | PRN
  Administered 2016-03-27: 0.6 mg via INTRAVENOUS

## 2016-03-27 MED ORDER — LIDOCAINE HCL (CARDIAC) 20 MG/ML IV SOLN
INTRAVENOUS | Status: DC | PRN
  Administered 2016-03-27: 50 mg via INTRAVENOUS

## 2016-03-27 MED ORDER — HYDRALAZINE HCL 20 MG/ML IJ SOLN: 10.0000 mg | INTRAMUSCULAR | Status: DC | PRN

## 2016-03-27 MED ORDER — GLUCAGON HCL RDNA (DIAGNOSTIC) 1 MG IJ SOLR
INTRAMUSCULAR | Status: AC
  Filled 2016-03-27: qty 1

## 2016-03-27 MED ORDER — CEFOXITIN SODIUM 1 G IV SOLR
1.0000 g | INTRAVENOUS | Status: AC
  Administered 2016-03-27: 1 g via INTRAVENOUS
  Filled 2016-03-27: qty 1

## 2016-03-27 MED ORDER — ROCURONIUM BROMIDE 100 MG/10ML IV SOLN
INTRAVENOUS | Status: DC | PRN
  Administered 2016-03-27: 30 mg via INTRAVENOUS
  Administered 2016-03-27: 5 mg via INTRAVENOUS

## 2016-03-27 MED ORDER — PROPOFOL 10 MG/ML IV BOLUS
INTRAVENOUS | Status: DC | PRN
  Administered 2016-03-27: 50 mg via INTRAVENOUS
  Administered 2016-03-27: 100 mg via INTRAVENOUS

## 2016-03-27 MED ORDER — ATORVASTATIN CALCIUM 20 MG PO TABS
20.0000 mg | ORAL_TABLET | Freq: Every morning | ORAL | Status: DC
  Administered 2016-03-28: 20 mg via ORAL
  Filled 2016-03-27: qty 1

## 2016-03-27 MED ORDER — IOTHALAMATE MEGLUMINE 60 % INJ SOLN
INTRAMUSCULAR | Status: DC | PRN
  Administered 2016-03-27: 60 mL

## 2016-03-27 MED ORDER — KETOROLAC TROMETHAMINE 30 MG/ML IJ SOLN
INTRAMUSCULAR | Status: DC | PRN
  Administered 2016-03-27: 30 mg via INTRAVENOUS

## 2016-03-27 MED ORDER — PANTOPRAZOLE SODIUM 40 MG PO TBEC
40.0000 mg | DELAYED_RELEASE_TABLET | Freq: Every day | ORAL | Status: DC
  Administered 2016-03-28: 40 mg via ORAL
  Filled 2016-03-27: qty 1

## 2016-03-27 MED ORDER — HYDROCODONE-ACETAMINOPHEN 5-325 MG PO TABS
1.0000 | ORAL_TABLET | ORAL | Status: DC | PRN
  Administered 2016-03-27 – 2016-03-28 (×4): 2 via ORAL
  Filled 2016-03-27 (×4): qty 2

## 2016-03-27 MED ORDER — HEMOSTATIC AGENTS (NO CHARGE) OPTIME
TOPICAL | Status: DC | PRN
  Administered 2016-03-27 (×2): 1 via TOPICAL

## 2016-03-27 MED ORDER — FENTANYL CITRATE (PF) 100 MCG/2ML IJ SOLN
INTRAMUSCULAR | Status: DC | PRN
  Administered 2016-03-27 (×2): 50 ug via INTRAVENOUS
  Administered 2016-03-27 (×2): 25 ug via INTRAVENOUS
  Administered 2016-03-27: 50 ug via INTRAVENOUS

## 2016-03-27 MED ORDER — LISINOPRIL 20 MG PO TABS
20.0000 mg | ORAL_TABLET | Freq: Every day | ORAL | Status: DC
  Administered 2016-03-28: 20 mg via ORAL
  Filled 2016-03-27: qty 1

## 2016-03-27 MED ORDER — LIDOCAINE HCL (PF) 1 % IJ SOLN
INTRAMUSCULAR | Status: AC
  Filled 2016-03-27: qty 30

## 2016-03-27 MED ORDER — ACETAMINOPHEN 10 MG/ML IV SOLN
INTRAVENOUS | Status: AC
  Filled 2016-03-27: qty 100

## 2016-03-27 MED ORDER — ACETAMINOPHEN 10 MG/ML IV SOLN
INTRAVENOUS | Status: DC | PRN
  Administered 2016-03-27: 1000 mg via INTRAVENOUS

## 2016-03-27 MED ORDER — BUPIVACAINE HCL (PF) 0.5 % IJ SOLN
INTRAMUSCULAR | Status: AC
  Filled 2016-03-27: qty 30

## 2016-03-27 MED ORDER — MIRTAZAPINE 15 MG PO TABS
15.0000 mg | ORAL_TABLET | Freq: Every day | ORAL | Status: DC
  Administered 2016-03-27: 15 mg via ORAL
  Filled 2016-03-27: qty 1

## 2016-03-27 MED ORDER — HYDROMORPHONE HCL 1 MG/ML IJ SOLN: 1.0000 mg | INTRAMUSCULAR | Status: DC | PRN

## 2016-03-27 MED ORDER — LACTATED RINGERS IV SOLN
INTRAVENOUS | Status: DC
  Administered 2016-03-27 (×2): via INTRAVENOUS

## 2016-03-27 MED ORDER — DIPHENHYDRAMINE HCL 12.5 MG/5ML PO ELIX: 12.5000 mg | ORAL_SOLUTION | Freq: Four times a day (QID) | ORAL | Status: DC | PRN

## 2016-03-27 MED ORDER — ASPIRIN EC 325 MG PO TBEC
325.0000 mg | DELAYED_RELEASE_TABLET | Freq: Every day | ORAL | Status: DC
Start: 2016-03-27 — End: 2016-03-28
  Administered 2016-03-27 – 2016-03-28 (×2): 325 mg via ORAL
  Filled 2016-03-27 (×2): qty 1

## 2016-03-27 MED ORDER — DIPHENHYDRAMINE HCL 50 MG/ML IJ SOLN: 12.5000 mg | Freq: Four times a day (QID) | INTRAMUSCULAR | Status: DC | PRN

## 2016-03-27 MED ORDER — ONDANSETRON HCL 4 MG/2ML IJ SOLN
INTRAMUSCULAR | Status: DC | PRN
  Administered 2016-03-27: 4 mg via INTRAVENOUS

## 2016-03-27 MED ORDER — GLUCAGON HCL RDNA (DIAGNOSTIC) 1 MG IJ SOLR
INTRAMUSCULAR | Status: DC | PRN
  Administered 2016-03-27: 1 mg via INTRAVENOUS

## 2016-03-27 MED ORDER — LIDOCAINE HCL 1 % IJ SOLN
INTRAMUSCULAR | Status: DC | PRN
  Administered 2016-03-27: 09:00:00

## 2016-03-27 SURGICAL SUPPLY — 52 items
ADHESIVE MASTISOL STRL (MISCELLANEOUS) ×3 IMPLANT
APPLIER CLIP ROT 10 11.4 M/L (STAPLE) ×3
BAG COUNTER SPONGE EZ (MISCELLANEOUS) ×2 IMPLANT
BLADE SURG SZ11 CARB STEEL (BLADE) ×3 IMPLANT
BULB RESERV EVAC DRAIN JP 100C (MISCELLANEOUS) ×3 IMPLANT
CANISTER SUCT 1200ML W/VALVE (MISCELLANEOUS) ×3 IMPLANT
CATH CHOLANG 76X19 KUMAR (CATHETERS) ×3 IMPLANT
CHLORAPREP W/TINT 26ML (MISCELLANEOUS) ×3 IMPLANT
CLIP APPLIE ROT 10 11.4 M/L (STAPLE) ×1 IMPLANT
CLOSURE WOUND 1/2 X4 (GAUZE/BANDAGES/DRESSINGS) ×1
CONRAY 60ML FOR OR (MISCELLANEOUS) ×3 IMPLANT
COUNTER SPONGE BAG EZ (MISCELLANEOUS) ×1
COVER LIGHT HANDLE STERIS (MISCELLANEOUS) ×3 IMPLANT
DEVICE PMI PUNCTURE CLOSURE (MISCELLANEOUS) ×3 IMPLANT
DRAIN CHANNEL JP 19F (MISCELLANEOUS) ×3 IMPLANT
DRAPE SHEET LG 3/4 BI-LAMINATE (DRAPES) ×3 IMPLANT
DRESSING TELFA 4X3 1S ST N-ADH (GAUZE/BANDAGES/DRESSINGS) ×3 IMPLANT
DRSG TEGADERM 2-3/8X2-3/4 SM (GAUZE/BANDAGES/DRESSINGS) ×12 IMPLANT
ELECT REM PT RETURN 9FT ADLT (ELECTROSURGICAL) ×3
ELECTRODE REM PT RTRN 9FT ADLT (ELECTROSURGICAL) ×1 IMPLANT
GLOVE BIO SURGEON STRL SZ7.5 (GLOVE) ×3 IMPLANT
GLOVE INDICATOR 8.0 STRL GRN (GLOVE) ×3 IMPLANT
GOWN STRL REUS W/ TWL LRG LVL3 (GOWN DISPOSABLE) ×3 IMPLANT
GOWN STRL REUS W/TWL LRG LVL3 (GOWN DISPOSABLE) ×6
HEMOSTAT SURGICEL 2X3 (HEMOSTASIS) ×6 IMPLANT
IRRIGATION STRYKERFLOW (MISCELLANEOUS) ×1 IMPLANT
IRRIGATOR STRYKERFLOW (MISCELLANEOUS) ×3
IV NS 1000ML (IV SOLUTION) ×2
IV NS 1000ML BAXH (IV SOLUTION) ×1 IMPLANT
L-HOOK LAP DISP 36CM (ELECTROSURGICAL) ×3
LABEL OR SOLS (LABEL) ×3 IMPLANT
LHOOK LAP DISP 36CM (ELECTROSURGICAL) ×1 IMPLANT
NEEDLE HYPO 25X1 1.5 SAFETY (NEEDLE) ×3 IMPLANT
NEEDLE VERESS 14GA 120MM (NEEDLE) ×3 IMPLANT
NS IRRIG 500ML POUR BTL (IV SOLUTION) ×3 IMPLANT
PACK LAP CHOLECYSTECTOMY (MISCELLANEOUS) ×3 IMPLANT
PENCIL ELECTRO HAND CTR (MISCELLANEOUS) ×3 IMPLANT
POUCH ENDO CATCH 10MM SPEC (MISCELLANEOUS) ×3 IMPLANT
RELOAD STAPLER BLUE 60MM (STAPLE) ×2 IMPLANT
SCISSORS METZENBAUM CVD 33 (INSTRUMENTS) ×3 IMPLANT
SLEEVE ENDOPATH XCEL 5M (ENDOMECHANICALS) ×6 IMPLANT
SPONGE DRAIN TRACH 4X4 STRL 2S (GAUZE/BANDAGES/DRESSINGS) ×3 IMPLANT
STAPLE ECHEON FLEX 60 POW ENDO (STAPLE) ×3 IMPLANT
STAPLER RELOAD BLUE 60MM (STAPLE) ×6
STRIP CLOSURE SKIN 1/2X4 (GAUZE/BANDAGES/DRESSINGS) ×2 IMPLANT
SUT ETHIBOND 2-0 (SUTURE) ×3 IMPLANT
SUT MNCRL 4-0 (SUTURE) ×2
SUT MNCRL 4-0 27XMFL (SUTURE) ×1
SUTURE MNCRL 4-0 27XMF (SUTURE) ×1 IMPLANT
TROCAR XCEL 12X100 BLDLESS (ENDOMECHANICALS) ×3 IMPLANT
TROCAR XCEL NON-BLD 5MMX100MML (ENDOMECHANICALS) ×3 IMPLANT
TUBING INSUFFLATOR HI FLOW (MISCELLANEOUS) ×3 IMPLANT

## 2016-03-27 NOTE — Brief Op Note (Signed)
03/27/2016  10:28 AM  PATIENT:  Preston Bell  75 y.o. male  PRE-OPERATIVE DIAGNOSIS:  ACUTE CHOLECYSTITIS  POST-OPERATIVE DIAGNOSIS:  ACUTE CHOLECYSTITIS  PROCEDURE:  Procedure(s): LAPAROSCOPIC CHOLECYSTECTOMY WITH INTRAOPERATIVE CHOLANGIOGRAM (N/A)  SURGEON:  Surgeon(s) and Role:    * Clayburn Pert, MD - Primary  PHYSICIAN ASSISTANT:   ASSISTANTS: none   ANESTHESIA:   general  EBL:  Total I/O In: -  Out: 25 [Blood:25]  BLOOD ADMINISTERED:none  DRAINS: (41fr) Blake drain(s) in the gallbladder fossa   LOCAL MEDICATIONS USED:  MARCAINE   , XYLOCAINE  and Amount: 30 ml  SPECIMEN:  Source of Specimen:  gallbladder  DISPOSITION OF SPECIMEN:  PATHOLOGY  COUNTS:  YES  TOURNIQUET:  * No tourniquets in log *  DICTATION: .Dragon Dictation  PLAN OF CARE: Admit for overnight observation  PATIENT DISPOSITION:  PACU - hemodynamically stable.   Delay start of Pharmacological VTE agent (>24hrs) due to surgical blood loss or risk of bleeding: no

## 2016-03-27 NOTE — Interval H&P Note (Signed)
History and Physical Interval Note:  03/27/2016 7:06 AM  Preston Bell  has presented today for surgery, with the diagnosis of ACUTE CHOLECYSTITIS  The various methods of treatment have been discussed with the patient and family. After consideration of risks, benefits and other options for treatment, the patient has consented to  Procedure(s): LAPAROSCOPIC CHOLECYSTECTOMY WITH INTRAOPERATIVE CHOLANGIOGRAM (N/A) as a surgical intervention .  The patient's history has been reviewed, patient examined, no change in status, stable for surgery.  I have reviewed the patient's chart and labs.  Questions were answered to the patient's satisfaction.     Clayburn Pert

## 2016-03-27 NOTE — Anesthesia Preprocedure Evaluation (Addendum)
Anesthesia Evaluation  Patient identified by MRN, date of birth, ID band Patient awake    Reviewed: Allergy & Precautions, NPO status , Patient's Chart, lab work & pertinent test results, reviewed documented beta blocker date and time   Airway Mallampati: II  TM Distance: >3 FB     Dental  (+) Upper Dentures, Lower Dentures   Pulmonary pneumonia, resolved, former smoker,           Cardiovascular hypertension, Pt. on medications and Pt. on home beta blockers + CAD, + Past MI, + CABG and +CHF  + dysrhythmias Supra Ventricular Tachycardia      Neuro/Psych PSYCHIATRIC DISORDERS Anxiety    GI/Hepatic GERD  Controlled,  Endo/Other    Renal/GU Renal disease     Musculoskeletal   Abdominal   Peds  Hematology  (+) anemia ,   Anesthesia Other Findings Lung colon Ca. Hx of SVT. ETOH abuse. MJ use.  Reproductive/Obstetrics                            Anesthesia Physical Anesthesia Plan  ASA: III  Anesthesia Plan: General   Post-op Pain Management:    Induction: Intravenous  Airway Management Planned: Oral ETT  Additional Equipment:   Intra-op Plan:   Post-operative Plan:   Informed Consent: I have reviewed the patients History and Physical, chart, labs and discussed the procedure including the risks, benefits and alternatives for the proposed anesthesia with the patient or authorized representative who has indicated his/her understanding and acceptance.     Plan Discussed with: CRNA  Anesthesia Plan Comments:         Anesthesia Quick Evaluation

## 2016-03-27 NOTE — H&P (View-Only) (Signed)
Outpatient Surgical Follow Up  03/13/2016  Preston Bell is an 75 y.o. male.   Chief Complaint  Patient presents with  . Other    Established Patient- Cholecystostomy Drain placed February-Dr.Casey Fye    HPI: 75 year old male returns to clinic today to discuss surgery to remove his cholecystostomy tube. Patient had acute cholecystitis back in February and at the same time was found to be having a heart attack. Treatment of his cholecystitis was with the cholecystostomy tube and antibiotics. His heart attack then required cardiac intervention which has delayed his surgical care for his gallbladder. Patient reports that over the last several weeks he has been asymptomatic but has a strong desire to have the tube removed. He has been eating well without any fevers, chills, nausea, vomiting, diarrhea, constipation, chest pain, shortness of breath. He has been seen by his cardiologist and has obtained cardiac clearance for surgery.  Past Medical History  Diagnosis Date  . History of colectomy   . Hypertension   . GERD (gastroesophageal reflux disease)   . HLD (hyperlipidemia)   . 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 Acute diastolic heart failure (HCC) - EDP severely elevated @ Cath post NSTEMI 10/11/2015  . Anxiety   . Panic disorder   . Cancer ([place])     Lung and Colon  . NSTEMI (non-ST elevated myocardial infarction) ([place])   . CHF (congestive heart failure) ([place])   . SVT (supraventricular tachycardia) ([place])   . Pleural effusion     Right Lung  . Chronic kidney disease     Acute Renal Failure  . Sepsis ([place])   . Anemia   . Gallstone pancreatitis   . Hypercalcemia   . Alcohol abuse     Past Surgical History  Procedure Laterality Date  . Colostomy reversal  2013  . Cardiac catheterization N/A 10/09/2015    Procedure: Left Heart Cath and Coronary Angiography;  Surgeon: Wellington Hampshire, MD;  Location: [facility];  Service:  Cardiovascular;  Laterality: N/A;  . Coronary artery bypass graft N/A 10/13/2015    Procedure: CORONARY ARTERY BYPASS GRAFTING (CABG), ON PUMP, TIMES THREE, USING LEFT INTERNAL MAMMARY ARTERY, RIGHT GREATER SAPHENOUS VEIN HARVESTED ENDOSCOPICALLY;  Surgeon: Ivin Poot, MD;  Location: [facility];  Service: Open Heart Surgery;  Laterality: N/A;  LIMA-LAD; SVG-OM; SVG-RCA  . Tee without cardioversion N/A 10/13/2015    Procedure: TRANSESOPHAGEAL ECHOCARDIOGRAM (TEE);  Surgeon: Ivin Poot, MD;  Location: [facility];  Service: Open Heart Surgery;  Laterality: N/A;  . Thoracentesis    . Coronary artery bypass graft      x 3 Vessels  . Colon surgery  2012    Colectomy [facility])  . Knee arthroscopy      Right-2011 left-2012    Family History  Problem Relation Age of Onset  . Cervical cancer Mother   . Lung cancer Father   . Lung cancer Brother   . Hypertension Brother   . Hypertension Son     Social History:  reports that he quit smoking about 32 years ago. He has never used smokeless tobacco. He reports that he uses illicit drugs (Marijuana). He reports that he does not drink alcohol.  Allergies: No Known Allergies  Medications reviewed.    ROS A multipoint review of systems was completed, all pertinent positives and negatives are documented within the history of present illness and remainder are negative.   BP 156/72 mmHg  Pulse 96  Temp(Src) 97.7 F (36.5 C) (Oral)  Ht 6\' 2"  (1.88 m)  Wt 73.029 kg (161 lb)  BMI 20.66 kg/m2  Physical Exam  CONSTITUTIONAL: Resting in no distress. EYES: Pupils are equal, round, and reactive to light, Sclera are non-icteric. EARS, NOSE, MOUTH AND THROAT: The oropharynx is clear. The oral mucosa is pink and moist. Hearing is intact to voice. LYMPH NODES: Lymph nodes in the neck are normal. RESPIRATORY: Lungs are coarse throughout. There is normal respiratory effort, with equal breath sounds bilaterally, and without pathologic use of  accessory muscles. CARDIOVASCULAR: Heart is tachycardic GI: The abdomen is soft, Cholecystostomy tube in place into the right upper quadrant currently and no evidence of peritonitis, and nondistended. There is a large obvious hernia at the site of his prior colostomy. Multiple well-healed prior incisions in the midline and lower abdomen. There are normal bowel sounds in all quadrants. GU: Rectal deferred.  MUSCULOSKELETAL: Normal muscle strength and tone. No cyanosis or edema.  SKIN: Turgor is good and there are no pathologic skin lesions or ulcers. NEUROLOGIC: Motor and sensation is grossly normal. Cranial nerves are grossly intact. PSYCH: Oriented to person, place and time. Affect is normal.   No results found for this or any previous visit (from the past 48 hour(s)). No results found. Patient's cholangiogram from 3 months ago was reviewed with the patient. Individually reviewed by me which showed no evidence of choledocholithiasis and clear drainage of contrast into the small bowel. Assessment/Plan:  1. Cholecystostomy care [facility]) 75 year old male with a cholecystostomy. Discussed at length the options of attempting to remove the drain without surgery or taking him to the operating room to attempt a laparoscopic cholecystectomy. I discussed the procedure in detail.  We discussed the risks and benefits of a laparoscopic cholecystectomy and possible cholangiogram including, but not limited to bleeding, infection, injury to surrounding structures such as the intestine or liver, bile leak, retained gallstones, need to convert to an open procedure, prolonged diarrhea, blood clots such as  DVT, common bile duct injury, anesthesia risks, and possible need for additional procedures.  The likelihood of improvement in symptoms and return to the patient's normal status is good. We discussed the typical post-operative recovery course. Patient voiced understanding and desires to proceed. We will plan for a  laparoscopic cholecystectomy on August 2.  A total of 45 minutes was spent with this patient with greater than 50% of the time spent counseling her coordinating care of this patient.      Clayburn Pert, MD FACS General Surgeon  03/13/2016,3:51 PM

## 2016-03-27 NOTE — OR Nursing (Signed)
Cholecystectomy tube in place no redness or drainage noted.

## 2016-03-27 NOTE — Anesthesia Postprocedure Evaluation (Signed)
Anesthesia Post Note  Patient: Preston Bell  Procedure(s) Performed: Procedure(s) (LRB): LAPAROSCOPIC CHOLECYSTECTOMY WITH INTRAOPERATIVE CHOLANGIOGRAM (N/A)  Patient location during evaluation: PACU Anesthesia Type: General Level of consciousness: awake and alert Pain management: pain level controlled Vital Signs Assessment: post-procedure vital signs reviewed and stable Respiratory status: spontaneous breathing, nonlabored ventilation, respiratory function stable and patient connected to nasal cannula oxygen Cardiovascular status: blood pressure returned to baseline and stable Postop Assessment: no signs of nausea or vomiting Anesthetic complications: no    Last Vitals:  Vitals:   03/27/16 1415 03/27/16 1500  BP: (!) 133/57   Pulse: 77   Resp: 16   Temp: (!) 35.5 C 36.4 C    Last Pain:  Vitals:   03/27/16 1500  TempSrc: Axillary  PainSc:                  Janeal Abadi S

## 2016-03-27 NOTE — Anesthesia Procedure Notes (Signed)
Procedure Name: Intubation Performed by: Vaughan Sine Pre-anesthesia Checklist: Patient identified, Emergency Drugs available, Suction available, Patient being monitored and Timeout performed Patient Re-evaluated:Patient Re-evaluated prior to inductionOxygen Delivery Method: Circle system utilized Preoxygenation: Pre-oxygenation with 100% oxygen Intubation Type: IV induction Ventilation: Mask ventilation without difficulty Laryngoscope Size: Miller and 3 Grade View: Grade I Tube type: Oral Tube size: 7.5 mm Number of attempts: 1 Airway Equipment and Method: Stylet Placement Confirmation: ETT inserted through vocal cords under direct vision,  positive ETCO2 and CO2 detector Secured at: 22 cm Tube secured with: Tape Dental Injury: Teeth and Oropharynx as per pre-operative assessment

## 2016-03-27 NOTE — Transfer of Care (Signed)
Immediate Anesthesia Transfer of Care Note  Patient: Preston Bell  Procedure(s) Performed: Procedure(s): LAPAROSCOPIC CHOLECYSTECTOMY WITH INTRAOPERATIVE CHOLANGIOGRAM (N/A)  Patient Location: PACU  Anesthesia Type:General  Level of Consciousness: awake and sedated  Airway & Oxygen Therapy: Patient Spontanous Breathing and Patient connected to face mask oxygen  Post-op Assessment: Report given to RN and Post -op Vital signs reviewed and stable  Post vital signs: Reviewed and stable  Last Vitals:  Vitals:   03/27/16 0706  BP: (!) 142/65  Pulse: 85  Resp: 16  Temp: (!) 35.7 C    Last Pain:  Vitals:   03/27/16 0706  TempSrc: Oral         Complications: No apparent anesthesia complications

## 2016-03-27 NOTE — Op Note (Signed)
Laparoscopic Cholecystectomy  Pre-operative Diagnosis: Previous cholecystitis  Post-operative Diagnosis: Acute on chronic cholecystitis  Procedure: Laparoscopic cholecystectomy  Surgeon: Juanda Crumble T. Adonis Huguenin, MD FACS  Anesthesia: Gen. with endotracheal tube  Assistant: None  Procedure Details  The patient was seen again in the Holding Room. The benefits, complications, treatment options, and expected outcomes were discussed with the patient. The risks of bleeding, infection, recurrence of symptoms, failure to resolve symptoms, bile duct damage, bile duct leak, retained common bile duct stone, bowel injury, any of which could require further surgery and/or ERCP, stent, or papillotomy were reviewed with the patient. The likelihood of improving the patient's symptoms with return to their baseline status is good.  The patient and/or family concurred with the proposed plan, giving informed consent.  The patient was taken to Operating Room, identified as DUELL CIRAULO and the procedure verified as Laparoscopic Cholecystectomy.  A Time Out was held and the above information confirmed.  Prior to the induction of general anesthesia, antibiotic prophylaxis was administered. VTE prophylaxis was in place. General endotracheal anesthesia was then administered and tolerated well. After the induction, the abdomen was prepped with Chloraprep and draped in the sterile fashion. The patient was positioned in the supine position.  Local anesthetic was injected into the skin 2 fingerbreadths below the costal margin in the midclavicular line on the right and an incision made. The Veress needle was placed. Pneumoperitoneum was then created with CO2 and tolerated well without any adverse changes in the patient's vital signs. A 90mm port was placed through this incision and the abdominal cavity was explored.  Extensive adhesions were identified but they were in the left lower quadrant and away from our operative site. Two  5-mm ports were placed, with 1 in the right upper quadrant and another in the infraumbilical region, and a 12 mm epigastric port was placed all under direct vision. All skin incisions  were infiltrated with a local anesthetic agent before making the incision and placing the trocars.   The patient was positioned  in reverse Trendelenburg, tilted slightly to the patient's left.  The gallbladder was identified, the fundus was adhered to the anterior abdominal wall with the cholecystostomy tube in place, it was grasped and retracted cephalad. Adhesions were lysed bluntly and with electrocautery. The infundibulum was grasped and retracted laterally, the gallbladder had minimal mobility. In attempts to mobilize the gallbladder's inflammatory attachments to the liver liver itself was entered into and found to be very friable. Surgicel was required for hemostasis the lateral aspect. The medial aspect was also then opened up with electrocautery. The gallbladder cystic duct junction was never able to be clearly identified.  Due to the difficult anatomy it was then decided to proceed with a cholangiogram via Kumar clamp and catheter through the clearly identified all her. This showed that the gallbladder, cystic duct, common duct appeared to be in close approximation to each other in the area of inflammatory adhesions. Contrast was shown going from the gallbladder into the cystic duct into the common duct. There was delayed entry into the small bowel however after giving the patient dose of glucagon contrast freely flowed into the small bowel. There were no visualized retained stones in the duct. Contrast also refluxed into the common hepatic duct. After completing the cholangiogram the cholangiocatheter, clamp, previous cholecystostomy tube were all removed without difficulty.  The gallbladder was taken from the gallbladder fossa in a antegrade fashion with the electrocautery. When the dissection from the top-down reach  the most  proximal area thought to be safe the decision was made to ligate the gallbladder with a surgical stapler. An Exelon 60 mL blue load powered stapler was used. 2 fires of the stapler was required to remove the gallbladder. The gallbladder was removed and placed in an Endocatch bag. The liver bed was irrigated and inspected. Hemostasis was achieved with the electrocautery and the use of an additional piece of Surgicel. Copious irrigation was utilized and was repeatedly aspirated until clear.  The gallbladder and Endocatch sac were then removed through the epigastric port site. Due to the extensively inflamed nature of the surgical site the decision was then made to place a 19 [place] round Blake drain into the gallbladder fossa area. This placed under direct visualization coming out through the most lateral right upper quadrant trocar site.  Additional inflammatory adhesions from the liver to the anterior abdominal wall were taken down bluntly. This allowed a tongue of omentum to be freed and also placed into the gallbladder fossa.  Inspection of the right upper quadrant was performed. No bleeding, bile duct injury or leak, or bowel injury was noted. Pneumoperitoneum was released.  4-0 subcuticular Monocryl was used to close the skin. Steristrips and Mastisol and sterile dressings were  applied.  The patient was then extubated and brought to the recovery room in stable condition. Sponge, lap, and needle counts were correct at closure and at the conclusion of the case.   Findings: Acute on chronic Cholecystitis   Estimated Blood Loss: 100 mL         Drains: 72 French round Blake         Specimens: Gallbladder           Complications: none               Matisha Termine T. Adonis Huguenin, MD, FACS

## 2016-03-27 NOTE — Progress Notes (Signed)
Dr Adonis Huguenin notified of axillary temp of 95.9.  Pt appears comfortable and is sleeping.  Will continue to monitor.

## 2016-03-28 DIAGNOSIS — K810 Acute cholecystitis: Secondary | ICD-10-CM | POA: Diagnosis not present

## 2016-03-28 LAB — CBC
HCT: 30.4 % — ABNORMAL LOW (ref 40.0–52.0)
HEMOGLOBIN: 10.9 g/dL — AB (ref 13.0–18.0)
MCH: 34.2 pg — AB (ref 26.0–34.0)
MCHC: 35.7 g/dL (ref 32.0–36.0)
MCV: 95.8 fL (ref 80.0–100.0)
PLATELETS: 100 10*3/uL — AB (ref 150–440)
RBC: 3.18 MIL/uL — AB (ref 4.40–5.90)
RDW: 13.3 % (ref 11.5–14.5)
WBC: 6 10*3/uL (ref 3.8–10.6)

## 2016-03-28 LAB — COMPREHENSIVE METABOLIC PANEL
ALBUMIN: 3.4 g/dL — AB (ref 3.5–5.0)
ALK PHOS: 96 U/L (ref 38–126)
ALT: 77 U/L — AB (ref 17–63)
AST: 69 U/L — AB (ref 15–41)
Anion gap: 5 (ref 5–15)
BUN: 13 mg/dL (ref 6–20)
CALCIUM: 8.4 mg/dL — AB (ref 8.9–10.3)
CHLORIDE: 104 mmol/L (ref 101–111)
CO2: 26 mmol/L (ref 22–32)
CREATININE: 1.03 mg/dL (ref 0.61–1.24)
GFR calc Af Amer: >60 mL/min (ref >60)
GFR calc non Af Amer: >60 mL/min (ref >60)
GLUCOSE: 118 mg/dL — AB (ref 65–99)
Potassium: 4.3 mmol/L (ref 3.5–5.1)
SODIUM: 135 mmol/L (ref 135–145)
Total Bilirubin: 1.4 mg/dL — ABNORMAL HIGH (ref 0.3–1.2)
Total Protein: 7.1 g/dL (ref 6.5–8.1)

## 2016-03-28 MED ORDER — HYDROCODONE-ACETAMINOPHEN 5-325 MG PO TABS: 1.0000 | ORAL_TABLET | ORAL | 0 refills | Status: DC | PRN

## 2016-03-28 NOTE — Discharge Instructions (Signed)
Laparoscopic Cholecystectomy, Care After °Refer to this sheet in the next few weeks. These instructions provide you with information about caring for yourself after your procedure. Your health care provider may also give you more specific instructions. Your treatment has been planned according to current medical practices, but problems sometimes occur. Call your health care provider if you have any problems or questions after your procedure. °WHAT TO EXPECT AFTER THE PROCEDURE °After your procedure, it is common to have: °· Pain at your incision sites. You will be given pain medicines to control your pain. °· Mild nausea or vomiting. This should improve after the first 24 hours. °· Bloating and possible shoulder pain from the gas that was used during the procedure. This will improve after the first 24 hours. °HOME CARE INSTRUCTIONS °Incision Care °· Follow instructions from your health care provider about how to take care of your incisions. Make sure you: °¨ Wash your hands with soap and water before you change your bandage (dressing). If soap and water are not available, use hand sanitizer. °¨ Change your dressing as told by your health care provider. °¨ Leave stitches (sutures), skin glue, or adhesive strips in place. These skin closures may need to be in place for 2 weeks or longer. If adhesive strip edges start to loosen and curl up, you may trim the loose edges. Do not remove adhesive strips completely unless your health care provider tells you to do that. °· Do not take baths, swim, or use a hot tub until your health care provider approves. Ask your health care provider if you can take showers. You may only be allowed to take sponge baths for bathing. °General Instructions °· Take over-the-counter and prescription medicines only as told by your health care provider. °· Do not drive or operate heavy machinery while taking prescription pain medicine. °· Return to your normal diet as told by your health care  provider. °· Do not lift anything that is heavier than 10 lb (4.5 kg). °· Do not play contact sports for one week or until your health care provider approves. °SEEK MEDICAL CARE IF:  °· You have redness, swelling, or pain at the site of your incision. °· You have fluid, blood, or pus coming from your incision. °· You notice a bad smell coming from your incision area. °· Your surgical incisions break open. °· You have a fever. °SEEK IMMEDIATE MEDICAL CARE IF: °· You develop a rash. °· You have difficulty breathing. °· You have chest pain. °· You have increasing pain in your shoulders (shoulder strap areas). °· You faint or have dizzy episodes while you are standing. °· You have severe pain in your abdomen. °· You have nausea or vomiting that lasts for more than one day. °  °This information is not intended to replace advice given to you by your health care provider. Make sure you discuss any questions you have with your health care provider. °  °Document Released: 08/12/2005 Document Revised: 05/03/2015 Document Reviewed: 03/24/2013 °Elsevier Interactive Patient Education ©2016 [employer] Inc. ° °

## 2016-03-28 NOTE — Discharge Summary (Signed)
Patient ID: EUAL GUIDICE MRN: [id number] DOB/AGE: [birth year] 75 y.o.  Admit date: 03/27/2016 Discharge date: 03/28/2016  Discharge Diagnoses:  Cholecystitis  Procedures Performed: Laparoscopic cholecystectomy  Discharged Condition: good  Hospital Course: Patient taken to the operating room for a scheduled laparoscopic cholecystectomy. Found to have continued inflammation of the gallbladder requiring placement of a surgical drain during the case. Due to this he was brought into the hospital under observation instead of being discharged home. The time of discharge his abdomen was appropriately tender to palpation but without peritonitis. The drain was in place as showing a serous segments fluid without any evidence of bile. He was tolerating a diet and his pain was controlled with oral medications.  Discharge Orders:  discharge home, okay to shower tomorrow. Do not submerge until incisions heal. Return to the hospital immediately should the drain output change to green.  Disposition: 01-Home or Self Care  Discharge Medications:   Medication List    TAKE these medications   aspirin 325 MG EC tablet Take 1 tablet (325 mg total) by mouth daily.   atorvastatin 20 MG tablet Commonly known as:  LIPITOR Take 1 tablet (20 mg total) by mouth every morning.   esomeprazole 40 MG capsule Commonly known as:  NEXIUM Take 40 mg by mouth daily.   HYDROcodone-acetaminophen 5-325 MG tablet Commonly known as:  NORCO/VICODIN Take 1-2 tablets by mouth every 4 (four) hours as needed for moderate pain.   lisinopril 20 MG tablet Commonly known as:  PRINIVIL,ZESTRIL Take 1 tablet (20 mg total) by mouth daily.   metoprolol 50 MG tablet Commonly known as:  LOPRESSOR Take 1 tablet (50 mg total) by mouth 2 (two) times daily.   REMERON 15 MG tablet Generic drug:  mirtazapine Take 15 mg by mouth at bedtime.        Follwup: Follow-up [place].  Go in 5 day(s).   Specialty:  General Surgery Why:  drain removal Contact information: [place] [phone]          Signed: Clayburn Pert 03/28/2016, 8:52 AM

## 2016-03-28 NOTE — Progress Notes (Signed)
Patient discharged home with family.  All discharge instructions reviewed and discharge paperwork given to patient.  Patient verbalized understanding.  IV removed in tact. Prescription given to patient with all questions and concerns addressed. JP drain teaching done and understanding verbalized.  Patient's daughter at bedside for transfer home.

## 2016-03-29 LAB — SURGICAL PATHOLOGY

## 2016-04-02 ENCOUNTER — Ambulatory Visit: Payer: Medicare Other | Admitting: Cardiovascular Disease

## 2016-04-03 ENCOUNTER — Ambulatory Visit (INDEPENDENT_AMBULATORY_CARE_PROVIDER_SITE_OTHER): Payer: Medicare Other | Admitting: General Surgery

## 2016-04-03 ENCOUNTER — Encounter: Payer: Self-pay | Admitting: General Surgery

## 2016-04-03 VITALS — BP 124/65 | HR 92 | Temp 97.8°F | Ht 74.0 in | Wt 160.0 lb

## 2016-04-03 DIAGNOSIS — Z4889 Encounter for other specified surgical aftercare: Secondary | ICD-10-CM

## 2016-04-03 NOTE — Patient Instructions (Signed)

## 2016-04-03 NOTE — Progress Notes (Signed)
Outpatient Surgical Follow Up  04/03/2016  Preston Bell is an 75 y.o. male.   Chief Complaint  Patient presents with  . Routine Post Op    Laparoscopic Cholecystectomy 03/27/2016 Dr. Adonis Huguenin    HPI: 75 year old male returns to clinic for follow-up 1 week status post laparoscopic cholecystectomy. He reports doing well. He denies any pain. He's been tolerating regular diet and having normal bowel function. He continues to have a JP in place with 20 mL of output over the last 24 hours. He denies any fevers, chills, nausea, vomiting, chest pain, shortness of breath, diarrhea, constipation. He's been very happy with his surgical experience .  Past Medical History:  Diagnosis Date  . Acute diastolic heart failure (HCC) - EDP severely elevated @ Cath post NSTEMI 10/11/2015  . Alcohol abuse   . Anemia   . Anxiety   . 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 Cancer ([place])    Lung and Colon  . CHF (congestive heart failure) ([place])   . Chronic kidney disease    Acute Renal Failure, resolved  . Gallstone pancreatitis   . GERD (gastroesophageal reflux disease)   . History of colectomy   . HLD (hyperlipidemia)   . Hypercalcemia   . Hypertension   . NSTEMI (non-ST elevated myocardial infarction) ([place])   . Panic disorder   . Pleural effusion    Right Lung  . Sepsis ([place])   . SVT (supraventricular tachycardia) (HCC)     Past Surgical History:  Procedure Laterality Date  . CARDIAC CATHETERIZATION N/A 10/09/2015   Procedure: Left Heart Cath and Coronary Angiography;  Surgeon: Wellington Hampshire, MD;  Location: [facility];  Service: Cardiovascular;  Laterality: N/A;  . CHOLECYSTECTOMY N/A 03/27/2016   Procedure: LAPAROSCOPIC CHOLECYSTECTOMY WITH INTRAOPERATIVE CHOLANGIOGRAM;  Surgeon: Clayburn Pert, MD;  Location: [facility];  Service: General;  Laterality: N/A;  . COLON SURGERY  2012   Colectomy ([place])  . COLOSTOMY REVERSAL  2013  .  CORONARY ARTERY BYPASS GRAFT N/A 10/13/2015   Procedure: CORONARY ARTERY BYPASS GRAFTING (CABG), ON PUMP, TIMES THREE, USING LEFT INTERNAL MAMMARY ARTERY, RIGHT GREATER SAPHENOUS VEIN HARVESTED ENDOSCOPICALLY;  Surgeon: Ivin Poot, MD;  Location: [facility];  Service: Open Heart Surgery;  Laterality: N/A;  LIMA-LAD; SVG-OM; SVG-RCA  . CORONARY ARTERY BYPASS GRAFT     x 3 Vessels  . KNEE ARTHROSCOPY     Right-2011 left-2012  . TEE WITHOUT CARDIOVERSION N/A 10/13/2015   Procedure: TRANSESOPHAGEAL ECHOCARDIOGRAM (TEE);  Surgeon: Ivin Poot, MD;  Location: [facility];  Service: Open Heart Surgery;  Laterality: N/A;  . THORACENTESIS      Family History  Problem Relation Age of Onset  . Cervical cancer Mother   . Lung cancer Father   . Lung cancer Brother   . Hypertension Brother   . Hypertension Son     Social History:  reports that he quit smoking about 32 years ago. He has a 30.00 pack-year smoking history. He has never used smokeless tobacco. He reports that he uses drugs, including Marijuana, about 3 times per week. He reports that he does not drink alcohol.  Allergies: No Known Allergies  Medications reviewed.    ROS  A multipoint review of systems was completed, all pertinent positives and negatives are documented within the history of present illness and remainder negative  BP 124/65 (BP Location: Right Arm, Patient Position: Sitting, Cuff Size: Normal)  Pulse 92   Temp 97.8 F (36.6 C) (Oral)   Ht 6\' 2"  (1.88 m)   Wt 72.6 kg (160 lb)   BMI 20.54 kg/m   Physical Exam Gen.: No acute distress Chest: Clear to sedation Heart: Regular rhythm Abdomen: Soft, nontender, nondistended. JP drain and placed in the right upper quadrant draining a serous fluid.    No results found for this or any previous visit (from the past 48 hour(s)). No results found.  Assessment/Plan:  1. Aftercare following surgery 75 year old male 1 week status post laparoscopic cholecystectomy. JP  drain removed in clinic today without difficulty. Pathology reviewed with the patient. Counseled him as to the appropriate timeframe for returning to normal activities and healing. He voiced understanding and will follow-up in clinic on an as-needed basis.     Clayburn Pert, MD FACS General Surgeon  04/03/2016,4:35 PM

## 2016-05-30 ENCOUNTER — Encounter: Payer: Self-pay | Admitting: Cardiovascular Disease

## 2016-05-30 ENCOUNTER — Ambulatory Visit (INDEPENDENT_AMBULATORY_CARE_PROVIDER_SITE_OTHER): Payer: Medicare Other | Admitting: Cardiovascular Disease

## 2016-05-30 VITALS — BP 120/80 | HR 93 | Ht 74.0 in | Wt 163.5 lb

## 2016-05-30 DIAGNOSIS — I10 Essential (primary) hypertension: Secondary | ICD-10-CM

## 2016-05-30 DIAGNOSIS — I2510 Atherosclerotic heart disease of native coronary artery without angina pectoris: Secondary | ICD-10-CM | POA: Diagnosis not present

## 2016-05-30 DIAGNOSIS — E7800 Pure hypercholesterolemia, unspecified: Secondary | ICD-10-CM

## 2016-05-30 NOTE — Patient Instructions (Signed)
Medication Instructions: Continue same medications.   Labwork: None.   Procedures/Testing: None.   Follow-Up: 6 months with Dr. Maeley Matton.   Any Additional Special Instructions Will Be Listed Below (If Applicable).     If you need a refill on your cardiac medications before your next appointment, please call your pharmacy.   

## 2016-05-30 NOTE — Progress Notes (Signed)
Cardiology Office Note   Date:  05/30/2016   ID:  Preston Bell, DOB 1940/10/15, MRN [id number]  PCP:  Pcp Not In System  Cardiologist:   Kathlyn Sacramento, MD   Chief Complaint  Patient presents with  . other    3 month follow up. Meds reviewed by the pt. verbally. "doing well."       History of Present Illness: Preston Bell is a 75 y.o. male who presents for a follow-up visit regarding coronary artery disease status post CABG in February 2017 after he presented with a non-ST elevation myocardial infarction in the setting of acute cholecystitis. Cardiac catheterization at that time showed severe left main and three-vessel coronary artery disease. Ejection fraction was normal. Cholecystitis was treated with percutaneous drainage.  He underwent successful cholecystectomy in August without complications. He has been doing extremely well and denies any chest pain, shortness of breath, abdominal pain or dizziness. He is taking all his medications regularly with no reported side effects.   Past Medical History:  Diagnosis Date  . Acute diastolic heart failure (HCC) - EDP severely elevated @ Cath post NSTEMI 10/11/2015  . Alcohol abuse   . Anemia   . Anxiety   . 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 Cancer ([place])    Lung and Colon  . CHF (congestive heart failure) ([place])   . Chronic kidney disease    Acute Renal Failure, resolved  . Gallstone pancreatitis   . GERD (gastroesophageal reflux disease)   . History of colectomy   . HLD (hyperlipidemia)   . Hypercalcemia   . Hypertension   . NSTEMI (non-ST elevated myocardial infarction) ([place])   . Panic disorder   . Pleural effusion    Right Lung  . Sepsis ([place])   . SVT (supraventricular tachycardia) (HCC)     Past Surgical History:  Procedure Laterality Date  . CARDIAC CATHETERIZATION N/A 10/09/2015   Procedure: Left Heart Cath and Coronary Angiography;  Surgeon: Wellington Hampshire, MD;  Location: [facility] [facility];  Service: Cardiovascular;  Laterality: N/A;  . CHOLECYSTECTOMY N/A 03/27/2016   Procedure: LAPAROSCOPIC CHOLECYSTECTOMY WITH INTRAOPERATIVE CHOLANGIOGRAM;  Surgeon: Clayburn Pert, MD;  Location: [facility];  Service: General;  Laterality: N/A;  . COLON SURGERY  2012   Colectomy ([place])  . COLOSTOMY REVERSAL  2013  . CORONARY ARTERY BYPASS GRAFT N/A 10/13/2015   Procedure: CORONARY ARTERY BYPASS GRAFTING (CABG), ON PUMP, TIMES THREE, USING LEFT INTERNAL MAMMARY ARTERY, RIGHT GREATER SAPHENOUS VEIN HARVESTED ENDOSCOPICALLY;  Surgeon: Ivin Poot, MD;  Location: [facility];  Service: Open Heart Surgery;  Laterality: N/A;  LIMA-LAD; SVG-OM; SVG-RCA  . CORONARY ARTERY BYPASS GRAFT     x 3 Vessels  . KNEE ARTHROSCOPY     Right-2011 left-2012  . TEE WITHOUT CARDIOVERSION N/A 10/13/2015   Procedure: TRANSESOPHAGEAL ECHOCARDIOGRAM (TEE);  Surgeon: Ivin Poot, MD;  Location: [facility];  Service: Open Heart Surgery;  Laterality: N/A;  . THORACENTESIS       Current Outpatient Prescriptions  Medication Sig Dispense Refill  . aspirin EC 325 MG EC tablet Take 1 tablet (325 mg total) by mouth daily.    Marland Kitchen atorvastatin (LIPITOR) 20 MG tablet Take 1 tablet (20 mg total) by mouth every morning. 30 tablet 1  . esomeprazole (NEXIUM) 40 MG capsule Take 40 mg by mouth daily.    Marland Kitchen HYDROcodone-acetaminophen (NORCO/VICODIN) 5-325 MG tablet Take 1-2 tablets by  mouth every 4 (four) hours as needed for moderate pain. 30 tablet 0  . lisinopril (PRINIVIL,ZESTRIL) 20 MG tablet Take 1 tablet (20 mg total) by mouth daily. 30 tablet 3  . metoprolol (LOPRESSOR) 50 MG tablet Take 1 tablet (50 mg total) by mouth 2 (two) times daily. 60 tablet 1  . mirtazapine (REMERON) 15 MG tablet Take 15 mg by mouth at bedtime.     No current facility-administered medications for this visit.     Allergies:   Review of patient's allergies indicates no known allergies.    Social History:  The  patient  reports that he quit smoking about 32 years ago. He has a 30.00 pack-year smoking history. He has never used smokeless tobacco. He reports that he uses drugs, including Marijuana, about 3 times per week. He reports that he does not drink alcohol.   Family History:  The patient's family history includes Cervical cancer in his mother; Hypertension in his brother and son; Lung cancer in his brother and father.    ROS:  Please see the history of present illness.   Otherwise, review of systems are positive for none.   All other systems are reviewed and negative.    PHYSICAL EXAM: VS:  BP 120/80 (BP Location: Left Arm, Patient Position: Sitting, Cuff Size: Normal)   Pulse 93   Ht 6\' 2"  (1.88 m)   Wt 163 lb 8 oz (74.2 kg)   BMI 20.99 kg/m  , BMI Body mass index is 20.99 kg/m. GEN: Well nourished, well developed, in no acute distress HEENT: normal Neck: no JVD, carotid bruits, or masses Cardiac: Regular rate and rhythm; no murmurs, rubs, or gallops,no edema  Respiratory:  clear to auscultation bilaterally, normal work of breathing GI: soft, nontender, nondistended, + BS MS: no deformity or atrophy Skin: warm and dry, no rash Neuro:  Strength and sensation are intact Psych: euthymic mood, full affect   EKG:  EKG is not ordered today.    Recent Labs: 10/12/2015: TSH 9.901 10/14/2015: Magnesium 2.4 03/28/2016: ALT 77; BUN 13; Creatinine, Ser 1.03; Hemoglobin 10.9; Platelets 100; Potassium 4.3; Sodium 135    Lipid Panel    Component Value Date/Time   CHOL 84 10/13/2015 0258   TRIG 117 10/13/2015 0258   HDL 15 (L) 10/13/2015 0258   CHOLHDL 5.6 10/13/2015 0258   VLDL 23 10/13/2015 0258   LDLCALC 46 10/13/2015 0258      Wt Readings from Last 3 Encounters:  05/30/16 163 lb 8 oz (74.2 kg)  04/03/16 160 lb (72.6 kg)  03/27/16 161 lb (73 kg)         ASSESSMENT AND PLAN:  1.  Coronary artery disease involving native coronary arteries without angina: He is doing  extremely well with no anginal symptoms. Continue medical therapy.  2. Essential hypertension: Blood pressure is well controlled on lisinopril and metoprolol.  4. Hyperlipidemia: Continue treatment with atorvastatin. Most recent LDL was 46 which is optimal.   Disposition:   FU with me . in 6 month  Signed,  Kathlyn Sacramento, MD  05/30/2016 11:05 AM    [place]

## 2016-06-06 ENCOUNTER — Emergency Department
Admission: EM | Admit: 2016-06-06 | Discharge: 2016-06-06 | Disposition: A | Discharge status: Other Acute Inpatient Hospital | Payer: Medicare Other | Attending: Emergency Medicine | Admitting: Emergency Medicine

## 2016-06-06 ENCOUNTER — Emergency Department: Payer: Medicare Other

## 2016-06-06 ENCOUNTER — Encounter: Payer: Self-pay | Admitting: Emergency Medicine

## 2016-06-06 DIAGNOSIS — Z87891 Personal history of nicotine dependence: Secondary | ICD-10-CM | POA: Insufficient documentation

## 2016-06-06 DIAGNOSIS — Z951 Presence of aortocoronary bypass graft: Secondary | ICD-10-CM | POA: Insufficient documentation

## 2016-06-06 DIAGNOSIS — Z79899 Other long term (current) drug therapy: Secondary | ICD-10-CM | POA: Insufficient documentation

## 2016-06-06 DIAGNOSIS — I130 Hypertensive heart and chronic kidney disease with heart failure and stage 1 through stage 4 chronic kidney disease, or unspecified chronic kidney disease: Secondary | ICD-10-CM | POA: Diagnosis not present

## 2016-06-06 DIAGNOSIS — K8050 Calculus of bile duct without cholangitis or cholecystitis without obstruction: Secondary | ICD-10-CM | POA: Insufficient documentation

## 2016-06-06 DIAGNOSIS — I5031 Acute diastolic (congestive) heart failure: Secondary | ICD-10-CM | POA: Diagnosis not present

## 2016-06-06 DIAGNOSIS — N189 Chronic kidney disease, unspecified: Secondary | ICD-10-CM | POA: Diagnosis not present

## 2016-06-06 DIAGNOSIS — I2510 Atherosclerotic heart disease of native coronary artery without angina pectoris: Secondary | ICD-10-CM | POA: Insufficient documentation

## 2016-06-06 DIAGNOSIS — Z7982 Long term (current) use of aspirin: Secondary | ICD-10-CM | POA: Diagnosis not present

## 2016-06-06 DIAGNOSIS — R1011 Right upper quadrant pain: Secondary | ICD-10-CM | POA: Diagnosis present

## 2016-06-06 LAB — COMPREHENSIVE METABOLIC PANEL
ALBUMIN: 4.7 g/dL (ref 3.5–5.0)
ALK PHOS: 93 U/L (ref 38–126)
ALT: 15 U/L — AB (ref 17–63)
ANION GAP: 10 (ref 5–15)
AST: 26 U/L (ref 15–41)
BUN: 16 mg/dL (ref 6–20)
CALCIUM: 9.5 mg/dL (ref 8.9–10.3)
CO2: 23 mmol/L (ref 22–32)
CREATININE: 1.12 mg/dL (ref 0.61–1.24)
Chloride: 106 mmol/L (ref 101–111)
GFR calc Af Amer: >60 mL/min (ref >60)
GFR calc non Af Amer: >60 mL/min (ref >60)
GLUCOSE: 109 mg/dL — AB (ref 65–99)
Potassium: 3.7 mmol/L (ref 3.5–5.1)
SODIUM: 139 mmol/L (ref 135–145)
Total Bilirubin: 0.7 mg/dL (ref 0.3–1.2)
Total Protein: 9.1 g/dL — ABNORMAL HIGH (ref 6.5–8.1)

## 2016-06-06 LAB — CBC WITH DIFFERENTIAL/PLATELET
BASOS ABS: 0 10*3/uL (ref 0–0.1)
BASOS PCT: 0 %
EOS ABS: 0.2 10*3/uL (ref 0–0.7)
Eosinophils Relative: 2 %
HEMATOCRIT: 35.6 % — AB (ref 40.0–52.0)
HEMOGLOBIN: 12.5 g/dL — AB (ref 13.0–18.0)
Lymphocytes Relative: 18 %
Lymphs Abs: 2.3 10*3/uL (ref 1.0–3.6)
MCH: 33.4 pg (ref 26.0–34.0)
MCHC: 35 g/dL (ref 32.0–36.0)
MCV: 95.2 fL (ref 80.0–100.0)
Monocytes Absolute: 0.9 10*3/uL (ref 0.2–1.0)
Monocytes Relative: 7 %
NEUTROS ABS: 8.9 10*3/uL — AB (ref 1.4–6.5)
NEUTROS PCT: 73 %
Platelets: 211 10*3/uL (ref 150–440)
RBC: 3.74 MIL/uL — AB (ref 4.40–5.90)
RDW: 14.6 % — ABNORMAL HIGH (ref 11.5–14.5)
WBC: 12.4 10*3/uL — AB (ref 3.8–10.6)

## 2016-06-06 LAB — LIPASE, BLOOD: Lipase: 92 U/L — ABNORMAL HIGH (ref 11–51)

## 2016-06-06 MED ORDER — SODIUM CHLORIDE 0.9 % IV BOLUS (SEPSIS)
500.0000 mL | Freq: Once | INTRAVENOUS | Status: AC
  Administered 2016-06-06: 500 mL via INTRAVENOUS

## 2016-06-06 MED ORDER — ONDANSETRON HCL 4 MG/2ML IJ SOLN
4.0000 mg | Freq: Once | INTRAMUSCULAR | Status: AC
  Administered 2016-06-06: 4 mg via INTRAVENOUS
  Filled 2016-06-06: qty 2

## 2016-06-06 MED ORDER — IOPAMIDOL (ISOVUE-300) INJECTION 61%
30.0000 mL | Freq: Once | INTRAVENOUS | Status: AC
  Administered 2016-06-06: 30 mL via ORAL

## 2016-06-06 MED ORDER — IOPAMIDOL (ISOVUE-300) INJECTION 61%
100.0000 mL | Freq: Once | INTRAVENOUS | Status: AC | PRN
  Administered 2016-06-06: 100 mL via INTRAVENOUS

## 2016-06-06 MED ORDER — HYDROMORPHONE HCL 1 MG/ML IJ SOLN
1.0000 mg | Freq: Once | INTRAMUSCULAR | Status: AC
  Administered 2016-06-06: 1 mg via INTRAVENOUS
  Filled 2016-06-06: qty 1

## 2016-06-06 NOTE — ED Notes (Signed)
Pt oxygen saturation registering 85% room air, pt sleeping at this time, upon waking pt oxygen saturation reading 96% room air, pt denies hx of sleep apnea. Pt placed on 2 L nasal cannula, MD notified.

## 2016-06-06 NOTE — ED Notes (Signed)
Preston Bell, pt daughter, [phone]

## 2016-06-06 NOTE — ED Notes (Signed)
Patient transported to CT 

## 2016-06-06 NOTE — ED Triage Notes (Signed)
Reports RUQ pain  And vomiting "green stuff again"  States it feels like when he had part of his gallbladder removed 2 months ago.

## 2016-06-06 NOTE — ED Notes (Signed)
Pt finished drinking contrast, CT tech notified.

## 2016-06-06 NOTE — ED Provider Notes (Signed)
Time Seen: Approximately 1855 I have reviewed the triage notes  Chief Complaint: Abdominal Pain   History of Present Illness: Preston Bell is a 74 y.o. male who presents with acute onset of some nausea and vomiting starting this morning at 11 AM. Patient states he's vomited multiple times with biliary emesis. The patient denies any fever or chest pain. He does have a cardiac history. He's also had some recent surgery with first bypass surgery and then had a cholecystectomy performed and was told that there was a lot of adhesions at that time. He's also had a previous evening with delayed wound anastomosis. The patient has a persistent reducible hernia that's over the left lower quadrant.   Past Medical History:  Diagnosis Date  . Acute diastolic heart failure (HCC) - EDP severely elevated @ Cath post NSTEMI 10/11/2015  . Alcohol abuse   . Anemia   . Anxiety   . 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 Cancer ([place])    Lung and Colon  . CHF (congestive heart failure) ([place])   . Chronic kidney disease    Acute Renal Failure, resolved  . Gallstone pancreatitis   . GERD (gastroesophageal reflux disease)   . History of colectomy   . HLD (hyperlipidemia)   . Hypercalcemia   . Hypertension   . NSTEMI (non-ST elevated myocardial infarction) ([place])   . Panic disorder   . Pleural effusion    Right Lung  . Sepsis ([place])   . SVT (supraventricular tachycardia) [facility])     Patient Active Problem List   Diagnosis Date Noted  . Gallstones   . UTI (lower urinary tract infection)   . Acute renal failure (ARF) ([place]) 12/03/2015  . Right flank pain   . RUQ pain   . 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 Essential hypertension 10/12/2015  . Hyperlipidemia 10/12/2015  . Anemia, unspecified 10/12/2015  . SVT (supraventricular tachycardia) ([place]) 10/12/2015  . Sinus tachycardia 10/12/2015  . Coronary artery disease involving native coronary artery of native  heart without angina pectoris   . Pancreatitis due to common bile duct stone   . Acute diastolic heart failure ([place]) - EDP severely elevated @ Cath post NSTEMI 10/11/2015  . Left main coronary artery disease: 90% LM 10/10/2015  . Right lower lobe pneumonia ([place]) 10/10/2015  . Bacteremia due to Klebsiella pneumoniae 10/10/2015  . 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 Pleural effusion on right - s/p thoracentesis   . Vomiting and diarrhea   . Elevated troponin I level 10/06/2015  . NSTEMI (non-ST elevated myocardial infarction) ([place])   . Cholecystitis 10/05/2015  . Sepsis ([place]) 10/05/2015  . Generalized abdominal pain   . Calcium blood increased 06/16/2015  . Weight loss 06/14/2015  . Lung mass 02/22/2014  . Hernia, incisional 02/22/2014  . H/O malignant neoplasm of colon 02/22/2014  . H/O alcohol abuse 02/22/2014  . Former heavy tobacco smoker 02/22/2014  . Anxiety 02/22/2014  . Acid reflux 06/16/2002  . Episodic paroxysmal anxiety disorder 04/03/2001    Past Surgical History:  Procedure Laterality Date  . CARDIAC CATHETERIZATION N/A 10/09/2015   Procedure: Left Heart Cath and Coronary Angiography;  Surgeon: Wellington Hampshire, MD;  Location: [place] [facility];  Service: Cardiovascular;  Laterality: N/A;  . CHOLECYSTECTOMY N/A 03/27/2016   Procedure: LAPAROSCOPIC CHOLECYSTECTOMY WITH INTRAOPERATIVE CHOLANGIOGRAM;  Surgeon: Clayburn Pert, MD;  Location: [facility]  ORS;  Service: General;  Laterality: N/A;  . COLON SURGERY  2012   Colectomy [facility])  . COLOSTOMY REVERSAL  2013  . CORONARY ARTERY BYPASS GRAFT N/A 10/13/2015   Procedure: CORONARY ARTERY BYPASS GRAFTING (CABG), ON PUMP, TIMES THREE, USING LEFT INTERNAL MAMMARY ARTERY, RIGHT GREATER SAPHENOUS VEIN HARVESTED ENDOSCOPICALLY;  Surgeon: Ivin Poot, MD;  Location: [place];  Service: Open Heart Surgery;  Laterality: N/A;  LIMA-LAD; SVG-OM; SVG-RCA  . CORONARY ARTERY BYPASS  GRAFT     x 3 Vessels  . KNEE ARTHROSCOPY     Right-2011 left-2012  . TEE WITHOUT CARDIOVERSION N/A 10/13/2015   Procedure: TRANSESOPHAGEAL ECHOCARDIOGRAM (TEE);  Surgeon: Ivin Poot, MD;  Location: [place];  Service: Open Heart Surgery;  Laterality: N/A;  . THORACENTESIS      Past Surgical History:  Procedure Laterality Date  . CARDIAC CATHETERIZATION N/A 10/09/2015   Procedure: Left Heart Cath and Coronary Angiography;  Surgeon: Wellington Hampshire, MD;  Location: [place] [facility];  Service: Cardiovascular;  Laterality: N/A;  . CHOLECYSTECTOMY N/A 03/27/2016   Procedure: LAPAROSCOPIC CHOLECYSTECTOMY WITH INTRAOPERATIVE CHOLANGIOGRAM;  Surgeon: Clayburn Pert, MD;  Location: [facility] ORS;  Service: General;  Laterality: N/A;  . COLON SURGERY  2012   Colectomy ([place])  . COLOSTOMY REVERSAL  2013  . CORONARY ARTERY BYPASS GRAFT N/A 10/13/2015   Procedure: CORONARY ARTERY BYPASS GRAFTING (CABG), ON PUMP, TIMES THREE, USING LEFT INTERNAL MAMMARY ARTERY, RIGHT GREATER SAPHENOUS VEIN HARVESTED ENDOSCOPICALLY;  Surgeon: Ivin Poot, MD;  Location: [place];  Service: Open Heart Surgery;  Laterality: N/A;  LIMA-LAD; SVG-OM; SVG-RCA  . CORONARY ARTERY BYPASS GRAFT     x 3 Vessels  . KNEE ARTHROSCOPY     Right-2011 left-2012  . TEE WITHOUT CARDIOVERSION N/A 10/13/2015   Procedure: TRANSESOPHAGEAL ECHOCARDIOGRAM (TEE);  Surgeon: Ivin Poot, MD;  Location: [place];  Service: Open Heart Surgery;  Laterality: N/A;  . THORACENTESIS      Current Outpatient Rx  . Order #: [id number] Class: OTC  . Order #: [id number] Class: Print  . Order #: [id number] Class: Historical Med  . Order #: [id number] Class: Print  . Order #: [id number] Class: Normal  . Order #: [id number] Class: Print  . Order #: [id number] Class: Historical Med    Allergies:  Review of patient's allergies indicates no known allergies.  Family History: Family History  Problem Relation Age of Onset  . Cervical cancer Mother   . Lung  cancer Father   . Lung cancer Brother   . Hypertension Brother   . Hypertension Son     Social History: Social History  Substance Use Topics  . Smoking status: Former Smoker    Packs/day: 1.00    Years: 30.00    Quit date: 10/06/1983  . Smokeless tobacco: Never Used  . Alcohol use No     Comment: Quit consuming alcohol in 1988. Former Banker     Review of Systems:   10 point review of systems was performed and was otherwise negative:  Constitutional: No fever Eyes: No visual disturbances ENT: No sore throat, ear pain Cardiac: No chest pain Respiratory: No shortness of breath, wheezing, or stridor Abdomen: Patient has right-sided abdominal pain. He states the pain is intermittent and crampy in nature. His last bowel movement was earlier this morning approximately 8 AM. Endocrine: No weight loss, No night sweats Extremities: No peripheral edema, cyanosis Skin: No rashes, easy bruising Neurologic: No focal weakness, trouble with speech or swollowing Urologic: No dysuria, Hematuria,  or urinary frequency   Physical Exam:  ED Triage Vitals  Enc Vitals Group     BP 06/06/16 1813 (!) 203/109     Pulse Rate 06/06/16 1813 (!) 111     Resp --      Temp 06/06/16 1813 97.8 F (36.6 C)     Temp Source 06/06/16 1813 Oral     SpO2 06/06/16 1813 98 %     Weight 06/06/16 1804 163 lb (73.9 kg)     Height 06/06/16 1804 6\' 2"  (1.88 m)     Head Circumference --      Peak Flow --      Pain Score --      Pain Loc --      Pain Edu? --      Excl. in [place]? --     General: Awake , Alert , and Oriented times 3; GCS 15 Head: Normal cephalic , atraumatic Eyes: Pupils equal , round, reactive to light Nose/Throat: No nasal drainage, patent upper airway without erythema or exudate.  Neck: Supple, Full range of motion, No anterior adenopathy or palpable thyroid masses Lungs: Clear to ascultation without wheezes , rhonchi, or rales Heart: Regular rate, regular rhythm without murmurs ,  gallops , or rubs Abdomen:Old well-healed well aligned surgical scars with a hernia in the left lower quadrant. There is no rebound, guarding, rigidity or palpable masses. Bowel sounds are diminished but symmetric in all 4 quadrants no obvious abdominal distention. Hernia in the left lower quadrant is reducible without any peritoneal signs or indications of entrapment or incarceration       Extremities: 2 plus symmetric pulses. No edema, clubbing or cyanosis Neurologic: normal ambulation, Motor symmetric without deficits, sensory intact Skin: warm, dry, no rashes   Labs:   All laboratory work was reviewed including any pertinent negatives or positives listed below:  Labs Reviewed  CBC WITH DIFFERENTIAL/PLATELET - Abnormal; Notable for the following:       Result Value   WBC 12.4 (*)    RBC 3.74 (*)    Hemoglobin 12.5 (*)    HCT 35.6 (*)    RDW 14.6 (*)    Neutro Abs 8.9 (*)    All other components within normal limits  COMPREHENSIVE METABOLIC PANEL - Abnormal; Notable for the following:    Glucose, Bld 109 (*)    Total Protein 9.1 (*)    ALT 15 (*)    All other components within normal limits  LIPASE, BLOOD - Abnormal; Notable for the following:    Lipase 92 (*)    All other components within normal limits    EKG: ED ECG REPORT I, Daymon Larsen, the attending physician, personally viewed and interpreted this ECG.  Date: 06/06/2016 EKG Time: 1828 Rate: 103 Rhythm: Sinus tachycardia QRS Axis: normal Intervals: normal ST/T Wave abnormalities: normal Conduction Disturbances: none Narrative Interpretation: unremarkable Left ventricular hypertrophy with no obvious acute ischemic changes   Radiology: "Ct Abdomen Pelvis W Contrast  Result Date: 06/06/2016 CLINICAL DATA:  Initial evaluation for acute right upper quadrant pain. EXAM: CT ABDOMEN AND PELVIS WITH CONTRAST TECHNIQUE: Multidetector CT imaging of the abdomen and pelvis was performed using the standard protocol  following bolus administration of intravenous contrast. CONTRAST:  131mL ISOVUE-300 IOPAMIDOL (ISOVUE-300) INJECTION 61% COMPARISON:  Prior CT from 10/05/2015. FINDINGS: Lower chest: Bilateral pleural effusions, left greater than right. Associated bibasilar atelectasis. Irregular pleural plaque again noted at the right hemidiaphragm with associated soft tissue thickening measuring approximately 4.1 x  2.3 cm, similar to previous. No pericardial effusion. Mild cardiomegaly noted. Hepatobiliary: Liver demonstrates a normal contrast enhanced appearance. Patient is status post cholecystectomy. Suture material present at the gallbladder fossa. There is dilatation of the common bile duct somewhat greater than expected for normal post cholecystectomy changes, measuring up to 14 mm in diameter. 11 mm irregular hyperdensity at the proximal aspect of the common bile duct near the suture material suspicious for possible stone (series 2, image 26). An additional 6 mm stone seen layering dependently within the distal duct (series 5, image 44). Mild intrahepatic biliary dilatation noted as well. Distal pancreatic duct also mildly dilated up to 7 mm. No definite mass lesion identified. Pancreas: Other than the aforementioned ductal dilatation, pancreas is otherwise unremarkable without acute inflammatory changes. Spleen: Spleen demonstrates no acute abnormality. Peripheral calcifications about the spleen noted. Adrenals/Urinary Tract: Adrenal glands are normal. Kidneys equal in size with symmetric enhancement. No nephrolithiasis, hydronephrosis, or focal enhancing renal mass. Ureters within normal limits for caliber without acute abnormality. Bladder within normal limits. Stomach/Bowel: Stomach within normal limits. No evidence for bowel obstruction. Appendix within normal limits. No acute inflammatory changes seen about the bowels. Anastomotic suture noted about the colon. Vascular/Lymphatic: Moderate to advanced aorto bi-iliac  atherosclerotic disease. No aneurysm. No adenopathy. Reproductive:  Prostate within normal limits. Other: No free air or fluid. Fat containing left ventral hernia again noted, stable. Musculoskeletal: No acute osseous abnormality. No worrisome lytic or blastic osseous lesions. Moderate to advanced degenerative spondylolysis noted within the lumbar spine. IMPRESSION: 1. Status post cholecystectomy. Enlargement of the common bile duct to 14 mm with a least 2 small stones within the duct itself as detailed above. Associated mild pancreatic ductal dilatation as well. 2. Small bilateral pleural effusions, left greater than right, with similar right basilar pleural plaque. Electronically Signed   By: Jeannine Boga M.D.   On: 06/06/2016 20:13   Dg Chest Port 1 View  Result Date: 06/06/2016 CLINICAL DATA:  Right upper quadrant pain.  Shortness of breath. EXAM: PORTABLE CHEST 1 VIEW COMPARISON:  Chest radiographs 12/03/2015. Lung bases from CT abdomen/ pelvis earlier this day. FINDINGS: Small left pleural effusion with adjacent atelectasis. A small right pleural effusion on CT is not seen radiographically. Low pleural plaque about the right diaphragm is not well visualized. Patient is post median sternotomy. Heart size upper normal. No pulmonary edema. There is biapical pleural parenchymal scarring. No pneumothorax. IMPRESSION: Small left pleural effusion with adjacent atelectasis. Right pleural effusion on CT is not visualized radiographically. The effusions have decreased from prior chest radiograph of April 2017. Electronically Signed   By: Jeb Levering M.D.   On: 06/06/2016 21:51  "  I personally reviewed the radiologic studies    ED Course: * Patient's differential included postoperative issue was with his cholecystectomy such as a bowel obstruction, etc. He appears per CAT scan that he has dilation of the common bile duct and 2 small stones that are likely causing his discomfort as he is pretty  consistent that his pain is in the right upper quadrant. The patient's afebrile though has an elevated white blood cell count. I discussed the case briefly with our surgeons on call here and the patient was recommended for ERCP. We currently do not have any gastroenterology coverage and the patient require transfer to [facility] and I spoke to GI doctor Ardis Hughs along with the hospitalist team who have accepted the patient in transfer Patient had some mild hypoxia while here in  the emergency department which I thought might be pain medication related. He was placed on a 2 L nasal cannula for comfort. He does have some small amount of lower lobe  effusion that appears to be old Clinical Course     Assessment:  Choledocholithiasis      Plan: * Transfer to [place], MD 06/06/16 2240

## 2016-06-07 ENCOUNTER — Inpatient Hospital Stay (HOSPITAL_COMMUNITY)
Admission: AD | Admit: 2016-06-07 | Discharge: 2016-06-09 | DRG: 445 | Disposition: A | Discharge status: Home / Self Care | Payer: Medicare Other | Source: Other Acute Inpatient Hospital | Attending: Internal Medicine | Admitting: Internal Medicine

## 2016-06-07 ENCOUNTER — Encounter (HOSPITAL_COMMUNITY): Payer: Self-pay | Admitting: Internal Medicine

## 2016-06-07 DIAGNOSIS — K219 Gastro-esophageal reflux disease without esophagitis: Secondary | ICD-10-CM | POA: Diagnosis present

## 2016-06-07 DIAGNOSIS — R1011 Right upper quadrant pain: Secondary | ICD-10-CM

## 2016-06-07 DIAGNOSIS — Z7982 Long term (current) use of aspirin: Secondary | ICD-10-CM | POA: Diagnosis not present

## 2016-06-07 DIAGNOSIS — I252 Old myocardial infarction: Secondary | ICD-10-CM

## 2016-06-07 DIAGNOSIS — R109 Unspecified abdominal pain: Secondary | ICD-10-CM | POA: Diagnosis present

## 2016-06-07 DIAGNOSIS — E785 Hyperlipidemia, unspecified: Secondary | ICD-10-CM | POA: Diagnosis present

## 2016-06-07 DIAGNOSIS — D649 Anemia, unspecified: Secondary | ICD-10-CM | POA: Diagnosis present

## 2016-06-07 DIAGNOSIS — K269 Duodenal ulcer, unspecified as acute or chronic, without hemorrhage or perforation: Secondary | ICD-10-CM | POA: Diagnosis present

## 2016-06-07 DIAGNOSIS — Z8249 Family history of ischemic heart disease and other diseases of the circulatory system: Secondary | ICD-10-CM

## 2016-06-07 DIAGNOSIS — K2981 Duodenitis with bleeding: Secondary | ICD-10-CM | POA: Diagnosis not present

## 2016-06-07 DIAGNOSIS — I130 Hypertensive heart and chronic kidney disease with heart failure and stage 1 through stage 4 chronic kidney disease, or unspecified chronic kidney disease: Secondary | ICD-10-CM | POA: Diagnosis present

## 2016-06-07 DIAGNOSIS — K8050 Calculus of bile duct without cholangitis or cholecystitis without obstruction: Secondary | ICD-10-CM | POA: Diagnosis not present

## 2016-06-07 DIAGNOSIS — Z801 Family history of malignant neoplasm of trachea, bronchus and lung: Secondary | ICD-10-CM | POA: Diagnosis not present

## 2016-06-07 DIAGNOSIS — K8042 Calculus of bile duct with acute cholecystitis without obstruction: Secondary | ICD-10-CM | POA: Diagnosis present

## 2016-06-07 DIAGNOSIS — Z8049 Family history of malignant neoplasm of other genital organs: Secondary | ICD-10-CM

## 2016-06-07 DIAGNOSIS — N189 Chronic kidney disease, unspecified: Secondary | ICD-10-CM | POA: Diagnosis present

## 2016-06-07 DIAGNOSIS — Z951 Presence of aortocoronary bypass graft: Secondary | ICD-10-CM

## 2016-06-07 DIAGNOSIS — I2510 Atherosclerotic heart disease of native coronary artery without angina pectoris: Secondary | ICD-10-CM

## 2016-06-07 DIAGNOSIS — Z85038 Personal history of other malignant neoplasm of large intestine: Secondary | ICD-10-CM | POA: Diagnosis not present

## 2016-06-07 DIAGNOSIS — Z79899 Other long term (current) drug therapy: Secondary | ICD-10-CM

## 2016-06-07 DIAGNOSIS — Z01810 Encounter for preprocedural cardiovascular examination: Secondary | ICD-10-CM | POA: Diagnosis not present

## 2016-06-07 DIAGNOSIS — Z87891 Personal history of nicotine dependence: Secondary | ICD-10-CM

## 2016-06-07 DIAGNOSIS — K5900 Constipation, unspecified: Secondary | ICD-10-CM | POA: Diagnosis present

## 2016-06-07 DIAGNOSIS — Z9049 Acquired absence of other specified parts of digestive tract: Secondary | ICD-10-CM | POA: Diagnosis not present

## 2016-06-07 DIAGNOSIS — Z85118 Personal history of other malignant neoplasm of bronchus and lung: Secondary | ICD-10-CM

## 2016-06-07 LAB — CBC WITH DIFFERENTIAL/PLATELET
Basophils Absolute: 0 K/uL (ref 0.0–0.1)
Basophils Relative: 0 %
Eosinophils Absolute: 0 K/uL (ref 0.0–0.7)
Eosinophils Relative: 0 %
HCT: 32.4 % — ABNORMAL LOW (ref 39.0–52.0)
Hemoglobin: 11.1 g/dL — ABNORMAL LOW (ref 13.0–17.0)
Lymphocytes Relative: 11 %
Lymphs Abs: 1 K/uL (ref 0.7–4.0)
MCH: 32.2 pg (ref 26.0–34.0)
MCHC: 34.3 g/dL (ref 30.0–36.0)
MCV: 93.9 fL (ref 78.0–100.0)
Monocytes Absolute: 0.9 K/uL (ref 0.1–1.0)
Monocytes Relative: 9 %
Neutro Abs: 7.4 K/uL (ref 1.7–7.7)
Neutrophils Relative %: 80 %
Platelets: 191 K/uL (ref 150–400)
RBC: 3.45 MIL/uL — ABNORMAL LOW (ref 4.22–5.81)
RDW: 14.2 % (ref 11.5–15.5)
WBC: 9.4 K/uL (ref 4.0–10.5)

## 2016-06-07 LAB — BASIC METABOLIC PANEL
ANION GAP: 9 (ref 5–15)
BUN: 12 mg/dL (ref 6–20)
CO2: 25 mmol/L (ref 22–32)
Calcium: 8.7 mg/dL — ABNORMAL LOW (ref 8.9–10.3)
Chloride: 104 mmol/L (ref 101–111)
Creatinine, Ser: 1.04 mg/dL (ref 0.61–1.24)
GFR calc Af Amer: >60 mL/min (ref >60)
GFR calc non Af Amer: >60 mL/min (ref >60)
GLUCOSE: 121 mg/dL — AB (ref 65–99)
POTASSIUM: 3.9 mmol/L (ref 3.5–5.1)
Sodium: 138 mmol/L (ref 135–145)

## 2016-06-07 LAB — HEPATIC FUNCTION PANEL
ALT: 14 U/L — ABNORMAL LOW (ref 17–63)
AST: 26 U/L (ref 15–41)
Albumin: 3.7 g/dL (ref 3.5–5.0)
Alkaline Phosphatase: 80 U/L (ref 38–126)
Bilirubin, Direct: 0.1 mg/dL (ref 0.1–0.5)
Indirect Bilirubin: 0.5 mg/dL (ref 0.3–0.9)
Total Bilirubin: 0.6 mg/dL (ref 0.3–1.2)
Total Protein: 7.2 g/dL (ref 6.5–8.1)

## 2016-06-07 LAB — LIPASE, BLOOD: Lipase: 32 U/L (ref 11–51)

## 2016-06-07 LAB — TROPONIN I
Troponin I: 1.17 ng/mL
Troponin I: 1.17 ng/mL (ref <0.03)
Troponin I: 0.89 ng/mL (ref <0.03)

## 2016-06-07 MED ORDER — METOPROLOL TARTRATE 5 MG/5ML IV SOLN
5.0000 mg | Freq: Four times a day (QID) | INTRAVENOUS | Status: DC
Start: 2016-06-07 — End: 2016-06-08
  Administered 2016-06-07 – 2016-06-08 (×4): 5 mg via INTRAVENOUS
  Filled 2016-06-07 (×6): qty 5

## 2016-06-07 MED ORDER — ACETAMINOPHEN 325 MG PO TABS: 650.0000 mg | ORAL_TABLET | Freq: Four times a day (QID) | ORAL | Status: DC | PRN

## 2016-06-07 MED ORDER — HYDRALAZINE HCL 20 MG/ML IJ SOLN: 10.0000 mg | INTRAMUSCULAR | Status: DC | PRN

## 2016-06-07 MED ORDER — ONDANSETRON HCL 4 MG PO TABS
4.0000 mg | ORAL_TABLET | Freq: Four times a day (QID) | ORAL | Status: DC | PRN
  Administered 2016-06-07: 4 mg via ORAL
  Filled 2016-06-07: qty 1

## 2016-06-07 MED ORDER — HYDROMORPHONE HCL 1 MG/ML IJ SOLN
1.0000 mg | INTRAMUSCULAR | Status: DC | PRN
  Administered 2016-06-07 – 2016-06-09 (×3): 1 mg via INTRAVENOUS
  Filled 2016-06-07 (×3): qty 1

## 2016-06-07 MED ORDER — PIPERACILLIN-TAZOBACTAM 3.375 G IVPB
3.3750 g | Freq: Three times a day (TID) | INTRAVENOUS | Status: DC
Start: 2016-06-07 — End: 2016-06-07
  Administered 2016-06-07: 3.375 g via INTRAVENOUS
  Filled 2016-06-07 (×3): qty 50

## 2016-06-07 MED ORDER — ONDANSETRON HCL 4 MG/2ML IJ SOLN: 4.0000 mg | Freq: Four times a day (QID) | INTRAMUSCULAR | Status: DC | PRN

## 2016-06-07 MED ORDER — ACETAMINOPHEN 650 MG RE SUPP: 650.0000 mg | Freq: Four times a day (QID) | RECTAL | Status: DC | PRN

## 2016-06-07 MED ORDER — DEXTROSE-NACL 5-0.9 % IV SOLN
INTRAVENOUS | Status: AC
  Administered 2016-06-07 (×2): via INTRAVENOUS

## 2016-06-07 NOTE — Progress Notes (Signed)
PROGRESS NOTE    Preston Bell  [id number] DOB: 01/23/1941 DOA: 06/07/2016 PCP: Pcp Not In System    Brief Narrative: HPI: Preston Bell is a 75 y.o. male with CAD status post CABG recently, had cholecystectomy 2 months ago presented to the ER at [facility] with complaints of multiple episodes of nausea vomiting and epigastric discomfort since yesterday morning. Denies any chest pain or fever chills or shortness of breath. CT scan of the abdomen and pelvis shows CBD stones and patient was transferred to [facility] for further GI workup since there was no gastroenterologist available at [facility]. On my exam patient is not in distress and has not had any further episodes of vomiting since patient has not eaten anything.   ED Course: CT scan showing CBD stones.  Assessment & Plan:   Principal Problem:   Abdominal pain Active Problems:   CAD, multiple vessel; 90% LM, 90% ostLAD, 90% OstD2, 80% ostCx, 95% mRCA   Hyperlipidemia   S/P CABG x 3   1-Right upper quadrant pain, nausea, vomiting;  CT with CBD stone.  GI consulted for possible ERCP.  Troponin elevated, cardiology consulted for clearance.   2-CAD, s/p STEMI, S/P CABG Feb 2017;  He is chest pain free, denies dyspnea.  Troponin elevated.  Repeat EKG.  Cardiology Consulted.  ON IV metoprolol.     DVT prophylaxis: SCD.  Code Status: Full code.  Family Communication: daughter at bedside.  Disposition Plan:  Remain inpatient.  Consultants:  Cardio GI    Procedures: none   Antimicrobials: none   Subjective: Feeling ok, denies dyspnea, chest pain.  Report Right upper quadrant pain and nausea.   Objective: Vitals:   06/07/16 0020 06/07/16 0542  BP: (!) 149/80 139/84  Pulse: (!) 107 (!) 111  Resp: 17 17  Temp: 97.7 F (36.5 C) 98.5 F (36.9 C)  TempSrc: Oral Oral  SpO2: 90% 93%    Intake/Output Summary (Last 24 hours) at 06/07/16 1007 Last data filed at 06/07/16 0800  Gross per 24 hour  Intake               300 ml  Output              500 ml  Net             -200 ml   There were no vitals filed for this visit.  Examination:  General exam: Appears calm and comfortable  Respiratory system: Clear to auscultation. Respiratory effort normal. Cardiovascular system: S1 & S2 heard, RRR. No JVD, murmurs, rubs, gallops or clicks. No pedal edema. Gastrointestinal system: Abdomen is nondistended, soft and nontender. No organomegaly or masses felt. Normal bowel sounds heard. Central nervous system: Alert and oriented. No focal neurological deficits. Extremities: Symmetric 5 x 5 power. Skin: No rashes, lesions or ulcers Psychiatry: Judgement and insight appear normal. Mood & affect appropriate.     Data Reviewed: I have personally reviewed following labs and imaging studies  CBC:  Recent Labs Lab 06/06/16 1817 06/07/16 0608  WBC 12.4* 9.4  NEUTROABS 8.9* 7.4  HGB 12.5* 11.1*  HCT 35.6* 32.4*  MCV 95.2 93.9  PLT 211 [id number]   Basic Metabolic Panel:  Recent Labs Lab 06/06/16 1817 06/07/16 0608  NA 139 138  K 3.7 3.9  CL 106 104  CO2 23 25  GLUCOSE 109* 121*  BUN 16 12  CREATININE 1.12 1.04  CALCIUM 9.5 8.7*   GFR: Estimated Creatinine Clearance: 65.1 mL/min (by C-G formula  based on SCr of 1.04 mg/dL). Liver Function Tests:  Recent Labs Lab 06/06/16 1817 06/07/16 0608  AST 26 26  ALT 15* 14*  ALKPHOS 93 80  BILITOT 0.7 0.6  PROT 9.1* 7.2  ALBUMIN 4.7 3.7    Recent Labs Lab 06/06/16 1817 06/07/16 0608  LIPASE 92* 32   No results for input(s): AMMONIA in the last 168 hours. Coagulation Profile: No results for input(s): INR, PROTIME in the last 168 hours. Cardiac Enzymes:  Recent Labs Lab 06/07/16 0608  TROPONINI 1.17*   BNP (last 3 results) No results for input(s): PROBNP in the last 8760 hours. HbA1C: No results for input(s): HGBA1C in the last 72 hours. CBG: No results for input(s): GLUCAP in the last 168 hours. Lipid Profile: No results for  input(s): CHOL, HDL, LDLCALC, TRIG, CHOLHDL, LDLDIRECT in the last 72 hours. Thyroid Function Tests: No results for input(s): TSH, T4TOTAL, FREET4, T3FREE, THYROIDAB in the last 72 hours. Anemia Panel: No results for input(s): VITAMINB12, FOLATE, FERRITIN, TIBC, IRON, RETICCTPCT in the last 72 hours. Sepsis Labs: No results for input(s): PROCALCITON, LATICACIDVEN in the last 168 hours.  No results found for this or any previous visit (from the past 240 hour(s)).       Radiology Studies: Ct Abdomen Pelvis W Contrast  Result Date: 06/06/2016 CLINICAL DATA:  Initial evaluation for acute right upper quadrant pain. EXAM: CT ABDOMEN AND PELVIS WITH CONTRAST TECHNIQUE: Multidetector CT imaging of the abdomen and pelvis was performed using the standard protocol following bolus administration of intravenous contrast. CONTRAST:  123mL ISOVUE-300 IOPAMIDOL (ISOVUE-300) INJECTION 61% COMPARISON:  Prior CT from 10/05/2015. FINDINGS: Lower chest: Bilateral pleural effusions, left greater than right. Associated bibasilar atelectasis. Irregular pleural plaque again noted at the right hemidiaphragm with associated soft tissue thickening measuring approximately 4.1 x 2.3 cm, similar to previous. No pericardial effusion. Mild cardiomegaly noted. Hepatobiliary: Liver demonstrates a normal contrast enhanced appearance. Patient is status post cholecystectomy. Suture material present at the gallbladder fossa. There is dilatation of the common bile duct somewhat greater than expected for normal post cholecystectomy changes, measuring up to 14 mm in diameter. 11 mm irregular hyperdensity at the proximal aspect of the common bile duct near the suture material suspicious for possible stone (series 2, image 26). An additional 6 mm stone seen layering dependently within the distal duct (series 5, image 44). Mild intrahepatic biliary dilatation noted as well. Distal pancreatic duct also mildly dilated up to 7 mm. No definite  mass lesion identified. Pancreas: Other than the aforementioned ductal dilatation, pancreas is otherwise unremarkable without acute inflammatory changes. Spleen: Spleen demonstrates no acute abnormality. Peripheral calcifications about the spleen noted. Adrenals/Urinary Tract: Adrenal glands are normal. Kidneys equal in size with symmetric enhancement. No nephrolithiasis, hydronephrosis, or focal enhancing renal mass. Ureters within normal limits for caliber without acute abnormality. Bladder within normal limits. Stomach/Bowel: Stomach within normal limits. No evidence for bowel obstruction. Appendix within normal limits. No acute inflammatory changes seen about the bowels. Anastomotic suture noted about the colon. Vascular/Lymphatic: Moderate to advanced aorto bi-iliac atherosclerotic disease. No aneurysm. No adenopathy. Reproductive:  Prostate within normal limits. Other: No free air or fluid. Fat containing left ventral hernia again noted, stable. Musculoskeletal: No acute osseous abnormality. No worrisome lytic or blastic osseous lesions. Moderate to advanced degenerative spondylolysis noted within the lumbar spine. IMPRESSION: 1. Status post cholecystectomy. Enlargement of the common bile duct to 14 mm with a least 2 small stones within the duct itself as detailed above. Associated  mild pancreatic ductal dilatation as well. 2. Small bilateral pleural effusions, left greater than right, with similar right basilar pleural plaque. Electronically Signed   By: Jeannine Boga M.D.   On: 06/06/2016 20:13   Dg Chest Port 1 View  Result Date: 06/06/2016 CLINICAL DATA:  Right upper quadrant pain.  Shortness of breath. EXAM: PORTABLE CHEST 1 VIEW COMPARISON:  Chest radiographs 12/03/2015. Lung bases from CT abdomen/ pelvis earlier this day. FINDINGS: Small left pleural effusion with adjacent atelectasis. A small right pleural effusion on CT is not seen radiographically. Low pleural plaque about the right  diaphragm is not well visualized. Patient is post median sternotomy. Heart size upper normal. No pulmonary edema. There is biapical pleural parenchymal scarring. No pneumothorax. IMPRESSION: Small left pleural effusion with adjacent atelectasis. Right pleural effusion on CT is not visualized radiographically. The effusions have decreased from prior chest radiograph of April 2017. Electronically Signed   By: Jeb Levering M.D.   On: 06/06/2016 21:51        Scheduled Meds: . metoprolol  5 mg Intravenous Q6H  . piperacillin-tazobactam (ZOSYN)  IV  3.375 g Intravenous Q8H   Continuous Infusions: . dextrose 5 % and 0.9% NaCl 100 mL/hr at 06/07/16 0706     LOS: 0 days    Time spent: 35 minutes.     Elmarie Shiley, MD [facility] Pager [phone]  If 7PM-7AM, please contact night-coverage [email or web address] Password [email or web address] 06/07/2016, 10:07 AM

## 2016-06-07 NOTE — Consult Note (Signed)
Patient ID: Preston Bell MRN: [id number], DOB/AGE: [birth year]   Admit date: 06/07/2016   Reason for Consult: Surgical Clearance Requesting MD: Dr. Carlean Purl, GI; Dr. Hal Hope, IM    Primary Physician: Pcp Not In System Primary Cardiologist: Dr. Fletcher Anon   Pt. Profile:  75 y/o male with h/o symptomatic gallbladder disease and CAD s/p NSTEMI in Feb. 2017, resulting in CABG x 3 ( LIMA-LAD; SVG-OM; SVG-RCA). Normal LVEF. He presented 06/07/16 with RUQ pain + n/v and found to have CBD stones on abdominal CT. Plans are for ERCP and stone removal under general anesthesia, per GI. Cardiology has been consulted for surgical clearance.    Problem List  Past Medical History:  Diagnosis Date  . Acute diastolic heart failure (HCC) - EDP severely elevated @ Cath post NSTEMI 10/11/2015  . Alcohol abuse   . Anemia   . Anxiety   . 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 Cancer ([place])    Lung and Colon  . CHF (congestive heart failure) ([place])   . Chronic kidney disease    Acute Renal Failure, resolved  . Gallstone pancreatitis   . GERD (gastroesophageal reflux disease)   . History of colectomy   . HLD (hyperlipidemia)   . Hypercalcemia   . Hypertension   . NSTEMI (non-ST elevated myocardial infarction) ([place])   . Panic disorder   . Pleural effusion    Right Lung  . Sepsis ([place])   . SVT (supraventricular tachycardia) (HCC)     Past Surgical History:  Procedure Laterality Date  . CARDIAC CATHETERIZATION N/A 10/09/2015   Procedure: Left Heart Cath and Coronary Angiography;  Surgeon: Wellington Hampshire, MD;  Location: [facility] [facility];  Service: Cardiovascular;  Laterality: N/A;  . CHOLECYSTECTOMY N/A 03/27/2016   Procedure: LAPAROSCOPIC CHOLECYSTECTOMY WITH INTRAOPERATIVE CHOLANGIOGRAM;  Surgeon: Clayburn Pert, MD;  Location: [facility];  Service: General;  Laterality: N/A;  . COLON SURGERY  2012   Colectomy ([place])  . COLOSTOMY  REVERSAL  2013  . CORONARY ARTERY BYPASS GRAFT N/A 10/13/2015   Procedure: CORONARY ARTERY BYPASS GRAFTING (CABG), ON PUMP, TIMES THREE, USING LEFT INTERNAL MAMMARY ARTERY, RIGHT GREATER SAPHENOUS VEIN HARVESTED ENDOSCOPICALLY;  Surgeon: Ivin Poot, MD;  Location: [place];  Service: Open Heart Surgery;  Laterality: N/A;  LIMA-LAD; SVG-OM; SVG-RCA  . CORONARY ARTERY BYPASS GRAFT     x 3 Vessels  . KNEE ARTHROSCOPY     Right-2011 left-2012  . TEE WITHOUT CARDIOVERSION N/A 10/13/2015   Procedure: TRANSESOPHAGEAL ECHOCARDIOGRAM (TEE);  Surgeon: Ivin Poot, MD;  Location: [place];  Service: Open Heart Surgery;  Laterality: N/A;  . THORACENTESIS       Allergies  No Known Allergies  HPI  75 y/o male, followed by Dr. Fletcher Anon. He has a h/o CABG in February 2017 after he presented with a non-ST elevation myocardial infarction in the setting of acute cholecystitis. Cardiac catheterization at that time showed severe left main and three-vessel coronary artery disease. Ejection fraction was normal. he underwent CABG x 3 LIMA-LAD; SVG-OM; SVG-RCA. Cholecystitis was treated with percutaneous drainage.  He underwent successful cholecystectomy in August without complications.   He presented to the ED earlier today, from [facility], with compliant of RUQ pain and nausea/ vomiting. CT scan of the abdomen and pelvis showed CBD stones. He was placed on antibiotics and bowel rest. GI has been consulted. Plan is for ERCP and stone removal under general anesthesia. Cardiology  has been consulted for surgical clearance.   From a cardiac standpoint, he notes that he has done well. He denies any anginal symptomatology. He is able to ambulate up a flight of stairs w/o exertional CP or dyspnea. EKG shows sinus tach with rate of 103 bpm but no ischemia.   Home Medications  Prior to Admission medications   Medication Sig Start Date End Date Taking? Authorizing Provider  aspirin EC 325 MG EC tablet Take 1 tablet (325 mg  total) by mouth daily. 10/19/15   John Giovanni, PA-C  atorvastatin (LIPITOR) 20 MG tablet Take 1 tablet (20 mg total) by mouth every morning. 10/19/15 10/18/16  John Giovanni, PA-C  esomeprazole (NEXIUM) 40 MG capsule Take 40 mg by mouth daily.    Historical Provider, MD  HYDROcodone-acetaminophen (NORCO/VICODIN) 5-325 MG tablet Take 1-2 tablets by mouth every 4 (four) hours as needed for moderate pain. 03/28/16   Clayburn Pert, MD  lisinopril (PRINIVIL,ZESTRIL) 20 MG tablet Take 1 tablet (20 mg total) by mouth daily. 02/29/16   Wellington Hampshire, MD  metoprolol (LOPRESSOR) 50 MG tablet Take 1 tablet (50 mg total) by mouth 2 (two) times daily. 10/19/15   Wayne E Gold, PA-C  mirtazapine (REMERON) 15 MG tablet Take 15 mg by mouth at bedtime. 06/14/15 06/13/16  Historical Provider, MD    Family History  Family History  Problem Relation Age of Onset  . Cervical cancer Mother   . Lung cancer Father   . Lung cancer Brother   . Hypertension Brother   . Hypertension Son     Social History  Social History   Social History  . Marital status: Legally Separated    Spouse name: N/A  . Number of children: N/A  . Years of education: N/A   Occupational History  . Not on file.   Social History Main Topics  . Smoking status: Former Smoker    Packs/day: 1.00    Years: 30.00    Quit date: 10/06/1983  . Smokeless tobacco: Never Used  . 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 Drug use:     Frequency: 3.0 times per week    Types: Marijuana     Comment: one month ago  . Sexual activity: Not Currently   Other Topics Concern  . Not on file   Social History Narrative  . No narrative on file     Review of Systems General:  No chills, fever, night sweats or weight changes.  Cardiovascular:  No chest pain, dyspnea on exertion, edema, orthopnea, palpitations, paroxysmal nocturnal dyspnea. Dermatological: No rash, lesions/masses Respiratory: No cough,  dyspnea Urologic: No hematuria, dysuria Abdominal:   No nausea, vomiting, diarrhea, bright red blood per rectum, melena, or hematemesis Neurologic:  No visual changes, wkns, changes in mental status. All other systems reviewed and are otherwise negative except as noted above.  Physical Exam  Blood pressure 139/84, pulse (!) 111, temperature 98.5 F (36.9 C), temperature source Oral, resp. rate 17, SpO2 93 %.  General: Pleasant, NAD Psych: Normal affect. Neuro: Alert and oriented X 3. Moves all extremities spontaneously. HEENT: Normal  Neck: Supple without bruits or JVD. Lungs:  Resp regular and unlabored, CTA. Heart: RRR no s3, s4, or murmurs. Abdomen: Soft, non-tender, non-distended, BS + x 4.  Extremities: No clubbing, cyanosis or edema. DP/PT/Radials 2+ and equal bilaterally.  Labs  Troponin (Point of Care Test) No results for input(s): TROPIPOC in  the last 72 hours.  Recent Labs  06/07/16 0608  TROPONINI 1.17*   Lab Results  Component Value Date   WBC 9.4 06/07/2016   HGB 11.1 (L) 06/07/2016   HCT 32.4 (L) 06/07/2016   MCV 93.9 06/07/2016   PLT 191 06/07/2016     Recent Labs Lab 06/07/16 0608  NA 138  K 3.9  CL 104  CO2 25  BUN 12  CREATININE 1.04  CALCIUM 8.7*  PROT 7.2  BILITOT 0.6  ALKPHOS 80  ALT 14*  AST 26  GLUCOSE 121*   Lab Results  Component Value Date   CHOL 84 10/13/2015   HDL 15 (L) 10/13/2015   LDLCALC 46 10/13/2015   TRIG 117 10/13/2015   No results found for: DDIMER   Radiology/Studies  Ct Abdomen Pelvis W Contrast  Result Date: 06/06/2016 CLINICAL DATA:  Initial evaluation for acute right upper quadrant pain. EXAM: CT ABDOMEN AND PELVIS WITH CONTRAST TECHNIQUE: Multidetector CT imaging of the abdomen and pelvis was performed using the standard protocol following bolus administration of intravenous contrast. CONTRAST:  172mL ISOVUE-300 IOPAMIDOL (ISOVUE-300) INJECTION 61% COMPARISON:  Prior CT from 10/05/2015. FINDINGS: Lower  chest: Bilateral pleural effusions, left greater than right. Associated bibasilar atelectasis. Irregular pleural plaque again noted at the right hemidiaphragm with associated soft tissue thickening measuring approximately 4.1 x 2.3 cm, similar to previous. No pericardial effusion. Mild cardiomegaly noted. Hepatobiliary: Liver demonstrates a normal contrast enhanced appearance. Patient is status post cholecystectomy. Suture material present at the gallbladder fossa. There is dilatation of the common bile duct somewhat greater than expected for normal post cholecystectomy changes, measuring up to 14 mm in diameter. 11 mm irregular hyperdensity at the proximal aspect of the common bile duct near the suture material suspicious for possible stone (series 2, image 26). An additional 6 mm stone seen layering dependently within the distal duct (series 5, image 44). Mild intrahepatic biliary dilatation noted as well. Distal pancreatic duct also mildly dilated up to 7 mm. No definite mass lesion identified. Pancreas: Other than the aforementioned ductal dilatation, pancreas is otherwise unremarkable without acute inflammatory changes. Spleen: Spleen demonstrates no acute abnormality. Peripheral calcifications about the spleen noted. Adrenals/Urinary Tract: Adrenal glands are normal. Kidneys equal in size with symmetric enhancement. No nephrolithiasis, hydronephrosis, or focal enhancing renal mass. Ureters within normal limits for caliber without acute abnormality. Bladder within normal limits. Stomach/Bowel: Stomach within normal limits. No evidence for bowel obstruction. Appendix within normal limits. No acute inflammatory changes seen about the bowels. Anastomotic suture noted about the colon. Vascular/Lymphatic: Moderate to advanced aorto bi-iliac atherosclerotic disease. No aneurysm. No adenopathy. Reproductive:  Prostate within normal limits. Other: No free air or fluid. Fat containing left ventral hernia again noted,  stable. Musculoskeletal: No acute osseous abnormality. No worrisome lytic or blastic osseous lesions. Moderate to advanced degenerative spondylolysis noted within the lumbar spine. IMPRESSION: 1. Status post cholecystectomy. Enlargement of the common bile duct to 14 mm with a least 2 small stones within the duct itself as detailed above. Associated mild pancreatic ductal dilatation as well. 2. Small bilateral pleural effusions, left greater than right, with similar right basilar pleural plaque. Electronically Signed   By: Jeannine Boga M.D.   On: 06/06/2016 20:13   Dg Chest Port 1 View  Result Date: 06/06/2016 CLINICAL DATA:  Right upper quadrant pain.  Shortness of breath. EXAM: PORTABLE CHEST 1 VIEW COMPARISON:  Chest radiographs 12/03/2015. Lung bases from CT abdomen/ pelvis earlier this day. FINDINGS: Small left pleural  effusion with adjacent atelectasis. A small right pleural effusion on CT is not seen radiographically. Low pleural plaque about the right diaphragm is not well visualized. Patient is post median sternotomy. Heart size upper normal. No pulmonary edema. There is biapical pleural parenchymal scarring. No pneumothorax. IMPRESSION: Small left pleural effusion with adjacent atelectasis. Right pleural effusion on CT is not visualized radiographically. The effusions have decreased from prior chest radiograph of April 2017. Electronically Signed   By: Jeb Levering M.D.   On: 06/06/2016 21:51    ECG  Sinus tach 103 bpm. No ischemia.   ASSESSMENT AND PLAN  Principal Problem:   Abdominal pain Active Problems:   CAD, multiple vessel; 90% LM, 90% ostLAD, 90% OstD2, 80% ostCx, 95% mRCA   Hyperlipidemia   S/P CABG x 3  1. CAD: s/p CABG x 3 08/2015. LIMA-LAD; SVG-OM; SVG-RCA. Stable w/o anginal symptoms. On ASA, statin, BB and ACE-I as an outpatient.   2. CBD Stones: plan for ERCP with stone removal per Dr. Carlean Purl.  3. Pre-operative Assessment: patient has known coronary  disease, s/p CABG in January. He denies any anginal symptomology. He is able to ambulate a flight of stairs w/o exertional chest pain or dyspnea. His EKG shows no ischemia. No further cardiac testing w/u indicated prior to surgery. He is ok to proceed with out further w/u. Continue his beta blocker during the perioperative period. Ok to hold ASA temporarily. Resume once ok with GI. Cardiology will be available if needed post op.    Signed, Lyda Jester, PA-C 06/07/2016, 11:57 AM  Pager: [phone]  History and all data above reviewed.  Patient examined.  I agree with the findings as above.  The patient exam reveals COR:RRR  ,  Lungs: Clear  ,  Abd: Positive bowel sounds, no rebound no guarding, Ext No edema  .  All available labs, radiology testing, previous records reviewed. Agree with documented assessment and plan. The patient had recent revascularization.  He has is active with a high functional level.  He has no high risk symptoms or findings.  Therefore, based on ACC/AHA guidelines, the patient would be at acceptable risk for the planned procedure without further cardiovascular testing.  Jeneen Rinks Yury Schaus  12:22 PM  06/07/2016

## 2016-06-07 NOTE — H&P (Signed)
History and Physical    Preston Bell [id number] DOB: [birth year] DOA: 06/07/2016  PCP: Pcp Not In System  Patient coming from: Transferred from [facility].  Chief Complaint: Nausea vomiting abdominal pain.  HPI: Preston Bell is a 75 y.o. male with CAD status post CABG recently, had cholecystectomy 2 months ago presented to the ER at [facility] with complaints of multiple episodes of nausea vomiting and epigastric discomfort since yesterday morning. Denies any chest pain or fever chills or shortness of breath. CT scan of the abdomen and pelvis shows CBD stones and patient was transferred to [facility] for further GI workup since there was no gastroenterologist available at [facility]. On my exam patient is not in distress and has not had any further episodes of vomiting since patient has not eaten anything.   ED Course: CT scan showing CBD stones.  Review of Systems: As per HPI, rest all negative.   Past Medical History:  Diagnosis Date  . Acute diastolic heart failure (HCC) - EDP severely elevated @ Cath post NSTEMI 10/11/2015  . Alcohol abuse   . Anemia   . Anxiety   . 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 Cancer ([place])    Lung and Colon  . CHF (congestive heart failure) ([place])   . Chronic kidney disease    Acute Renal Failure, resolved  . Gallstone pancreatitis   . GERD (gastroesophageal reflux disease)   . History of colectomy   . HLD (hyperlipidemia)   . Hypercalcemia   . Hypertension   . NSTEMI (non-ST elevated myocardial infarction) ([place])   . Panic disorder   . Pleural effusion    Right Lung  . Sepsis ([place])   . SVT (supraventricular tachycardia) (HCC)     Past Surgical History:  Procedure Laterality Date  . CARDIAC CATHETERIZATION N/A 10/09/2015   Procedure: Left Heart Cath and Coronary Angiography;  Surgeon: Wellington Hampshire, MD;  Location: [facility];  Service: Cardiovascular;  Laterality: N/A;  . CHOLECYSTECTOMY  N/A 03/27/2016   Procedure: LAPAROSCOPIC CHOLECYSTECTOMY WITH INTRAOPERATIVE CHOLANGIOGRAM;  Surgeon: Clayburn Pert, MD;  Location: [facility] ORS;  Service: General;  Laterality: N/A;  . COLON SURGERY  2012   Colectomy ([place] )  . COLOSTOMY REVERSAL  2013  . CORONARY ARTERY BYPASS GRAFT N/A 10/13/2015   Procedure: CORONARY ARTERY BYPASS GRAFTING (CABG), ON PUMP, TIMES THREE, USING LEFT INTERNAL MAMMARY ARTERY, RIGHT GREATER SAPHENOUS VEIN HARVESTED ENDOSCOPICALLY;  Surgeon: Ivin Poot, MD;  Location: [place];  Service: Open Heart Surgery;  Laterality: N/A;  LIMA-LAD; SVG-OM; SVG-RCA  . CORONARY ARTERY BYPASS GRAFT     x 3 Vessels  . KNEE ARTHROSCOPY     Right-2011 left-2012  . TEE WITHOUT CARDIOVERSION N/A 10/13/2015   Procedure: TRANSESOPHAGEAL ECHOCARDIOGRAM (TEE);  Surgeon: Ivin Poot, MD;  Location: [place];  Service: Open Heart Surgery;  Laterality: N/A;  . THORACENTESIS       reports that he quit smoking about 32 years ago. He has a 30.00 pack-year smoking history. He has never used smokeless tobacco. He reports that he uses drugs, including Marijuana, about 3 times per week. He reports that he does not drink alcohol.  No Known Allergies  Family History  Problem Relation Age of Onset  . Cervical cancer Mother   . Lung cancer Father   . Lung cancer Brother   . Hypertension Brother   . Hypertension Son     Prior  to Admission medications   Medication Sig Start Date End Date Taking? Authorizing Provider  aspirin EC 325 MG EC tablet Take 1 tablet (325 mg total) by mouth daily. 10/19/15   John Giovanni, PA-C  atorvastatin (LIPITOR) 20 MG tablet Take 1 tablet (20 mg total) by mouth every morning. 10/19/15 10/18/16  John Giovanni, PA-C  esomeprazole (NEXIUM) 40 MG capsule Take 40 mg by mouth daily.    Historical Provider, MD  HYDROcodone-acetaminophen (NORCO/VICODIN) 5-325 MG tablet Take 1-2 tablets by mouth every 4 (four) hours as needed for moderate pain. 03/28/16   Clayburn Pert, MD   lisinopril (PRINIVIL,ZESTRIL) 20 MG tablet Take 1 tablet (20 mg total) by mouth daily. 02/29/16   Wellington Hampshire, MD  metoprolol (LOPRESSOR) 50 MG tablet Take 1 tablet (50 mg total) by mouth 2 (two) times daily. 10/19/15   Wayne E Gold, PA-C  mirtazapine (REMERON) 15 MG tablet Take 15 mg by mouth at bedtime. 06/14/15 06/13/16  Historical Provider, MD    Physical Exam: Vitals:   06/07/16 0020  BP: (!) 149/80  Pulse: (!) 107  Resp: 17  Temp: 97.7 F (36.5 C)  TempSrc: Oral  SpO2: 90%      Constitutional: Moderately built and nourished. Vitals:   06/07/16 0020  BP: (!) 149/80  Pulse: (!) 107  Resp: 17  Temp: 97.7 F (36.5 C)  TempSrc: Oral  SpO2: 90%   Eyes: Anicteric no pallor. ENMT: No discharge from the ears eyes nose, mouth. Neck: No mass felt. No JVD appreciated. Respiratory: No rhonchi or crepitations. Cardiovascular: S1 and S2 heard. Abdomen: Soft nontender bowel sounds present. Musculoskeletal: No edema no joint effusion. Skin: No rash appears warm. Neurologic: Alert awake oriented to time place and person. Moves all extremities. Psychiatric: Appears normal normal affect.   Labs on Admission: I have personally reviewed following labs and imaging studies  CBC:  Recent Labs Lab 06/06/16 1817  WBC 12.4*  NEUTROABS 8.9*  HGB 12.5*  HCT 35.6*  MCV 95.2  PLT 123456   Basic Metabolic Panel:  Recent Labs Lab 06/06/16 1817  NA 139  K 3.7  CL 106  CO2 23  GLUCOSE 109*  BUN 16  CREATININE 1.12  CALCIUM 9.5   GFR: Estimated Creatinine Clearance: 60.5 mL/min (by C-G formula based on SCr of 1.12 mg/dL). Liver Function Tests:  Recent Labs Lab 06/06/16 1817  AST 26  ALT 15*  ALKPHOS 93  BILITOT 0.7  PROT 9.1*  ALBUMIN 4.7    Recent Labs Lab 06/06/16 1817  LIPASE 92*   No results for input(s): AMMONIA in the last 168 hours. Coagulation Profile: No results for input(s): INR, PROTIME in the last 168 hours. Cardiac Enzymes: No results for  input(s): CKTOTAL, CKMB, CKMBINDEX, TROPONINI in the last 168 hours. BNP (last 3 results) No results for input(s): PROBNP in the last 8760 hours. HbA1C: No results for input(s): HGBA1C in the last 72 hours. CBG: No results for input(s): GLUCAP in the last 168 hours. Lipid Profile: No results for input(s): CHOL, HDL, LDLCALC, TRIG, CHOLHDL, LDLDIRECT in the last 72 hours. Thyroid Function Tests: No results for input(s): TSH, T4TOTAL, FREET4, T3FREE, THYROIDAB in the last 72 hours. Anemia Panel: No results for input(s): VITAMINB12, FOLATE, FERRITIN, TIBC, IRON, RETICCTPCT in the last 72 hours. Urine analysis:    Component Value Date/Time   COLORURINE AMBER (A) 12/03/2015 1759   APPEARANCEUR HAZY (A) 12/03/2015 1759   APPEARANCEUR Clear 11/17/2013 0927   LABSPEC 1.019 12/03/2015 1759  LABSPEC 1.005 11/17/2013 0927   PHURINE 5.0 12/03/2015 1759   GLUCOSEU NEGATIVE 12/03/2015 1759   GLUCOSEU Negative 11/17/2013 0927   HGBUR NEGATIVE 12/03/2015 1759   BILIRUBINUR 1+ (A) 12/03/2015 1759   BILIRUBINUR Negative 11/17/2013 0927   KETONESUR NEGATIVE 12/03/2015 1759   PROTEINUR 30 (A) 12/03/2015 1759   NITRITE NEGATIVE 12/03/2015 1759   LEUKOCYTESUR 1+ (A) 12/03/2015 1759   LEUKOCYTESUR Negative 11/17/2013 0927   Sepsis Labs: @LABRCNTIP (procalcitonin:4,lacticidven:4) )No results found for this or any previous visit (from the past 240 hour(s)).   Radiological Exams on Admission: Ct Abdomen Pelvis W Contrast  Result Date: 06/06/2016 CLINICAL DATA:  Initial evaluation for acute right upper quadrant pain. EXAM: CT ABDOMEN AND PELVIS WITH CONTRAST TECHNIQUE: Multidetector CT imaging of the abdomen and pelvis was performed using the standard protocol following bolus administration of intravenous contrast. CONTRAST:  166mL ISOVUE-300 IOPAMIDOL (ISOVUE-300) INJECTION 61% COMPARISON:  Prior CT from 10/05/2015. FINDINGS: Lower chest: Bilateral pleural effusions, left greater than right.  Associated bibasilar atelectasis. Irregular pleural plaque again noted at the right hemidiaphragm with associated soft tissue thickening measuring approximately 4.1 x 2.3 cm, similar to previous. No pericardial effusion. Mild cardiomegaly noted. Hepatobiliary: Liver demonstrates a normal contrast enhanced appearance. Patient is status post cholecystectomy. Suture material present at the gallbladder fossa. There is dilatation of the common bile duct somewhat greater than expected for normal post cholecystectomy changes, measuring up to 14 mm in diameter. 11 mm irregular hyperdensity at the proximal aspect of the common bile duct near the suture material suspicious for possible stone (series 2, image 26). An additional 6 mm stone seen layering dependently within the distal duct (series 5, image 44). Mild intrahepatic biliary dilatation noted as well. Distal pancreatic duct also mildly dilated up to 7 mm. No definite mass lesion identified. Pancreas: Other than the aforementioned ductal dilatation, pancreas is otherwise unremarkable without acute inflammatory changes. Spleen: Spleen demonstrates no acute abnormality. Peripheral calcifications about the spleen noted. Adrenals/Urinary Tract: Adrenal glands are normal. Kidneys equal in size with symmetric enhancement. No nephrolithiasis, hydronephrosis, or focal enhancing renal mass. Ureters within normal limits for caliber without acute abnormality. Bladder within normal limits. Stomach/Bowel: Stomach within normal limits. No evidence for bowel obstruction. Appendix within normal limits. No acute inflammatory changes seen about the bowels. Anastomotic suture noted about the colon. Vascular/Lymphatic: Moderate to advanced aorto bi-iliac atherosclerotic disease. No aneurysm. No adenopathy. Reproductive:  Prostate within normal limits. Other: No free air or fluid. Fat containing left ventral hernia again noted, stable. Musculoskeletal: No acute osseous abnormality. No  worrisome lytic or blastic osseous lesions. Moderate to advanced degenerative spondylolysis noted within the lumbar spine. IMPRESSION: 1. Status post cholecystectomy. Enlargement of the common bile duct to 14 mm with a least 2 small stones within the duct itself as detailed above. Associated mild pancreatic ductal dilatation as well. 2. Small bilateral pleural effusions, left greater than right, with similar right basilar pleural plaque. Electronically Signed   By: Jeannine Boga M.D.   On: 06/06/2016 20:13   Dg Chest Port 1 View  Result Date: 06/06/2016 CLINICAL DATA:  Right upper quadrant pain.  Shortness of breath. EXAM: PORTABLE CHEST 1 VIEW COMPARISON:  Chest radiographs 12/03/2015. Lung bases from CT abdomen/ pelvis earlier this day. FINDINGS: Small left pleural effusion with adjacent atelectasis. A small right pleural effusion on CT is not seen radiographically. Low pleural plaque about the right diaphragm is not well visualized. Patient is post median sternotomy. Heart size upper normal. No pulmonary edema.  There is biapical pleural parenchymal scarring. No pneumothorax. IMPRESSION: Small left pleural effusion with adjacent atelectasis. Right pleural effusion on CT is not visualized radiographically. The effusions have decreased from prior chest radiograph of April 2017. Electronically Signed   By: Jeb Levering M.D.   On: 06/06/2016 21:51     Assessment/Plan Principal Problem:   Abdominal pain Active Problems:   CAD, multiple vessel; 90% LM, 90% ostLAD, 90% OstD2, 80% ostCx, 95% mRCA   Hyperlipidemia   S/P CABG x 3    1. Abdominal pain with CBD stones - patient's LFTs are normal we will repeat. Will keep patient on Zosyn. Consult gastroenterologist in a.m. for possible ERCP. Patient is nothing by mouth. 2. Hypertension - since patient is nothing by mouth I have placed patient on scheduled dose of metoprolol along with when necessary IV hydralazine. 3. CAD status post CABG -  denies any chest pain. 4. Hyperlipidemia on statins which can be continued after patient starts diet. 5. Anemia - follow CBC.   DVT prophylaxis: SCDs. Code Status: Full code.  Family Communication: Discussed with patient.  Disposition Plan: Home.  Consults called: None.  Admission status: Inpatient. Likely stay 2-3 days.    Rise Patience MD [facility] Pager [phone].  If 7PM-7AM, please contact night-coverage [email or web address] Password [email or web address]  06/07/2016, 4:31 AM

## 2016-06-07 NOTE — Consult Note (Signed)
Consultation  Referring Provider:      Primary Care Physician:  Pcp Not In System Primary Gastroenterologist:         Reason for Consultation:          Impression / Plan:   Choledocholithiasis + Troponin Nausea and vomiting RUQ/MQ pain  Certainly seems like he has symptomatic CBD stones but could also be atypical cardiac sxs - maybe has myocardial damage related to illness from CBD stones? Interesting that LFT's ok  Need cardiology evaluation to sort this out and provide pre-procedure and pre-anesthesia clearance and guidance as he will eventually need an ERCP and stone removal under general anesthesia. The risks and benefits as well as alternatives of endoscopic procedure(s) have been discussed and reviewed. All questions answered. The patient agrees to proceed.  I have contacted Dr. Tyrell Antonio who will consult cardiology.  Will dc Abx - not infected - not cholangitis  IV Dilaudud for pain 1 mg q 3-4 hrs          HPI:   Preston Bell is a 75 y.o. male  With PMH CAD, CABG 09/2015.  SVT.  ETOH abuse.  Anemia.  CKD Gallstone pancreatitis 09/2015 (Lipase 366).   Cholecystitis with Klebsiella sepsis 09/2015, treated with cholecystostomy tube as he developed developed Non STEMI . Lap chole 03/27/16, Dr Adonis Huguenin at [facility], [place]: .  Colon cancer 2012 colectomy, colostomy. s/p repair of large abdominal incisional hernia. Colostomy reversal 2013.   " no evidence of recurrent colon cancer on several postoperative checks".  Lung cancer.  He is admitted in transfer with choledocholithiasis on CT scan after presenting to [facility] ED with acute nausea and vomiting of bilious fluid 2 hours after breakfast. He then developed R mid quadrant pain radiating to back. He was transferred to [facility] for care as no GI coverage at [facility]. Since here has nausea and dry heaves, still with R abd pain. He has a + troponin at 1.17. Had mild sweats, no dyspnea, no chest pain. Had MI when presented with cholecystitis in  09/2015.     Past Medical History:  Diagnosis Date  . Acute diastolic heart failure (HCC) - EDP severely elevated @ Cath post NSTEMI 10/11/2015  . Alcohol abuse   . Anemia   . Anxiety   . 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 Cancer ([place])    Lung and Colon  . CHF (congestive heart failure) ([place])   . Chronic kidney disease    Acute Renal Failure, resolved  . Gallstone pancreatitis   . GERD (gastroesophageal reflux disease)   . History of colectomy   . HLD (hyperlipidemia)   . Hypercalcemia   . Hypertension   . NSTEMI (non-ST elevated myocardial infarction) ([place])   . Panic disorder   . Pleural effusion    Right Lung  . Sepsis ([place])   . SVT (supraventricular tachycardia) (HCC)     Past Surgical History:  Procedure Laterality Date  . CARDIAC CATHETERIZATION N/A 10/09/2015   Procedure: Left Heart Cath and Coronary Angiography;  Surgeon: Wellington Hampshire, MD;  Location: [place] CV LAB;  Service: Cardiovascular;  Laterality: N/A;  . CHOLECYSTECTOMY N/A 03/27/2016   Procedure: LAPAROSCOPIC CHOLECYSTECTOMY WITH INTRAOPERATIVE CHOLANGIOGRAM;  Surgeon: Clayburn Pert, MD;  Location: [facility] ORS;  Service: General;  Laterality: N/A;  . COLON SURGERY  2012   Colectomy ([place])  . COLOSTOMY REVERSAL  2013  . CORONARY ARTERY BYPASS GRAFT  N/A 10/13/2015   Procedure: CORONARY ARTERY BYPASS GRAFTING (CABG), ON PUMP, TIMES THREE, USING LEFT INTERNAL MAMMARY ARTERY, RIGHT GREATER SAPHENOUS VEIN HARVESTED ENDOSCOPICALLY;  Surgeon: Ivin Poot, MD;  Location: [place];  Service: Open Heart Surgery;  Laterality: N/A;  LIMA-LAD; SVG-OM; SVG-RCA  . CORONARY ARTERY BYPASS GRAFT     x 3 Vessels  . KNEE ARTHROSCOPY     Right-2011 left-2012  . TEE WITHOUT CARDIOVERSION N/A 10/13/2015   Procedure: TRANSESOPHAGEAL ECHOCARDIOGRAM (TEE);  Surgeon: Ivin Poot, MD;  Location: [place];  Service: Open Heart Surgery;  Laterality: N/A;  . THORACENTESIS       Family History  Problem Relation Age of Onset  . Cervical cancer Mother   . Lung cancer Father   . Lung cancer Brother   . Hypertension Brother   . Hypertension Son     Social History  Substance Use Topics  . Smoking status: Former Smoker    Packs/day: 1.00    Years: 30.00    Quit date: 10/06/1983  . Smokeless tobacco: Never Used  . Alcohol use No     Comment: Quit consuming alcohol in 1988. Former Heavy User    Prior to Admission medications   Medication Sig Start Date End Date Taking? Authorizing Provider  aspirin EC 325 MG EC tablet Take 1 tablet (325 mg total) by mouth daily. 10/19/15   John Giovanni, PA-C  atorvastatin (LIPITOR) 20 MG tablet Take 1 tablet (20 mg total) by mouth every morning. 10/19/15 10/18/16  John Giovanni, PA-C  esomeprazole (NEXIUM) 40 MG capsule Take 40 mg by mouth daily.    Historical Provider, MD  HYDROcodone-acetaminophen (NORCO/VICODIN) 5-325 MG tablet Take 1-2 tablets by mouth every 4 (four) hours as needed for moderate pain. 03/28/16   Clayburn Pert, MD  lisinopril (PRINIVIL,ZESTRIL) 20 MG tablet Take 1 tablet (20 mg total) by mouth daily. 02/29/16   Wellington Hampshire, MD  metoprolol (LOPRESSOR) 50 MG tablet Take 1 tablet (50 mg total) by mouth 2 (two) times daily. 10/19/15   Wayne E Gold, PA-C  mirtazapine (REMERON) 15 MG tablet Take 15 mg by mouth at bedtime. 06/14/15 06/13/16  Historical Provider, MD    Current Facility-Administered Medications  Medication Dose Route Frequency Provider Last Rate Last Dose  . acetaminophen (TYLENOL) tablet 650 mg  650 mg Oral Q6H PRN Rise Patience, MD       Or  . acetaminophen (TYLENOL) suppository 650 mg  650 mg Rectal Q6H PRN Rise Patience, MD      . dextrose 5 %-0.9 % sodium chloride infusion   Intravenous Continuous Rise Patience, MD 100 mL/hr at 06/07/16 0706    . hydrALAZINE (APRESOLINE) injection 10 mg  10 mg Intravenous Q4H PRN Rise Patience, MD      . metoprolol (LOPRESSOR)  injection 5 mg  5 mg Intravenous Q6H Rise Patience, MD   5 mg at 06/07/16 0703  . ondansetron (ZOFRAN) tablet 4 mg  4 mg Oral Q6H PRN Rise Patience, MD       Or  . ondansetron [facility]) injection 4 mg  4 mg Intravenous Q6H PRN Rise Patience, MD      . piperacillin-tazobactam (ZOSYN) IVPB 3.375 g  3.375 g Intravenous Q8H Erenest Blank, RPH   3.375 g at 06/07/16 0704    Allergies as of 06/06/2016  . (No Known Allergies)     Review of Systems:    This is positive for those  things mentioned in the HPI All other review of systems are negative.       Physical Exam:  Vital signs in last 24 hours: Temp:  [97.7 F (36.5 C)-98.5 F (36.9 C)] 98.5 F (36.9 C) (10/13 0542) Pulse Rate:  [97-111] 111 (10/13 0542) Resp:  [15-20] 17 (10/13 0542) BP: (133-203)/(80-109) 139/84 (10/13 0542) SpO2:  [90 %-98 %] 93 % (10/13 0542) Weight:  [163 lb (73.9 kg)] 163 lb (73.9 kg) (10/12 1804) Last BM Date: 06/06/16  General:  Well-developed, well-nourished and in no acute distress Eyes:  anicteric. Neck:   supple w/o thyromegaly or mass.  Lungs: Persistent crackles R base Heart:   S1S2, no rubs, murmurs, gallops. Abdomen:  soft, non-tender, no hepatosplenomegaly,  or mass and BS+. + LLQ ostomy closure scar and hernia Extremities:   no edema, cyanosis Skin   no rash. + tattoos Neuro:  A&O x 3.  Psych:  appropriate mood and  Affect.   Data Reviewed:   LAB RESULTS:  Recent Labs  06/06/16 1817 06/07/16 0608  WBC 12.4* 9.4  HGB 12.5* 11.1*  HCT 35.6* 32.4*  PLT 211 191   BMET  Recent Labs  06/06/16 1817 06/07/16 0608  NA 139 138  K 3.7 3.9  CL 106 104  CO2 23 25  GLUCOSE 109* 121*  BUN 16 12  CREATININE 1.12 1.04  CALCIUM 9.5 8.7*   LFT  Recent Labs  06/07/16 0608  PROT 7.2  ALBUMIN 3.7  AST 26  ALT 14*  ALKPHOS 80  BILITOT 0.6  BILIDIR 0.1  IBILI 0.5   PT/INR No results for input(s): LABPROT, INR in the last 72 hours. Lab Results  Component  Value Date   CKTOTAL 44 (L) 12/03/2015   TROPONINI 1.17 (HH) 06/07/2016   Lab Results  Component Value Date   LIPASE 32 06/07/2016    STUDIES: Ct Abdomen Pelvis W Contrast  Result Date: 06/06/2016 CLINICAL DATA:  Initial evaluation for acute right upper quadrant pain. EXAM: CT ABDOMEN AND PELVIS WITH CONTRAST TECHNIQUE: Multidetector CT imaging of the abdomen and pelvis was performed using the standard protocol following bolus administration of intravenous contrast. CONTRAST:  113mL ISOVUE-300 IOPAMIDOL (ISOVUE-300) INJECTION 61% COMPARISON:  Prior CT from 10/05/2015. FINDINGS: Lower chest: Bilateral pleural effusions, left greater than right. Associated bibasilar atelectasis. Irregular pleural plaque again noted at the right hemidiaphragm with associated soft tissue thickening measuring approximately 4.1 x 2.3 cm, similar to previous. No pericardial effusion. Mild cardiomegaly noted. Hepatobiliary: Liver demonstrates a normal contrast enhanced appearance. Patient is status post cholecystectomy. Suture material present at the gallbladder fossa. There is dilatation of the common bile duct somewhat greater than expected for normal post cholecystectomy changes, measuring up to 14 mm in diameter. 11 mm irregular hyperdensity at the proximal aspect of the common bile duct near the suture material suspicious for possible stone (series 2, image 26). An additional 6 mm stone seen layering dependently within the distal duct (series 5, image 44). Mild intrahepatic biliary dilatation noted as well. Distal pancreatic duct also mildly dilated up to 7 mm. No definite mass lesion identified. Pancreas: Other than the aforementioned ductal dilatation, pancreas is otherwise unremarkable without acute inflammatory changes. Spleen: Spleen demonstrates no acute abnormality. Peripheral calcifications about the spleen noted. Adrenals/Urinary Tract: Adrenal glands are normal. Kidneys equal in size with symmetric enhancement.  No nephrolithiasis, hydronephrosis, or focal enhancing renal mass. Ureters within normal limits for caliber without acute abnormality. Bladder within normal limits. Stomach/Bowel: Stomach within normal limits.  No evidence for bowel obstruction. Appendix within normal limits. No acute inflammatory changes seen about the bowels. Anastomotic suture noted about the colon. Vascular/Lymphatic: Moderate to advanced aorto bi-iliac atherosclerotic disease. No aneurysm. No adenopathy. Reproductive:  Prostate within normal limits. Other: No free air or fluid. Fat containing left ventral hernia again noted, stable. Musculoskeletal: No acute osseous abnormality. No worrisome lytic or blastic osseous lesions. Moderate to advanced degenerative spondylolysis noted within the lumbar spine. IMPRESSION: 1. Status post cholecystectomy. Enlargement of the common bile duct to 14 mm with a least 2 small stones within the duct itself as detailed above. Associated mild pancreatic ductal dilatation as well. 2. Small bilateral pleural effusions, left greater than right, with similar right basilar pleural plaque. Electronically Signed   By: Jeannine Boga M.D.   On: 06/06/2016 20:13   Dg Chest Port 1 View  Result Date: 06/06/2016 CLINICAL DATA:  Right upper quadrant pain.  Shortness of breath. EXAM: PORTABLE CHEST 1 VIEW COMPARISON:  Chest radiographs 12/03/2015. Lung bases from CT abdomen/ pelvis earlier this day. FINDINGS: Small left pleural effusion with adjacent atelectasis. A small right pleural effusion on CT is not seen radiographically. Low pleural plaque about the right diaphragm is not well visualized. Patient is post median sternotomy. Heart size upper normal. No pulmonary edema. There is biapical pleural parenchymal scarring. No pneumothorax. IMPRESSION: Small left pleural effusion with adjacent atelectasis. Right pleural effusion on CT is not visualized radiographically. The effusions have decreased from prior chest  radiograph of April 2017. Electronically Signed   By: Jeb Levering M.D.   On: 06/06/2016 21:51   EKG - sinus tach no acute ischemic changes  PREVIOUS ENDOSCOPIES:            Multiple colonoscopies to f/u colon cancer when incarcerated in [facility]    I have reviewed 2017 op notes lap chole and IOC, images fronm CT yesterday  Thanks   LOS: 0 days   @Kindall Swaby  Simonne Maffucci, MD, [facility] @  06/07/2016, 9:58 AM

## 2016-06-07 NOTE — Progress Notes (Signed)
Pharmacy Antibiotic Note  Preston Bell is a 75 y.o. male admitted on 06/07/2016 with intra-abdominal infection.  Pharmacy has been consulted for Zosyn dosing. WBC mildly elevated. Renal function good. CT with enlargement of common bile duct/two small stones.   Plan: Zosyn 3.375G IV q8h to be infused over 4 hours Trend WBC, temp, renal function  F/U infectious work-up  Temp (24hrs), Avg:97.8 F (36.6 C), Min:97.7 F (36.5 C), Max:97.8 F (36.6 C)   Recent Labs Lab 06/06/16 1817  WBC 12.4*  CREATININE 1.12    Estimated Creatinine Clearance: 60.5 mL/min (by C-G formula based on SCr of 1.12 mg/dL).    No Known Allergies   Preston Bell 06/07/2016 4:37 AM

## 2016-06-08 ENCOUNTER — Inpatient Hospital Stay (HOSPITAL_COMMUNITY): Payer: Medicare Other

## 2016-06-08 ENCOUNTER — Inpatient Hospital Stay (HOSPITAL_COMMUNITY): Payer: Medicare Other | Admitting: Anesthesiology

## 2016-06-08 ENCOUNTER — Encounter (HOSPITAL_COMMUNITY)
Admission: AD | Disposition: A | Discharge status: Home / Self Care | Payer: Self-pay | Source: Other Acute Inpatient Hospital | Attending: Internal Medicine

## 2016-06-08 ENCOUNTER — Encounter (HOSPITAL_COMMUNITY): Payer: Self-pay | Admitting: *Deleted

## 2016-06-08 DIAGNOSIS — R109 Unspecified abdominal pain: Secondary | ICD-10-CM

## 2016-06-08 DIAGNOSIS — K2981 Duodenitis with bleeding: Secondary | ICD-10-CM

## 2016-06-08 DIAGNOSIS — K269 Duodenal ulcer, unspecified as acute or chronic, without hemorrhage or perforation: Secondary | ICD-10-CM

## 2016-06-08 HISTORY — PX: ERCP: SHX5425

## 2016-06-08 LAB — TROPONIN I
TROPONIN I: 0.78 ng/mL — AB (ref <0.03)
TROPONIN I: 0.61 ng/mL — AB (ref <0.03)

## 2016-06-08 SURGERY — ERCP, WITH INTERVENTION IF INDICATED
Anesthesia: General

## 2016-06-08 MED ORDER — LIDOCAINE HCL (CARDIAC) 20 MG/ML IV SOLN
INTRAVENOUS | Status: DC | PRN
  Administered 2016-06-08: 100 mg via INTRAVENOUS

## 2016-06-08 MED ORDER — PROPOFOL 10 MG/ML IV BOLUS
INTRAVENOUS | Status: DC | PRN
  Administered 2016-06-08: 100 mg via INTRAVENOUS

## 2016-06-08 MED ORDER — INDOMETHACIN 50 MG RE SUPP
RECTAL | Status: AC
  Filled 2016-06-08: qty 2

## 2016-06-08 MED ORDER — CIPROFLOXACIN IN D5W 400 MG/200ML IV SOLN
INTRAVENOUS | Status: DC | PRN
  Administered 2016-06-08: 400 mg via INTRAVENOUS

## 2016-06-08 MED ORDER — CIPROFLOXACIN IN D5W 400 MG/200ML IV SOLN
INTRAVENOUS | Status: AC
  Filled 2016-06-08: qty 200

## 2016-06-08 MED ORDER — PANTOPRAZOLE SODIUM 40 MG PO TBEC
40.0000 mg | DELAYED_RELEASE_TABLET | Freq: Two times a day (BID) | ORAL | Status: DC
  Administered 2016-06-08 – 2016-06-09 (×2): 40 mg via ORAL
  Filled 2016-06-08 (×2): qty 1

## 2016-06-08 MED ORDER — INDOMETHACIN 50 MG RE SUPP
RECTAL | Status: DC | PRN
  Administered 2016-06-08: 100 mg via RECTAL

## 2016-06-08 MED ORDER — IOPAMIDOL (ISOVUE-300) INJECTION 61%
INTRAVENOUS | Status: AC
  Filled 2016-06-08: qty 50

## 2016-06-08 MED ORDER — FENTANYL CITRATE (PF) 100 MCG/2ML IJ SOLN
INTRAMUSCULAR | Status: AC
  Filled 2016-06-08: qty 2

## 2016-06-08 MED ORDER — ONDANSETRON HCL 4 MG/2ML IJ SOLN
INTRAMUSCULAR | Status: DC | PRN
  Administered 2016-06-08: 4 mg via INTRAVENOUS

## 2016-06-08 MED ORDER — PHENYLEPHRINE HCL 10 MG/ML IJ SOLN
INTRAMUSCULAR | Status: DC | PRN
  Administered 2016-06-08: 80 ug via INTRAVENOUS
  Administered 2016-06-08 (×3): 120 ug via INTRAVENOUS

## 2016-06-08 MED ORDER — MIDAZOLAM HCL 5 MG/5ML IJ SOLN
INTRAMUSCULAR | Status: DC | PRN
  Administered 2016-06-08: 2 mg via INTRAVENOUS

## 2016-06-08 MED ORDER — METOPROLOL TARTRATE 5 MG/5ML IV SOLN
INTRAVENOUS | Status: DC | PRN
  Administered 2016-06-08 (×2): 2.5 mg via INTRAVENOUS

## 2016-06-08 MED ORDER — INDOMETHACIN 50 MG RE SUPP: 100.0000 mg | Freq: Once | RECTAL | Status: DC

## 2016-06-08 MED ORDER — SODIUM CHLORIDE 0.9 % IV SOLN: INTRAVENOUS | Status: DC

## 2016-06-08 MED ORDER — SUCCINYLCHOLINE CHLORIDE 20 MG/ML IJ SOLN
INTRAMUSCULAR | Status: DC | PRN
  Administered 2016-06-08: 100 mg via INTRAVENOUS

## 2016-06-08 MED ORDER — MIDAZOLAM HCL 2 MG/2ML IJ SOLN
INTRAMUSCULAR | Status: AC
  Filled 2016-06-08: qty 2

## 2016-06-08 MED ORDER — LACTATED RINGERS IV SOLN
INTRAVENOUS | Status: DC
  Administered 2016-06-08: 1000 mL via INTRAVENOUS

## 2016-06-08 MED ORDER — LACTATED RINGERS IV SOLN
INTRAVENOUS | Status: DC | PRN
  Administered 2016-06-08: 08:00:00 via INTRAVENOUS

## 2016-06-08 MED ORDER — FENTANYL CITRATE (PF) 100 MCG/2ML IJ SOLN
INTRAMUSCULAR | Status: DC | PRN
  Administered 2016-06-08 (×2): 50 ug via INTRAVENOUS

## 2016-06-08 MED ORDER — GLUCAGON HCL RDNA (DIAGNOSTIC) 1 MG IJ SOLR
INTRAMUSCULAR | Status: AC
  Filled 2016-06-08: qty 1

## 2016-06-08 MED ORDER — SODIUM CHLORIDE 0.9 % IV SOLN
INTRAVENOUS | Status: DC | PRN
  Administered 2016-06-08: 20 mL

## 2016-06-08 MED ORDER — METOPROLOL TARTRATE 50 MG PO TABS
50.0000 mg | ORAL_TABLET | Freq: Two times a day (BID) | ORAL | Status: DC
Start: 2016-06-08 — End: 2016-06-09
  Administered 2016-06-08 – 2016-06-09 (×2): 50 mg via ORAL
  Filled 2016-06-08 (×2): qty 1

## 2016-06-08 NOTE — Transfer of Care (Signed)
Immediate Anesthesia Transfer of Care Note  Patient: Preston Bell  Procedure(s) Performed: Procedure(s): ENDOSCOPIC RETROGRADE CHOLANGIOPANCREATOGRAPHY (ERCP) (N/A)  Patient Location: PACU  Anesthesia Type:General  Level of Consciousness: awake, alert  and oriented  Airway & Oxygen Therapy: Patient Spontanous Breathing and Patient connected to nasal cannula oxygen  Post-op Assessment: Report given to RN and Post -op Vital signs reviewed and stable  Post vital signs: Reviewed and stable  Last Vitals:  Vitals:   06/08/16 0749 06/08/16 0934  BP: (!) 143/65   Pulse: (!) 101   Resp: 19   Temp: 37 C 36.4 C    Last Pain:  Vitals:   06/08/16 0934  TempSrc:   PainSc: 0-No pain      Patients Stated Pain Goal: 2 ([id number])  Complications: No apparent anesthesia complications

## 2016-06-08 NOTE — Anesthesia Procedure Notes (Signed)
Procedure Name: Intubation Date/Time: 06/08/2016 8:24 AM Performed by: Clearnce Sorrel Pre-anesthesia Checklist: Patient identified, Emergency Drugs available, Suction available, Patient being monitored and Timeout performed Patient Re-evaluated:Patient Re-evaluated prior to inductionOxygen Delivery Method: Circle system utilized Preoxygenation: Pre-oxygenation with 100% oxygen Intubation Type: IV induction Ventilation: Mask ventilation without difficulty and Oral airway inserted - appropriate to patient size Laryngoscope Size: Mac and 3 Grade View: Grade I Tube type: Oral Tube size: 7.5 mm Number of attempts: 1 Airway Equipment and Method: Stylet Placement Confirmation: ETT inserted through vocal cords under direct vision,  positive ETCO2 and breath sounds checked- equal and bilateral Secured at: 23 cm Tube secured with: Tape Dental Injury: Teeth and Oropharynx as per pre-operative assessment

## 2016-06-08 NOTE — Anesthesia Postprocedure Evaluation (Signed)
Anesthesia Post Note  Patient: Carver E Docken  Procedure(s) Performed: Procedure(s) (LRB): ENDOSCOPIC RETROGRADE CHOLANGIOPANCREATOGRAPHY (ERCP) (N/A)  Patient location during evaluation: Endoscopy Anesthesia Type: General Level of consciousness: awake and alert Pain management: pain level controlled Vital Signs Assessment: post-procedure vital signs reviewed and stable Respiratory status: spontaneous breathing, nonlabored ventilation, respiratory function stable and patient connected to nasal cannula oxygen Cardiovascular status: blood pressure returned to baseline and stable Postop Assessment: no signs of nausea or vomiting Anesthetic complications: no    Last Vitals:  Vitals:   06/08/16 0949 06/08/16 0954  BP: (!) 119/58 127/60  Pulse: 88 92  Resp: 17   Temp:  36.5 C    Last Pain:  Vitals:   06/08/16 0949  TempSrc:   PainSc: 0-No pain                 Catalina Gravel

## 2016-06-08 NOTE — Progress Notes (Addendum)
PROGRESS NOTE    Preston Bell  [id number] DOB: 08/16/1941 DOA: 06/07/2016 PCP: Pcp Not In System    Brief Narrative: HPI: Preston Bell is a 75 y.o. male with CAD status post CABG recently, had cholecystectomy 2 months ago presented to the ER at [facility] with complaints of multiple episodes of nausea vomiting and epigastric discomfort since yesterday morning. Denies any chest pain or fever chills or shortness of breath. CT scan of the abdomen and pelvis shows CBD stones and patient was transferred to [facility] for further GI workup since there was no gastroenterologist available at [facility]. On my exam patient is not in distress and has not had any further episodes of vomiting since patient has not eaten anything.   ED Course: CT scan showing CBD stones.  Assessment & Plan:   Principal Problem:   Abdominal pain Active Problems:   CAD, multiple vessel; 90% LM, 90% ostLAD, 90% OstD2, 80% ostCx, 95% mRCA   Hyperlipidemia   S/P CABG x 3   Multiple duodenal ulcers   1-Choledocholitihiasis; Right upper quadrant pain, nausea, vomiting;  CT with CBD stone.  GI consulted for possible ERCP.  Troponin elevated, cardiology consulted for clearance. Clear by cardio.  Patient underwent ERCP 10-14, found to have multiple non bleeding duodenal ulcer, choledocholithiasis was found, Complete removal  was accomplished by biliary sphincterotomy and  balloon extraction.  clear diet today. Advanced to Heart healthy tomorrow.             2-CAD, s/p STEMI, S/P CABG Feb 2017;  He is chest pain free, denies dyspnea.  Troponin elevated.  Repeat EKG.  Cardiology Consulted. No further cardiac evaluation. Patient was clear for procedure.  Change IV metoprolol to oral.  Resume lisinopril when BP increases.   Multiple Duodenal Ulcer; started on protonix.   DVT prophylaxis: SCD.  Code Status: Full code.  Family Communication: daughter at bedside.  Disposition Plan:  Remain inpatient.  Consultants:    Cardio GI    Procedures: none   Antimicrobials: none   Subjective: Feeling better, no nausea or vomiting.   Objective: Vitals:   06/08/16 0934 06/08/16 0949 06/08/16 0954 06/08/16 1300  BP: (!) 121/59 (!) 119/58 127/60 (!) 103/49  Pulse: 89 88 92 92  Resp: 19 17    Temp: 97.6 F (36.4 C)  97.7 F (36.5 C) 97.7 F (36.5 C)  TempSrc:    Oral  SpO2: 94% 95% 93% 97%    Intake/Output Summary (Last 24 hours) at 06/08/16 1550 Last data filed at 06/08/16 1300  Gross per 24 hour  Intake              500 ml  Output              875 ml  Net             -375 ml   There were no vitals filed for this visit.  Examination:  General exam: Appears calm and comfortable  Respiratory system: Clear to auscultation. Respiratory effort normal. Cardiovascular system: S1 & S2 heard, RRR. No JVD, murmurs, rubs, gallops or clicks. No pedal edema. Gastrointestinal system: Abdomen is nondistended, soft and nontender. No organomegaly or masses felt. Normal bowel sounds heard. Central nervous system: Alert and oriented. No focal neurological deficits. Extremities: Symmetric 5 x 5 power. Skin: No rashes, lesions or ulcers Psychiatry: Judgement and insight appear normal. Mood & affect appropriate.     Data Reviewed: I have personally reviewed following labs and imaging  studies  CBC:  Recent Labs Lab 06/06/16 1817 06/07/16 0608  WBC 12.4* 9.4  NEUTROABS 8.9* 7.4  HGB 12.5* 11.1*  HCT 35.6* 32.4*  MCV 95.2 93.9  PLT 211 [id number]   Basic Metabolic Panel:  Recent Labs Lab 06/06/16 1817 06/07/16 0608  NA 139 138  K 3.7 3.9  CL 106 104  CO2 23 25  GLUCOSE 109* 121*  BUN 16 12  CREATININE 1.12 1.04  CALCIUM 9.5 8.7*   GFR: Estimated Creatinine Clearance: 65.1 mL/min (by C-G formula based on SCr of 1.04 mg/dL). Liver Function Tests:  Recent Labs Lab 06/06/16 1817 06/07/16 0608  AST 26 26  ALT 15* 14*  ALKPHOS 93 80  BILITOT 0.7 0.6  PROT 9.1* 7.2  ALBUMIN 4.7 3.7     Recent Labs Lab 06/06/16 1817 06/07/16 0608  LIPASE 92* 32   No results for input(s): AMMONIA in the last 168 hours. Coagulation Profile: No results for input(s): INR, PROTIME in the last 168 hours. Cardiac Enzymes:  Recent Labs Lab 06/07/16 0608 06/07/16 1049 06/07/16 1903 06/08/16 0004 06/08/16 0537  TROPONINI 1.17* 1.17* 0.89* 0.78* 0.61*   BNP (last 3 results) No results for input(s): PROBNP in the last 8760 hours. HbA1C: No results for input(s): HGBA1C in the last 72 hours. CBG: No results for input(s): GLUCAP in the last 168 hours. Lipid Profile: No results for input(s): CHOL, HDL, LDLCALC, TRIG, CHOLHDL, LDLDIRECT in the last 72 hours. Thyroid Function Tests: No results for input(s): TSH, T4TOTAL, FREET4, T3FREE, THYROIDAB in the last 72 hours. Anemia Panel: No results for input(s): VITAMINB12, FOLATE, FERRITIN, TIBC, IRON, RETICCTPCT in the last 72 hours. Sepsis Labs: No results for input(s): PROCALCITON, LATICACIDVEN in the last 168 hours.  No results found for this or any previous visit (from the past 240 hour(s)).       Radiology Studies: Ct Abdomen Pelvis W Contrast  Result Date: 06/06/2016 CLINICAL DATA:  Initial evaluation for acute right upper quadrant pain. EXAM: CT ABDOMEN AND PELVIS WITH CONTRAST TECHNIQUE: Multidetector CT imaging of the abdomen and pelvis was performed using the standard protocol following bolus administration of intravenous contrast. CONTRAST:  159mL ISOVUE-300 IOPAMIDOL (ISOVUE-300) INJECTION 61% COMPARISON:  Prior CT from 10/05/2015. FINDINGS: Lower chest: Bilateral pleural effusions, left greater than right. Associated bibasilar atelectasis. Irregular pleural plaque again noted at the right hemidiaphragm with associated soft tissue thickening measuring approximately 4.1 x 2.3 cm, similar to previous. No pericardial effusion. Mild cardiomegaly noted. Hepatobiliary: Liver demonstrates a normal contrast enhanced appearance.  Patient is status post cholecystectomy. Suture material present at the gallbladder fossa. There is dilatation of the common bile duct somewhat greater than expected for normal post cholecystectomy changes, measuring up to 14 mm in diameter. 11 mm irregular hyperdensity at the proximal aspect of the common bile duct near the suture material suspicious for possible stone (series 2, image 26). An additional 6 mm stone seen layering dependently within the distal duct (series 5, image 44). Mild intrahepatic biliary dilatation noted as well. Distal pancreatic duct also mildly dilated up to 7 mm. No definite mass lesion identified. Pancreas: Other than the aforementioned ductal dilatation, pancreas is otherwise unremarkable without acute inflammatory changes. Spleen: Spleen demonstrates no acute abnormality. Peripheral calcifications about the spleen noted. Adrenals/Urinary Tract: Adrenal glands are normal. Kidneys equal in size with symmetric enhancement. No nephrolithiasis, hydronephrosis, or focal enhancing renal mass. Ureters within normal limits for caliber without acute abnormality. Bladder within normal limits. Stomach/Bowel: Stomach within normal limits. No evidence  for bowel obstruction. Appendix within normal limits. No acute inflammatory changes seen about the bowels. Anastomotic suture noted about the colon. Vascular/Lymphatic: Moderate to advanced aorto bi-iliac atherosclerotic disease. No aneurysm. No adenopathy. Reproductive:  Prostate within normal limits. Other: No free air or fluid. Fat containing left ventral hernia again noted, stable. Musculoskeletal: No acute osseous abnormality. No worrisome lytic or blastic osseous lesions. Moderate to advanced degenerative spondylolysis noted within the lumbar spine. IMPRESSION: 1. Status post cholecystectomy. Enlargement of the common bile duct to 14 mm with a least 2 small stones within the duct itself as detailed above. Associated mild pancreatic ductal  dilatation as well. 2. Small bilateral pleural effusions, left greater than right, with similar right basilar pleural plaque. Electronically Signed   By: Jeannine Boga M.D.   On: 06/06/2016 20:13   Dg Chest Port 1 View  Result Date: 06/06/2016 CLINICAL DATA:  Right upper quadrant pain.  Shortness of breath. EXAM: PORTABLE CHEST 1 VIEW COMPARISON:  Chest radiographs 12/03/2015. Lung bases from CT abdomen/ pelvis earlier this day. FINDINGS: Small left pleural effusion with adjacent atelectasis. A small right pleural effusion on CT is not seen radiographically. Low pleural plaque about the right diaphragm is not well visualized. Patient is post median sternotomy. Heart size upper normal. No pulmonary edema. There is biapical pleural parenchymal scarring. No pneumothorax. IMPRESSION: Small left pleural effusion with adjacent atelectasis. Right pleural effusion on CT is not visualized radiographically. The effusions have decreased from prior chest radiograph of April 2017. Electronically Signed   By: Jeb Levering M.D.   On: 06/06/2016 21:51   Dg Ercp Biliary & Pancreatic Ducts  Result Date: 06/08/2016 CLINICAL DATA:  Bile duct stones EXAM: ERCP TECHNIQUE: Multiple spot images obtained with the fluoroscopic device and submitted for interpretation post-procedure. FLUOROSCOPY TIME:  3 minutes, 22 seconds COMPARISON:  CT abdomen pelvis - 06/06/2016 ; intraoperative cholangiogram during laparoscopic cholecystectomy -03/27/2016 FINDINGS: A single spot intraoperative fluoroscopic image of the right upper abdominal quadrant is provided for review Provided image demonstrates an endoscope overlying the right upper abdominal quadrant. There is faint opacification of the common bile duct. IMPRESSION: Single image of the right upper abdominal quadrant during ERCP. Correlation with the operative report is recommended. These images were submitted for radiologic interpretation only. Please see the procedural report  for the amount of contrast and the fluoroscopy time utilized. Electronically Signed   By: Sandi Mariscal M.D.   On: 06/08/2016 10:51        Scheduled Meds: . indomethacin  100 mg Rectal Once  . metoprolol  5 mg Intravenous Q6H   Continuous Infusions:     LOS: 1 day    Time spent: 35 minutes.     Elmarie Shiley, MD [facility] Pager [phone]  If 7PM-7AM, please contact night-coverage [email or web address] Password [email or web address] 06/08/2016, 3:50 PM

## 2016-06-08 NOTE — Op Note (Signed)
[facility] Patient Name: Preston Bell Procedure Date : 06/08/2016 MRN: [id number] Attending MD: Gatha Mayer , MD Date of Birth: 08-14-1941 CSN: [id number] Age: 75 Admit Type: Inpatient Procedure:                ERCP Indications:              Bile duct stone(s), For therapy of bile duct                            stone(s) Providers:                Gatha Mayer, MD, Cleda Daub, RN, Corliss Parish, Technician Referring MD:              Medicines:                General Anesthesia, Cipro [id number] mg IV Complications:            No immediate complications. Estimated Blood Loss:     Estimated blood loss: none. Procedure:                Pre-Anesthesia Assessment:                           - Prior to the procedure, a History and Physical                            was performed, and patient medications and                            allergies were reviewed. The patient's tolerance of                            previous anesthesia was also reviewed. The risks                            and benefits of the procedure and the sedation                            options and risks were discussed with the patient.                            All questions were answered, and informed consent                            was obtained. Prior Anticoagulants: The patient                            last took aspirin 4 days prior to the procedure.                            ASA Grade Assessment: III - A patient with severe  systemic disease. After reviewing the risks and                            benefits, the patient was deemed in satisfactory                            condition to undergo the procedure.                           After obtaining informed consent, the scope was                            passed under direct vision. Throughout the                            procedure, the patient's blood pressure, pulse, and                       oxygen saturations were monitored continuously. The                            [id number]) scope was introduced through                            the mouth, and used to inject contrast into and                            used to inject contrast into the bile duct. The                            ERCP was accomplished without difficulty. The                            patient tolerated the procedure well. Scope In: Scope Out: Findings:      A scout film of the abdomen was obtained. Surgical clips, consistent       with a previous cholecystectomy, were seen in the area of the right       upper quadrant of the abdomen. The scope was passed under direct vision       through the upper GI tract. Many non-bleeding superficial duodenal       ulcers with pigmented material were found in the second portion of the       duodenum. The largest lesion was 10 mm in largest dimension. The upper       GI exam was otherwise without abnormality. Biopsies were taken in the       second portion of the duodenum through the ERCP scope with the cold       forceps for histology. The bile duct was deeply cannulated with the       short-nosed traction sphincterotome. Contrast was injected.       Opacification of the entire biliary tree except for the gallbladder was       successful. The maximum diameter of the ducts was 15 mm. The upper third       of the main bile duct contained one stone, which was 7 mm in diameter.  The entire biliary tree except for the gallbladder was moderately       dilated and diffusely dilated. The largest diameter was 15 mm. A       cholecystectomy had been performed. An 8 mm biliary sphincterotomy was       made with a short nose sphincterotome using ERBE electrocautery. There       was no post-sphincterotomy bleeding. The biliary tree was swept with a       15 mm balloon starting at the left intrahepatic duct(s). Two pigmented       stones were removed.  One actually out with suctioning in duodebnnum and       exited after sphincterotomy. No stones remained. No pancreatocram. Impression:               - Multiple non-bleeding duodenal ulcers with                            pigmented material.                           - Biopsies were taken with a cold forceps for                            histology in the second portion of the duodenum.                           - The biliary system was moderately dilated.                           - The patient has had a cholecystectomy.                           - Choledocholithiasis was found. Complete removal                            was accomplished by biliary sphincterotomy and                            balloon extraction.                           - A biliary sphincterotomy was performed.                           - The biliary tree was swept. Recommendation:           - Clear liquid diet today, then advance as                            tolerated to cardiac diet [Duration].                           - Use Protonix (pantoprazole) 40 mg PO daily                            indefinitely.                           -  LABS AND REASSESS IN AM                           HOPEFULLY HOME TOMORROW                           ANTICIPATE F/U PCP AFTER DC AND BACK TO GI PRN                           STAY ON A PPI - HE STOPPED NEXIUM DUE TO COST I                            THINK - BET ULCERATIONS ASA EFFECT W/O PPI - WOULD                            LIKELY BE OK ON 20 MG OTC PPI Procedure Code(s):        --- Professional ---                           [phone], Endoscopic retrograde                            cholangiopancreatography (ERCP); with removal of                            calculi/debris from biliary/pancreatic duct(s)                           43262, Endoscopic retrograde                            cholangiopancreatography (ERCP); with                             sphincterotomy/papillotomy                           43261, Endoscopic retrograde                            cholangiopancreatography (ERCP); with biopsy,                            single or multiple Diagnosis Code(s):        --- Professional ---                           K26.9, Duodenal ulcer, unspecified as acute or                            chronic, without hemorrhage or perforation                           Z90.49, Acquired absence of other specified parts                            of  digestive tract                           K80.50, Calculus of bile duct without cholangitis                            or cholecystitis without obstruction                           K83.8, Other specified diseases of biliary tract CPT copyright 2016 [employer]. All rights reserved. The codes documented in this report are preliminary and upon coder review may  be revised to meet current compliance requirements. Gatha Mayer, MD 06/08/2016 9:45:03 AM This report has been signed electronically. Number of Addenda: 0

## 2016-06-08 NOTE — Anesthesia Preprocedure Evaluation (Addendum)
Anesthesia Evaluation  Patient identified by MRN, date of birth, ID band Patient awake    Reviewed: Allergy & Precautions, NPO status , Patient's Chart, lab work & pertinent test results, reviewed documented beta blocker date and time   Airway Mallampati: I  TM Distance: >3 FB Neck ROM: Full    Dental  (+) Dental Advisory Given, Edentulous Upper, Edentulous Lower   Pulmonary former smoker,  Lung cancer   Pulmonary exam normal breath sounds clear to auscultation       Cardiovascular hypertension, Pt. on medications and Pt. 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  Reproductive/Obstetrics                            Anesthesia Physical Anesthesia Plan  ASA: III  Anesthesia Plan: General   Post-op Pain Management:    Induction: Intravenous  Airway Management Planned: Oral ETT  Additional Equipment:   Intra-op Plan:   Post-operative Plan: Extubation in OR  Informed Consent: I have reviewed the patients History and Physical, chart, labs and discussed the procedure including the risks, benefits and alternatives for the proposed anesthesia with the patient or authorized representative who has indicated his/her understanding and acceptance.   Dental advisory given  Plan Discussed with: CRNA  Anesthesia Plan Comments: (Risks/benefits of general anesthesia  discussed with patient including risk of damage to teeth, lips, gum, and tongue, nausea/vomiting, allergic reactions to medications, and the possibility of heart attack, stroke and death.  All patient questions answered.  Patient wishes to proceed.)        Anesthesia Quick Evaluation

## 2016-06-09 LAB — CBC
HEMATOCRIT: 27.7 % — AB (ref 39.0–52.0)
HEMOGLOBIN: 9.3 g/dL — AB (ref 13.0–17.0)
MCH: 31.3 pg (ref 26.0–34.0)
MCHC: 33.6 g/dL (ref 30.0–36.0)
MCV: 93.3 fL (ref 78.0–100.0)
Platelets: 138 10*3/uL — ABNORMAL LOW (ref 150–400)
RBC: 2.97 MIL/uL — ABNORMAL LOW (ref 4.22–5.81)
RDW: 14.2 % (ref 11.5–15.5)
WBC: 4.8 10*3/uL (ref 4.0–10.5)

## 2016-06-09 LAB — BASIC METABOLIC PANEL
ANION GAP: 7 (ref 5–15)
BUN: 10 mg/dL (ref 6–20)
CALCIUM: 8.4 mg/dL — AB (ref 8.9–10.3)
CHLORIDE: 105 mmol/L (ref 101–111)
CO2: 25 mmol/L (ref 22–32)
Creatinine, Ser: 1 mg/dL (ref 0.61–1.24)
GFR calc non Af Amer: >60 mL/min (ref >60)
Glucose, Bld: 92 mg/dL (ref 65–99)
Potassium: 3.7 mmol/L (ref 3.5–5.1)
Sodium: 137 mmol/L (ref 135–145)

## 2016-06-09 MED ORDER — PANTOPRAZOLE SODIUM 40 MG PO TBEC: 40.0000 mg | DELAYED_RELEASE_TABLET | Freq: Two times a day (BID) | ORAL | 2 refills | Status: AC

## 2016-06-09 NOTE — Progress Notes (Signed)
Discharge instructions provided to pt and all questions answered. Pt is delight and ready to discharge.

## 2016-06-09 NOTE — Progress Notes (Signed)
Progress Note   Subjective  Chief Complaint: Choledocholithiasis   Pt s/p ERCP 06/08/16-eating clear liquid diet, no pain or further symptoms. He does tell me that he had a large amount of BM yesterday/through the night after getting "prune juice". He is excited at the aspect of going home today, but will have to arrange a ride from his daughter as he "has never driven in his life".    Objective   Vital signs in last 24 hours: Temp:  [97.6 F (36.4 C)-98 F (36.7 C)] 98 F (36.7 C) (10/15 0603) Pulse Rate:  [76-111] 76 (10/15 0603) Resp:  [16-19] 18 (10/15 0603) BP: (100-150)/(49-77) 100/76 (10/15 0603) SpO2:  [93 %-98 %] 98 % (10/15 0603) Last BM Date: 06/06/16 General: Caucasian male in NAD Heart:  Regular rate and rhythm; no murmurs Lungs: Respirations even and unlabored, lungs CTA bilaterally Abdomen:  Soft, nontender and nondistended. Normal bowel sounds.+ LLQ ostomy closure scar and hernia Extremities:  Without edema. Neurologic:  Alert and oriented,  grossly normal neurologically. Psych:  Cooperative. Normal mood and affect.   Lab Results:  Recent Labs  06/06/16 1817 06/07/16 0608 06/09/16 0258  WBC 12.4* 9.4 4.8  HGB 12.5* 11.1* 9.3*  HCT 35.6* 32.4* 27.7*  PLT 211 191 138*   BMET  Recent Labs  06/06/16 1817 06/07/16 0608 06/09/16 0258  NA 139 138 137  K 3.7 3.9 3.7  CL 106 104 105  CO2 23 25 25   GLUCOSE 109* 121* 92  BUN 16 12 10   CREATININE 1.12 1.04 1.00  CALCIUM 9.5 8.7* 8.4*   LFT  Recent Labs  06/07/16 0608  PROT 7.2  ALBUMIN 3.7  AST 26  ALT 14*  ALKPHOS 80  BILITOT 0.6  BILIDIR 0.1  IBILI 0.5    ERCP, Dr. Carlean Purl 06/08/16: Impression:               - Multiple non-bleeding duodenal ulcers with                            pigmented material.                           - Biopsies were taken with a cold forceps for                            histology in the second portion of the duodenum.                           - The  biliary system was moderately dilated.                           - The patient has had a cholecystectomy.                           - Choledocholithiasis was found. Complete removal                            was accomplished by biliary sphincterotomy and                            balloon extraction.                           -  A biliary sphincterotomy was performed.                           - The biliary tree was swept. Recommendation:           - Clear liquid diet today, then advance as                            tolerated to cardiac diet [Duration].                           - Use Protonix (pantoprazole) 40 mg PO daily                            indefinitely.                           -                           LABS AND REASSESS IN AM                           HOPEFULLY HOME TOMORROW                           ANTICIPATE F/U PCP AFTER DC AND BACK TO GI PRN                           STAY ON A PPI - HE STOPPED NEXIUM DUE TO COST I                            THINK - BET ULCERATIONS ASA EFFECT W/O PPI - WOULD                            LIKELY BE OK ON 20 MG OTC PPI     Assessment / Plan:    Assessment: 1. Choledocholithiasis: ERCP yesterday with successful removal of stones via sphincterotomy, pt with no pain this morning, tolerating PO intake 2. Nausea and vomiting: resolved 3. RUQ pain: resolved  Plan: 1. Continue to advance diet as tolerated 2. Expressed the importance of remaining on PPI, pt verbalized understanding, please see other recommendations after ERCP as above 3. Patient ok for d/c per our service 4. Discussed above with Dr. Carlean Purl  We will sign off.   LOS: 2 days   Levin Erp  06/09/2016, 9:19 AM  Pager # [phone]  Agree w/ Ms. Lemmon's management. Stay on a PPI!  Gatha Mayer, MD, [facility] [phone] (pager) [phone] after 5 PM, weekends and holidays  06/09/2016 12:32 PM

## 2016-06-09 NOTE — Discharge Summary (Signed)
Physician Discharge Summary  Preston Bell [id number] DOB: [birth year] DOA: 06/07/2016  PCP: Pcp Not In System  Admit date: 06/07/2016 Discharge date: 06/09/2016  Admitted From: Home  Disposition:  Home   Recommendations for Outpatient Follow-up:  1. Follow up with PCP in 1-2 weeks 2. Please obtain BMP/CBC in one week 3. Needs to follow bx results    Discharge Condition: stable,  CODE STATUS: full code.  Diet recommendation: Heart Healthy  Brief/Interim Summary: HPI: Preston Bell a 75 y.o.malewith CAD status post CABG recently, had cholecystectomy 2 months ago presented to the ER at [facility] with complaints of multiple episodes of nausea vomiting and epigastric discomfort since yesterday morning. Denies any chest pain or fever chills or shortness of breath. CT scan of the abdomen and pelvis shows CBD stones and patient was transferred to [facility] for further GI workup since there was no gastroenterologist available at [facility]. On my exam patient is not in distress and has not had any further episodes of vomiting since patient has not eaten anything.  ED Course:CT scan showing CBD stones.  Assessment & Plan: 1-Choledocholitihiasis; Right upper quadrant pain, nausea, vomiting;  CT with CBD stone.  GI consulted for possible ERCP.  Troponin elevated, cardiology consulted for clearance. Clear by cardio.  Patient underwent ERCP 10-14, found to have multiple non bleeding duodenal ulcer, choledocholithiasis was found, Complete removal was accomplished by biliary sphincterotomy and balloon extraction. Tolerating clear diet, advance to regular diet. 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  Troponin elevated. Trending down  Cardiology Consulted. No further cardiac evaluation. Patient was clear for procedure.  Metoprolol  Resume lisinopril at discharge   Multiple Duodenal Ulcer; PPI.  Prescription provided. 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   atorvastatin 20 MG tablet Commonly known as:  LIPITOR Take 1 tablet (20 mg total) by mouth every morning.   HYDROcodone-acetaminophen 5-325 MG tablet Commonly known as:  NORCO/VICODIN Take 1-2 tablets by mouth every 4 (four) hours as needed for moderate pain.   lisinopril 20 MG tablet Commonly known as:  PRINIVIL,ZESTRIL Take 1 tablet (20 mg total) by mouth daily.   metoprolol 50 MG tablet Commonly known as:  LOPRESSOR Take 1 tablet (50 mg total) by mouth 2 (two) times daily. What changed:  when to take this   pantoprazole 40 MG tablet Commonly known as:  PROTONIX Take 1 tablet (40 mg total) by mouth 2 (two) times daily.   REMERON 15 MG tablet Generic drug:  mirtazapine Take 15 mg by mouth at bedtime.       No Known Allergies  Consultations:  GI   Procedures/Studies: Ct Abdomen Pelvis W Contrast  Result Date: 06/06/2016 CLINICAL DATA:  Initial evaluation for acute right upper quadrant pain. EXAM: CT ABDOMEN AND PELVIS WITH CONTRAST TECHNIQUE: Multidetector CT imaging of the abdomen and pelvis was performed using the standard protocol following bolus administration of intravenous contrast. CONTRAST:  118mL ISOVUE-300 IOPAMIDOL (ISOVUE-300) INJECTION 61% COMPARISON:  Prior CT from 10/05/2015. FINDINGS: Lower chest: Bilateral  pleural effusions, left greater than right. Associated bibasilar atelectasis. Irregular pleural plaque again noted at the right hemidiaphragm with associated soft tissue thickening measuring approximately 4.1 x 2.3 cm, similar to previous. No pericardial effusion. Mild cardiomegaly noted. Hepatobiliary: Liver demonstrates a normal contrast enhanced appearance. Patient is status post cholecystectomy. Suture material present at the gallbladder fossa. There is dilatation of the common bile duct somewhat greater than expected for normal post cholecystectomy changes, measuring up to 14 mm in diameter. 11 mm irregular hyperdensity at the proximal aspect of the common bile duct near the suture material suspicious for possible stone (series 2, image 26). An additional 6 mm stone seen layering dependently within the distal duct (series 5, image 44). Mild intrahepatic biliary dilatation noted as well. Distal pancreatic duct also mildly dilated up to 7 mm. No definite mass lesion identified. Pancreas: Other than the aforementioned ductal dilatation, pancreas is otherwise unremarkable without acute inflammatory changes. Spleen: Spleen demonstrates no acute abnormality. Peripheral calcifications about the spleen noted. Adrenals/Urinary Tract: Adrenal glands are normal. Kidneys equal in size with symmetric enhancement. No nephrolithiasis, hydronephrosis, or focal enhancing renal mass. Ureters within normal limits for caliber without acute abnormality. Bladder within normal limits. Stomach/Bowel: Stomach within normal limits. No evidence for bowel obstruction. Appendix within normal limits. No acute inflammatory changes seen about the bowels. Anastomotic suture noted about the colon. Vascular/Lymphatic: Moderate to advanced aorto bi-iliac atherosclerotic disease. No aneurysm. No adenopathy. Reproductive:  Prostate within normal limits. Other: No free air or fluid. Fat containing left ventral hernia again noted, stable.  Musculoskeletal: No acute osseous abnormality. No worrisome lytic or blastic osseous lesions. Moderate to advanced degenerative spondylolysis noted within the lumbar spine. IMPRESSION: 1. Status post cholecystectomy. Enlargement of the common bile duct to 14 mm with a least 2 small stones within the duct itself as detailed above. Associated mild pancreatic ductal dilatation as well. 2. Small bilateral pleural effusions, left greater than right, with similar right basilar pleural plaque. Electronically Signed   By: Jeannine Boga M.D.   On: 06/06/2016 20:13   Dg Chest Port 1 View  Result Date: 06/06/2016 CLINICAL DATA:  Right upper quadrant pain.  Shortness of breath. EXAM: PORTABLE CHEST 1 VIEW COMPARISON:  Chest radiographs 12/03/2015. Lung bases from CT abdomen/ pelvis earlier this day. FINDINGS: Small left pleural effusion with adjacent atelectasis. A small right pleural effusion on CT is not seen radiographically. Low pleural plaque about the right diaphragm is not well visualized. Patient is post median sternotomy. Heart size upper normal. No pulmonary edema. There is biapical pleural parenchymal scarring. No pneumothorax. IMPRESSION: Small left pleural effusion with adjacent atelectasis. Right pleural effusion on CT is not visualized radiographically. The effusions have decreased from prior chest radiograph of April 2017. Electronically Signed   By: Jeb Levering M.D.   On: 06/06/2016 21:51   Dg Ercp Biliary & Pancreatic Ducts  Result Date: 06/08/2016 CLINICAL DATA:  Bile duct stones EXAM: ERCP TECHNIQUE: Multiple spot images obtained with the fluoroscopic device and submitted for interpretation post-procedure. FLUOROSCOPY TIME:  3 minutes, 22 seconds COMPARISON:  CT abdomen pelvis - 06/06/2016 ; intraoperative cholangiogram during laparoscopic cholecystectomy -  03/27/2016 FINDINGS: A single spot intraoperative fluoroscopic image of the right upper abdominal quadrant is provided for review  Provided image demonstrates an endoscope overlying the right upper abdominal quadrant. There is faint opacification of the common bile duct. IMPRESSION: Single image of the right upper abdominal quadrant during ERCP. Correlation with the operative report is recommended. These images were submitted for radiologic interpretation only. Please see the procedural report for the amount of contrast and the fluoroscopy time utilized. Electronically Signed   By: Sandi Mariscal M.D.   On: 06/08/2016 10:51       Subjective: Feeling well, was constipated. Had multiples BM overnight.  No abdominal pain, no further BM.   Discharge Exam: Vitals:   06/09/16 0603 06/09/16 1018  BP: 100/76 136/72  Pulse: 76   Resp: 18   Temp: 98 F (36.7 C)    Vitals:   06/08/16 1300 06/08/16 2115 06/09/16 0603 06/09/16 1018  BP: (!) 103/49 (!) 150/77 100/76 136/72  Pulse: 92 (!) 111 76   Resp:  16 18   Temp: 97.7 F (36.5 C) 97.9 F (36.6 C) 98 F (36.7 C)   TempSrc: Oral Oral Oral   SpO2: 97% 97% 98%     General: Pt is alert, awake, not in acute distress Cardiovascular: RRR, S1/S2 +, no rubs, no gallops Respiratory: CTA bilaterally, no wheezing, no rhonchi Abdominal: Soft, NT, ND, bowel sounds + Extremities: no edema, no cyanosis    The results of significant diagnostics from this hospitalization (including imaging, microbiology, ancillary and laboratory) are listed below for reference.     Microbiology: No results found for this or any previous visit (from the past 240 hour(s)).   Labs: BNP (last 3 results) No results for input(s): BNP in the last 8760 hours. Basic Metabolic Panel:  Recent Labs Lab 06/06/16 1817 06/07/16 0608 06/09/16 0258  NA 139 138 137  K 3.7 3.9 3.7  CL 106 104 105  CO2 23 25 25   GLUCOSE 109* 121* 92  BUN 16 12 10   CREATININE 1.12 1.04 1.00  CALCIUM 9.5 8.7* 8.4*   Liver Function Tests:  Recent Labs Lab 06/06/16 1817 06/07/16 0608  AST 26 26  ALT 15* 14*   ALKPHOS 93 80  BILITOT 0.7 0.6  PROT 9.1* 7.2  ALBUMIN 4.7 3.7    Recent Labs Lab 06/06/16 1817 06/07/16 0608  LIPASE 92* 32   No results for input(s): AMMONIA in the last 168 hours. CBC:  Recent Labs Lab 06/06/16 1817 06/07/16 0608 06/09/16 0258  WBC 12.4* 9.4 4.8  NEUTROABS 8.9* 7.4  --   HGB 12.5* 11.1* 9.3*  HCT 35.6* 32.4* 27.7*  MCV 95.2 93.9 93.3  PLT 211 191 138*   Cardiac Enzymes:  Recent Labs Lab 06/07/16 0608 06/07/16 1049 06/07/16 1903 06/08/16 0004 06/08/16 0537  TROPONINI 1.17* 1.17* 0.89* 0.78* 0.61*   BNP: Invalid input(s): POCBNP CBG: No results for input(s): GLUCAP in the last 168 hours. D-Dimer No results for input(s): DDIMER in the last 72 hours. Hgb A1c No results for input(s): HGBA1C in the last 72 hours. Lipid Profile No results for input(s): CHOL, HDL, LDLCALC, TRIG, CHOLHDL, LDLDIRECT in the last 72 hours. Thyroid function studies No results for input(s): TSH, T4TOTAL, T3FREE, THYROIDAB in the last 72 hours.  Invalid input(s): FREET3 Anemia work up No results for input(s): VITAMINB12, FOLATE, FERRITIN, TIBC, IRON, RETICCTPCT in the last 72 hours. Urinalysis    Component Value Date/Time   COLORURINE AMBER (A) 12/03/2015 1759  APPEARANCEUR HAZY (A) 12/03/2015 1759   APPEARANCEUR Clear 11/17/2013 0927   LABSPEC 1.019 12/03/2015 1759   LABSPEC 1.005 11/17/2013 0927   PHURINE 5.0 12/03/2015 1759   GLUCOSEU NEGATIVE 12/03/2015 1759   GLUCOSEU Negative 11/17/2013 0927   HGBUR NEGATIVE 12/03/2015 1759   BILIRUBINUR 1+ (A) 12/03/2015 1759   BILIRUBINUR Negative 11/17/2013 0927   KETONESUR NEGATIVE 12/03/2015 1759   PROTEINUR 30 (A) 12/03/2015 1759   NITRITE NEGATIVE 12/03/2015 1759   LEUKOCYTESUR 1+ (A) 12/03/2015 1759   LEUKOCYTESUR Negative 11/17/2013 0927   Sepsis Labs Invalid input(s): PROCALCITONIN,  WBC,  LACTICIDVEN Microbiology No results found for this or any previous visit (from the past 240 hour(s)).   Time  coordinating discharge: Over 30 minutes  SIGNED:   Elmarie Shiley, MD  [facility] 06/09/2016, 11:45 AM Pager [phone]  If 7PM-7AM, please contact night-coverage [email or web address] Password [email or web address]

## 2016-06-10 ENCOUNTER — Encounter (HOSPITAL_COMMUNITY): Payer: Self-pay | Admitting: Internal Medicine

## 2016-06-13 ENCOUNTER — Encounter: Payer: Self-pay | Admitting: Internal Medicine

## 2016-06-13 NOTE — Progress Notes (Signed)
Duodenal ulceration - probably from ASA PPI was Rxed No recall Letter created

## 2016-07-30 ENCOUNTER — Telehealth: Payer: Self-pay | Admitting: Cardiovascular Disease

## 2016-07-30 NOTE — Telephone Encounter (Signed)
Paperwork given to MD who is aware of 12/11 procedure.

## 2016-07-30 NOTE — Telephone Encounter (Signed)
Left message for [facility] with updated information

## 2016-07-30 NOTE — Telephone Encounter (Signed)
Received clearance request from [facility] [place], [phone] for Dec 11 right and left eye cataract extraction w/lens implantation.  In Sharon's in basket for MD review

## 2016-07-31 NOTE — Telephone Encounter (Signed)
Faxed clearance and most recent OV notes to [facility], [phone]

## 2016-09-02 DIAGNOSIS — K2981 Duodenitis with bleeding: Secondary | ICD-10-CM | POA: Diagnosis not present

## 2016-09-02 DIAGNOSIS — I2510 Atherosclerotic heart disease of native coronary artery without angina pectoris: Secondary | ICD-10-CM | POA: Diagnosis not present

## 2016-09-02 DIAGNOSIS — I5031 Acute diastolic (congestive) heart failure: Secondary | ICD-10-CM | POA: Diagnosis not present

## 2016-09-02 DIAGNOSIS — F5104 Psychophysiologic insomnia: Secondary | ICD-10-CM | POA: Diagnosis not present

## 2016-09-02 DIAGNOSIS — Z23 Encounter for immunization: Secondary | ICD-10-CM | POA: Diagnosis not present

## 2016-09-02 DIAGNOSIS — Z85038 Personal history of other malignant neoplasm of large intestine: Secondary | ICD-10-CM | POA: Diagnosis not present

## 2016-09-02 DIAGNOSIS — E7800 Pure hypercholesterolemia, unspecified: Secondary | ICD-10-CM | POA: Diagnosis not present

## 2016-09-02 DIAGNOSIS — I10 Essential (primary) hypertension: Secondary | ICD-10-CM | POA: Diagnosis not present

## 2016-09-02 DIAGNOSIS — R918 Other nonspecific abnormal finding of lung field: Secondary | ICD-10-CM | POA: Diagnosis not present

## 2016-09-02 DIAGNOSIS — K219 Gastro-esophageal reflux disease without esophagitis: Secondary | ICD-10-CM | POA: Diagnosis not present

## 2016-09-02 DIAGNOSIS — R634 Abnormal weight loss: Secondary | ICD-10-CM | POA: Diagnosis not present

## 2016-11-19 ENCOUNTER — Telehealth: Payer: Self-pay | Admitting: Cardiovascular Disease

## 2016-11-19 ENCOUNTER — Other Ambulatory Visit: Payer: Self-pay | Admitting: Cardiovascular Disease

## 2016-11-19 MED ORDER — LISINOPRIL 20 MG PO TABS: 20.0000 mg | ORAL_TABLET | Freq: Every day | ORAL | 0 refills | Status: DC

## 2016-11-19 NOTE — Telephone Encounter (Signed)
-----   Message from Kittie Plater, LPN sent at 03/07/4579 11:09 AM EDT ----- Pt needs f/u appt with [facility]. Thanks!

## 2016-11-19 NOTE — Telephone Encounter (Signed)
Sent letter to patient/numbers were no longer in service  Will await to hear back from patient

## 2017-03-05 ENCOUNTER — Telehealth: Payer: Self-pay | Admitting: Cardiovascular Disease

## 2017-03-05 ENCOUNTER — Encounter: Payer: Self-pay | Admitting: Cardiovascular Disease

## 2017-03-05 NOTE — Telephone Encounter (Signed)
3 attempts to schedule fu appt from recall list.   Deleting recall. Invalid number mailed letter  6 month fu per ckout 05/30/16  Fletcher Anon

## 2017-04-02 ENCOUNTER — Other Ambulatory Visit: Payer: Self-pay | Admitting: Cardiovascular Disease

## 2017-04-18 IMAGING — CT CT ABD-PELV W/ CM
1 of 3 series · 14 of 32 positions shown, 19 images · IV contrast (omnipaque)
Comparison: None.

CLINICAL DATA: Abdominal pain since 8 o'clock last night. Vomiting
and diarrhea.

EXAM:
CT ABDOMEN AND PELVIS WITH CONTRAST
TECHNIQUE: Multidetector CT imaging of the abdomen and pelvis was performed
using the standard protocol following bolus administration of
intravenous contrast.
CONTRAST:  80mL OMNIPAQUE IOHEXOL 300 MG/ML  SOLN

[Series 2: routine abd pel with · axial · 0.72mm/px · z∈[-1348,-873]mm · 14 of 107 slices shown, 19 images]
[im 6/107  soft-tissue]
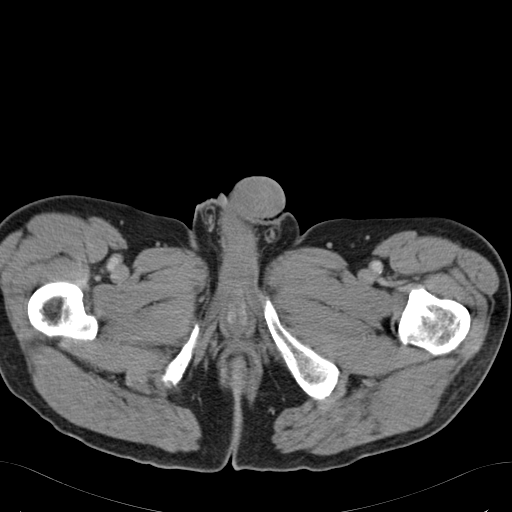
[im 6/107  bone]
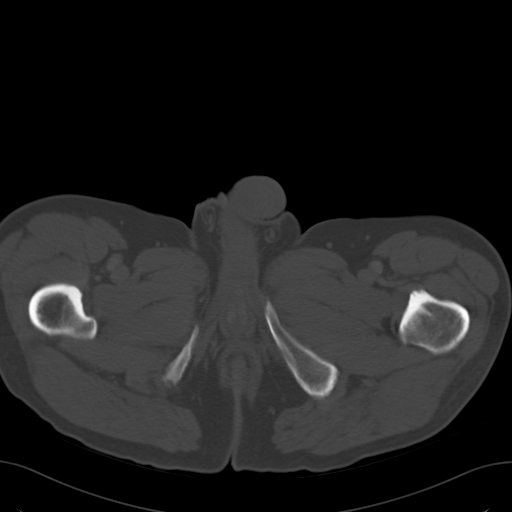
[im 16/107  soft-tissue]
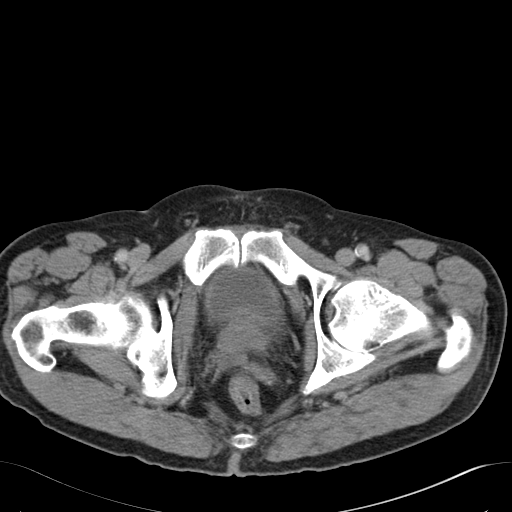
[im 22/107  soft-tissue]
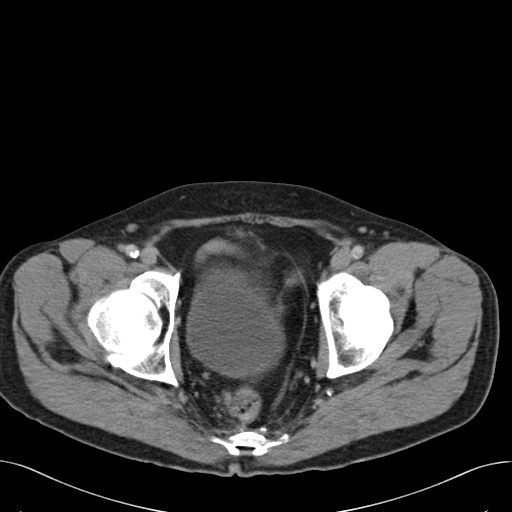
[im 32/107  soft-tissue]
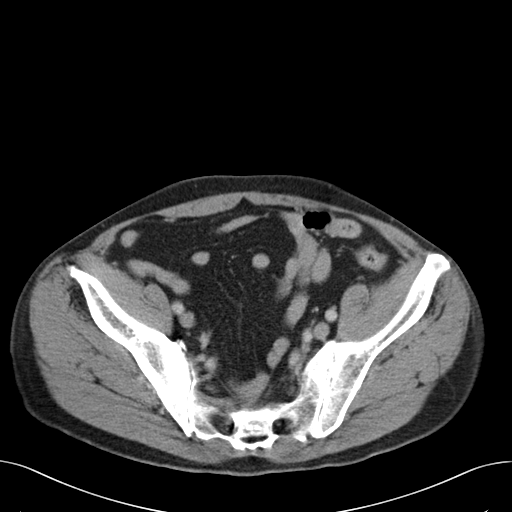
[im 38/107  soft-tissue]
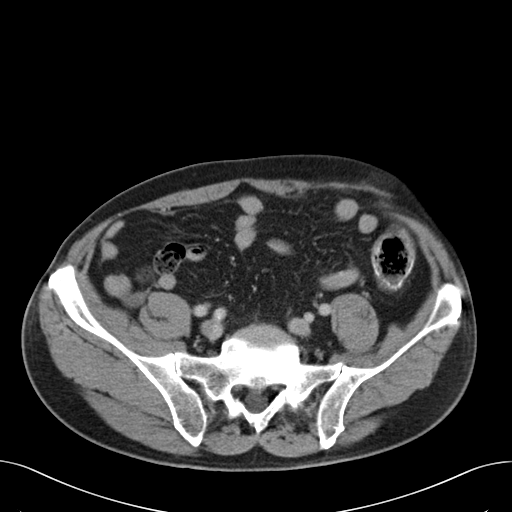
[im 48/107  soft-tissue]
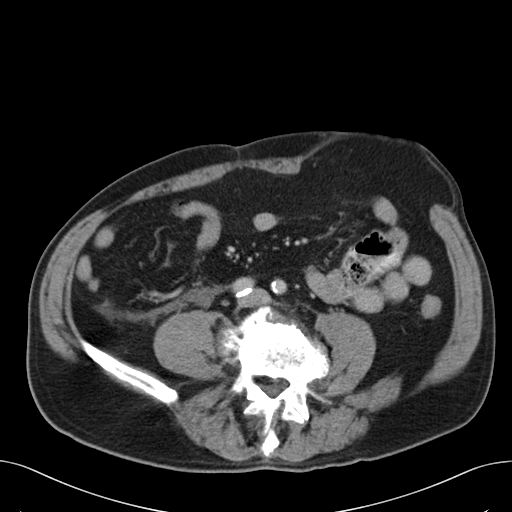
[im 54/107  soft-tissue]
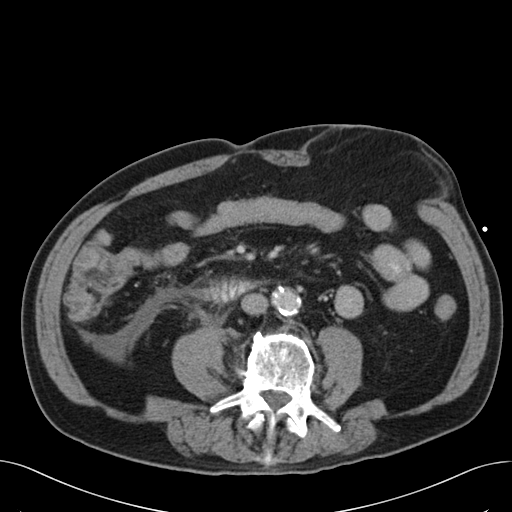
[im 59/107  soft-tissue]
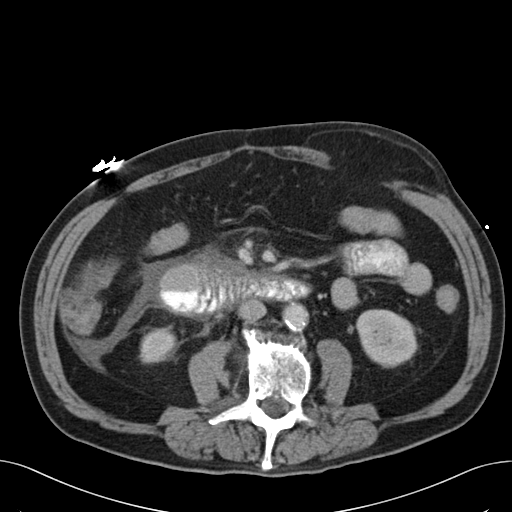
[im 69/107  soft-tissue]
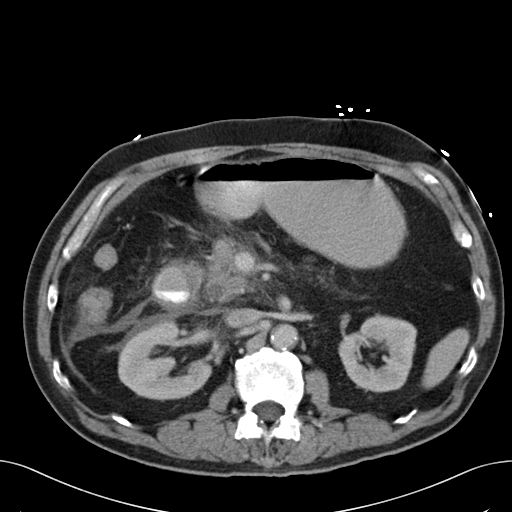
[im 69/107  bone]
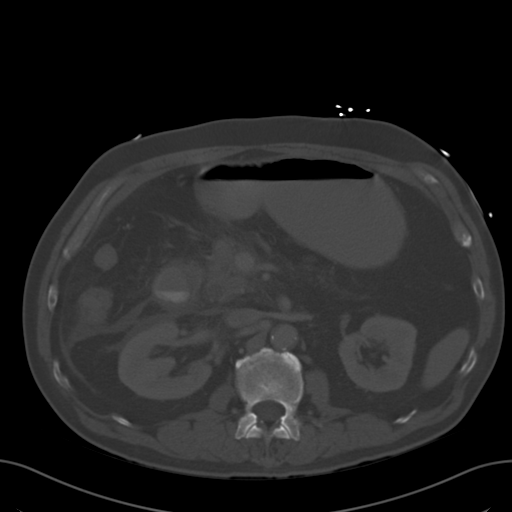
[im 75/107  soft-tissue]
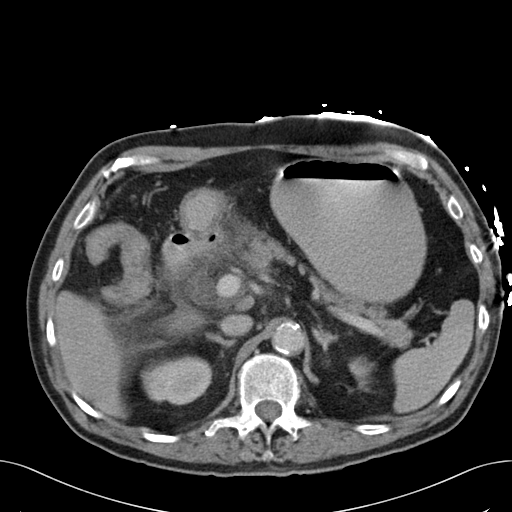
[im 85/107  soft-tissue]
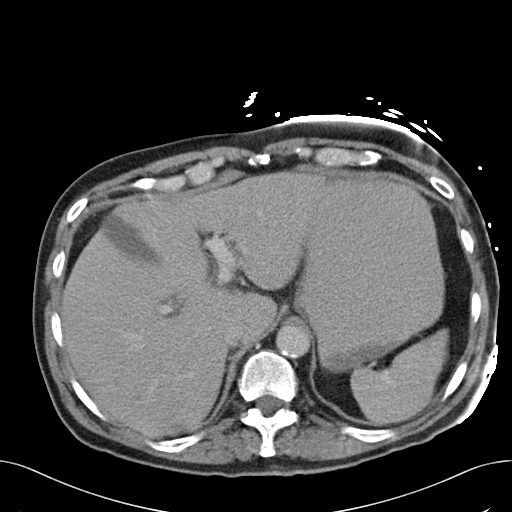
[im 85/107  lung]
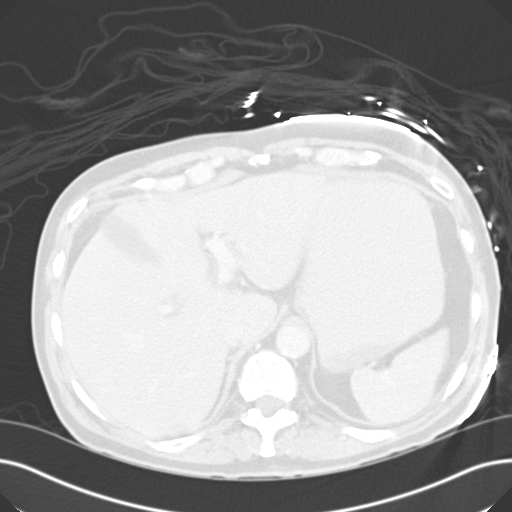
[im 91/107  soft-tissue]
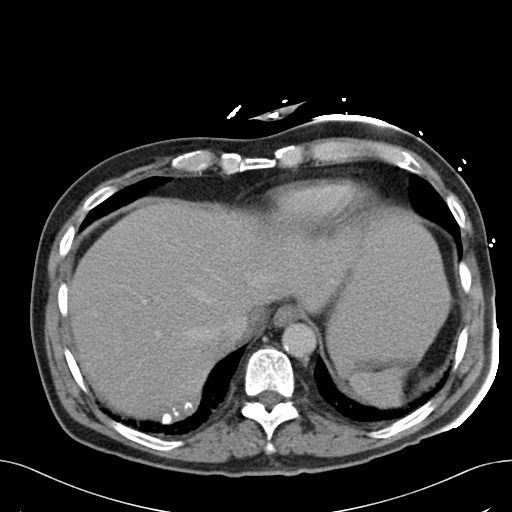
[im 91/107  lung]
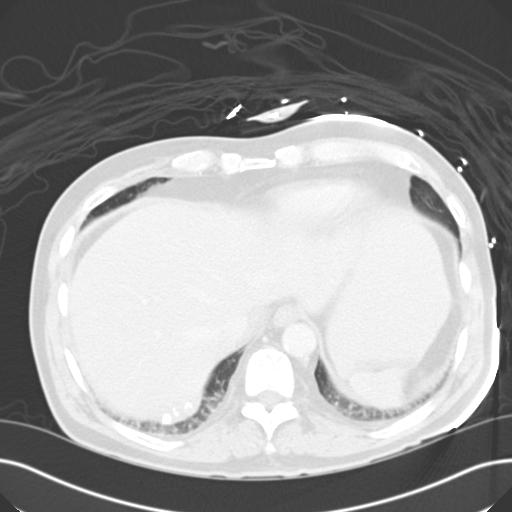
[im 96/107  lung]
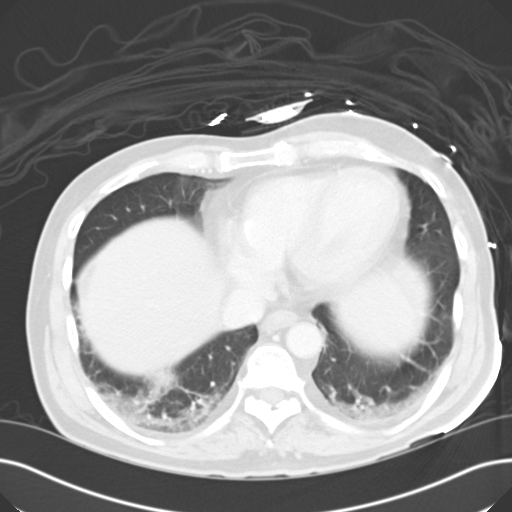
[im 101/107  soft-tissue]
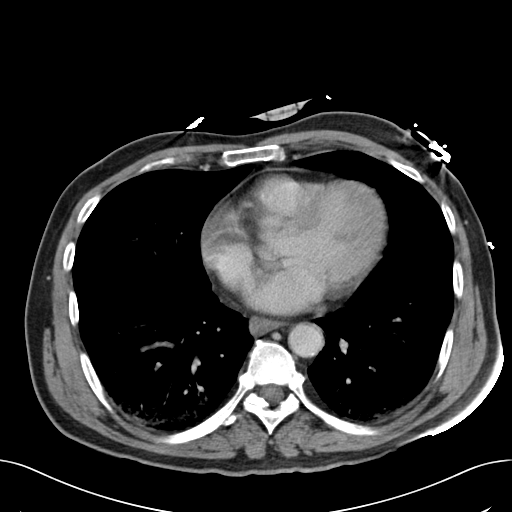
[im 101/107  lung]
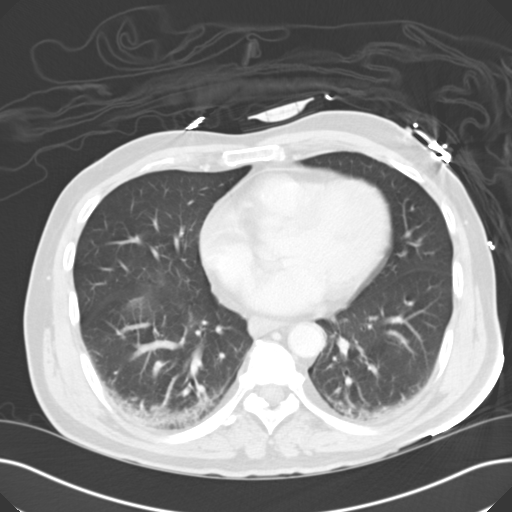

[14 of 32 positions shown; findings below may reference images not displayed]

FINDINGS: Lower chest: Mild bibasilar atelectasis. The mild right basilar
calcified pleural plaque with a 4.2 x 1.5 cm nodular area adjacently
which may reflect focal pleural thickening versus fibrosis. Normal
heart size. Mild coronary artery atherosclerotic cyst in the LAD.

Hepatobiliary: Normal liver. Cholelithiasis with gallbladder wall
thickening with pericholecystic fluid and surrounding inflammatory
changes .

Pancreas: Normal.

Spleen: Normal.

Adrenals/Urinary Tract: Normal adrenal glands. Normal kidneys.
Normal bladder.

Stomach/Bowel: No bowel wall thickening or dilatation. No
pneumatosis, pneumoperitoneum or portal venous gas. Small amount of
abdominal free fluid extending into the right pericolic gutter.

Vascular/Lymphatic: Normal caliber abdominal aorta with
atherosclerosis. No lymphadenopathy.

Other: No fluid collection or hematoma.

Musculoskeletal: No lytic or sclerotic osseous lesion. No aggressive
osseous abnormality. Degenerative disc disease with disc height loss
at L2-3, L3-4 and L4-5 with bilateral facet arthropathy.
IMPRESSION: 1. Cholelithiasis with gallbladder wall thickening with
pericholecystic fluid and surrounding inflammatory changes most
concerning for acute cholecystitis.
2. Mild right basilar calcified pleural plaque with a 4.2 x 1.5 cm
nodular area adjacently which may reflect focal pleural thickening
versus fibrosis. Recommend follow-up CT chest in 3-6 months.

## 2017-04-18 IMAGING — XA IR CHOLECYSTOSTOMY
3 series · 3 of 3 positions shown · non-contrast
Comparison: CT of the abdomen earlier today

CLINICAL DATA: Acute calculus cholecystitis. The patient is at high
risk for immediate cholecystectomy and requires percutaneous
cholecystostomy tube placement.

EXAM:
PERCUTANEOUS CHOLECYSTOSTOMY

[Series 1: fl - angio · 1 of 1 slices shown (1 of 3)]
[im 1/1]
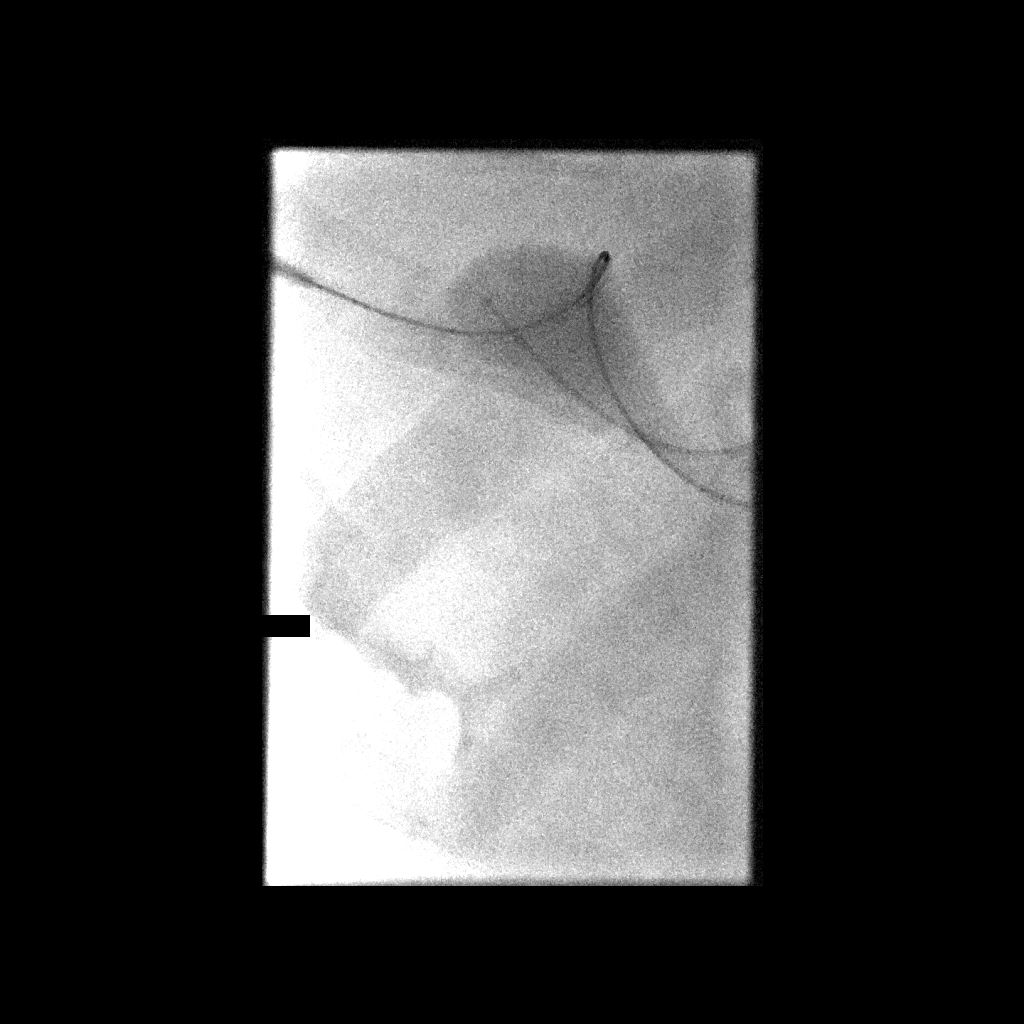

[Series 2: fl - angio · 1 of 1 slices shown (2 of 3)]
[im 1/1]
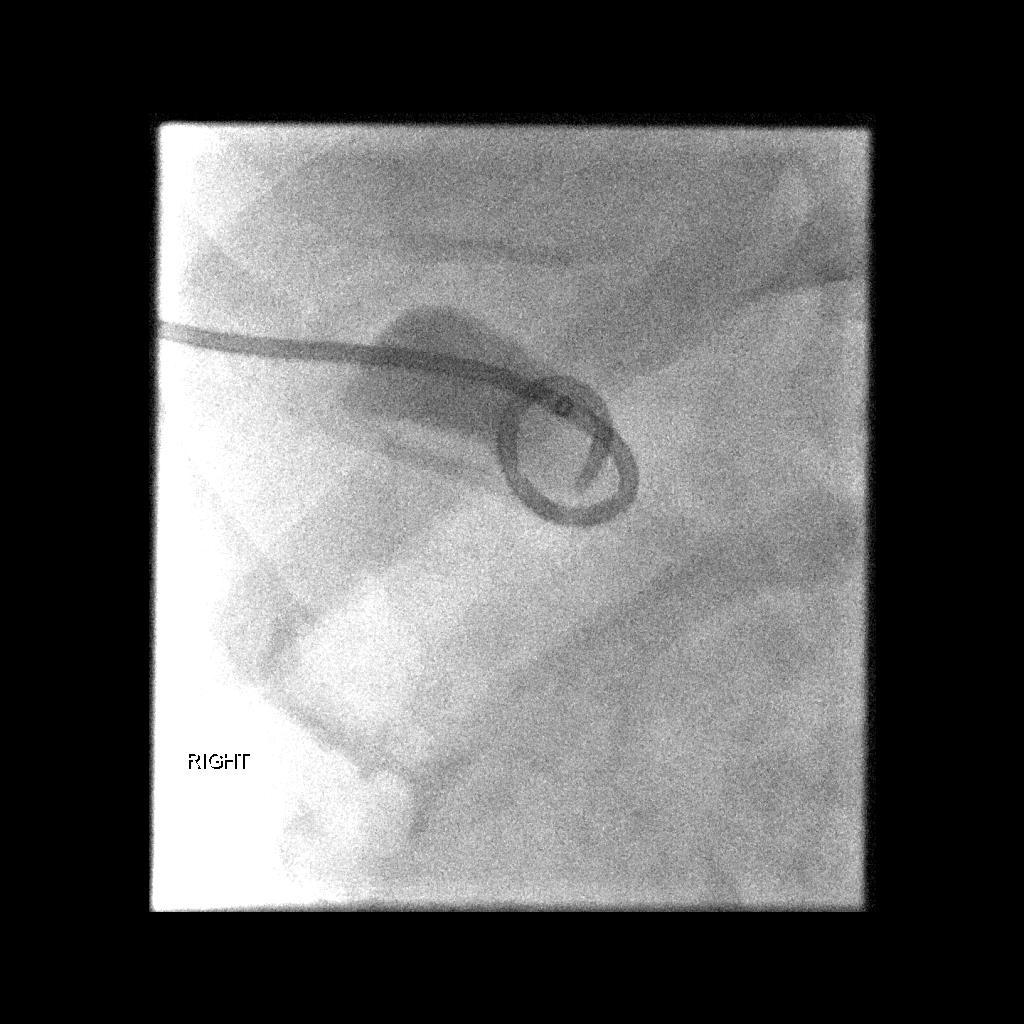

[Series 3: fl - angio · 1 of 1 slices shown (3 of 3)]
[im 1/1]
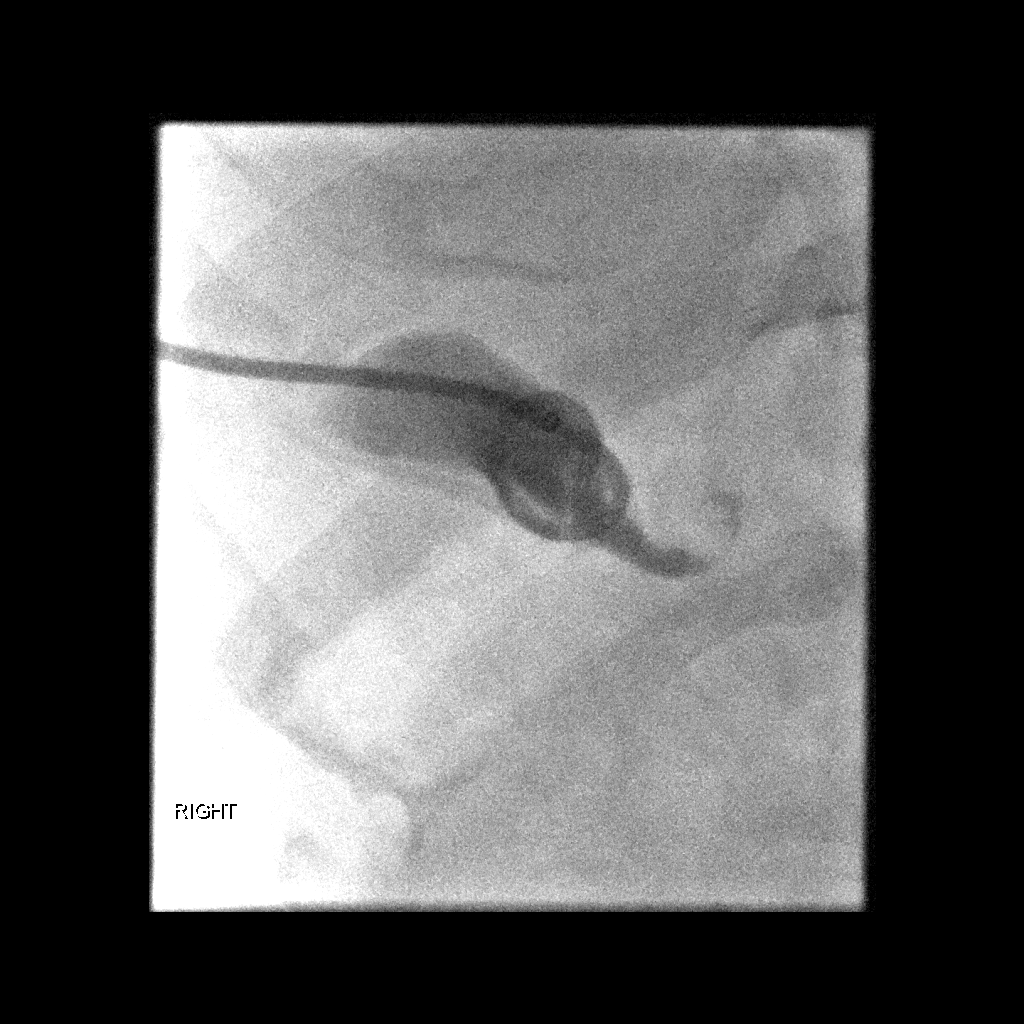

[3 of 3 positions shown; findings below may reference images not displayed]

ANESTHESIA/SEDATION:
3.5 mg IV Versed; 125 mcg IV Fentanyl.

Total Moderate Sedation Time

15 minutes.

The patient's level of consciousness and physiologic status were
continuously monitored during the procedure by Radiology nursing.

CONTRAST:  5mL OMNIPAQUE IOHEXOL 300 MG/ML  SOLN

MEDICATIONS:
No additional medications.

FLUOROSCOPY TIME:  30 seconds.

PROCEDURE:
The procedure, risks, benefits, and alternatives were explained to
the patient. Questions regarding the procedure were encouraged and
answered. The patient understands and consents to the procedure. A
time-out was performed prior to the procedure. Ultrasound was used
to localize the gallbladder.

The right abdominal wall was prepped with chlorhexidine in a sterile
fashion, and a sterile drape was applied covering the operative
field. A sterile gown and sterile gloves were used for the
procedure. Local anesthesia was provided with 1% Lidocaine.
Ultrasound image documentation was performed. Fluoroscopy during the
procedure and fluoro spot radiograph confirms appropriate catheter
position.

Under direct ultrasound guidance, a 21 gauge needle was advanced
into the gallbladder lumen. Aspiration was performed and a bile
sample sent for culture studies. A small amount of diluted contrast
material was injected. A guide wire was then advanced into the
gallbladder. A transitional dilator was placed.

Percutaneous tract dilatation was then performed over a guide wire
to 10-French. A 10-French pigtail drainage catheter was then
advanced into the gallbladder lumen under fluoroscopy. Catheter was
formed and injected with contrast material to confirm position. The
catheter was flushed and connected to a gravity drainage bag. It was
secured at the skin with a Prolene retention suture and Stat-Lock
device.

COMPLICATIONS:
None
FINDINGS: A cholecystostomy tube was advanced into the gallbladder lumen and
formed. It is now draining bile. This tube will be left to gravity
drainage.
IMPRESSION: Percutaneous cholecystostomy with placement of 10-French drainage
catheter into the gallbladder lumen. This was left to gravity
drainage.

## 2017-04-29 ENCOUNTER — Ambulatory Visit (INDEPENDENT_AMBULATORY_CARE_PROVIDER_SITE_OTHER): Payer: Medicare Other | Admitting: Nurse Practitioner

## 2017-04-29 ENCOUNTER — Encounter: Payer: Self-pay | Admitting: Nurse Practitioner

## 2017-04-29 VITALS — BP 120/68 | HR 67 | Ht 74.0 in | Wt 170.8 lb

## 2017-04-29 DIAGNOSIS — I2510 Atherosclerotic heart disease of native coronary artery without angina pectoris: Secondary | ICD-10-CM

## 2017-04-29 DIAGNOSIS — I10 Essential (primary) hypertension: Secondary | ICD-10-CM

## 2017-04-29 DIAGNOSIS — E785 Hyperlipidemia, unspecified: Secondary | ICD-10-CM | POA: Diagnosis not present

## 2017-04-29 MED ORDER — ASPIRIN EC 81 MG PO TBEC: 81.0000 mg | DELAYED_RELEASE_TABLET | Freq: Every day | ORAL | 3 refills | Status: AC

## 2017-04-29 MED ORDER — NITROGLYCERIN 0.4 MG SL SUBL: 0.4000 mg | SUBLINGUAL_TABLET | SUBLINGUAL | 3 refills | Status: AC | PRN

## 2017-04-29 NOTE — Progress Notes (Signed)
Office Visit    Patient Name: Preston Bell Date of Encounter: 04/29/2017  Primary Care Provider:  System, [place] Not In Primary Cardiologist:  M. Fletcher Anon, MD   Chief Complaint    76 year old male with a prior history of coronary artery disease status post coronary artery bypass grafting 3 in February 2017, HFpEF GERD, hypertension, and hyperlipidemia, who presents for follow-up.  Past Medical History    Past Medical History:  Diagnosis Date  . (HFpEF) heart failure with preserved ejection fraction ([place])    a. 09/2015 Echo: EF 55-60%, no rwma, Gr1 DD, triv AI, mildly dil LA, mild TR, PASP nl.  . Alcohol abuse   . Anemia   . Anxiety   . CAD, multiple vessel 10/10/2015   a. 09/2015 NSTEMI/Cath: LM 90, LAD 90ost, D2 90, LCX 80ost, RCA 62m-->CABG x 3 (LIMA->LAD, VG->OM, VG->RCA).  . Cancer ([place])    Lung and Colon  . Gallstone pancreatitis   . GERD (gastroesophageal reflux disease)   . History of colectomy   . HLD (hyperlipidemia)   . Hypercalcemia   . Hypertension   . NSTEMI (non-ST elevated myocardial infarction) ([place]) 09/2015  . Panic disorder   . Pleural effusion    Right Lung  . Pulmonary nodule    a. 02/2016 CT Chest: Partially calcified right basilar pleural plaque and bibasilar subpleural fibrosis, unchanged. Likely sequela prior asbestos exposure. No new pulm masses or nodules.  . Sepsis ([place])   . SVT (supraventricular tachycardia) (HCC)    Past Surgical History:  Procedure Laterality Date  . CARDIAC CATHETERIZATION N/A 10/09/2015   Procedure: Left Heart Cath and Coronary Angiography;  Surgeon: [place] Hampshire, MD;  Location: [facility];  Service: Cardiovascular;  Laterality: N/A;  . CHOLECYSTECTOMY N/A 03/27/2016   Procedure: LAPAROSCOPIC CHOLECYSTECTOMY WITH INTRAOPERATIVE CHOLANGIOGRAM;  Surgeon: Clayburn Pert, MD;  Location: [facility];  Service: General;  Laterality: N/A;  . COLON SURGERY  2012   Colectomy ([place])  . COLOSTOMY REVERSAL  2013  .  CORONARY ARTERY BYPASS GRAFT N/A 10/13/2015   Procedure: CORONARY ARTERY BYPASS GRAFTING (CABG), ON PUMP, TIMES THREE, USING LEFT INTERNAL MAMMARY ARTERY, RIGHT GREATER SAPHENOUS VEIN HARVESTED ENDOSCOPICALLY;  Surgeon: Ivin Poot, MD;  Location: [facility];  Service: Open Heart Surgery;  Laterality: N/A;  LIMA-LAD; SVG-OM; SVG-RCA  . CORONARY ARTERY BYPASS GRAFT     x 3 Vessels  . ERCP N/A 06/08/2016   Procedure: ENDOSCOPIC RETROGRADE CHOLANGIOPANCREATOGRAPHY (ERCP);  Surgeon: Gatha Mayer, MD;  Location: [facility] END[facility]COPY;  Service: Endoscopy;  Laterality: N/A;  . KNEE ARTHROSCOPY     Right-2011 left-2012  . TEE WITHOUT CARDIOVERSION N/A 10/13/2015   Procedure: TRANSESOPHAGEAL ECHOCARDIOGRAM (TEE);  Surgeon: Ivin Poot, MD;  Location: [place];  Service: Open Heart Surgery;  Laterality: N/A;  . THORACENTESIS      Allergies  No Known Allergies  History of Present Illness    76 year old male with the above complex past medical history including coronary artery disease, hypertension, hyperlipidemia, GERD, remote tobacco and alcohol abuse, and HFpEF. He was last seen in clinic in October 2017. Since then, he reports doing well. He has remained active in his yard and goes for a walk every weekend without experiencing any chest pain or dyspnea. He feels as though he has good exercise tolerance. He denies palpitations, PND, orthopnea, dizziness, syncope, edema, or early satiety.  Home Medications    Prior to Admission medications   Medication Sig Start Date End Date  Taking? Authorizing Provider  atorvastatin (LIPITOR) 20 MG tablet Take 1 tablet (20 mg total) by mouth every morning. 10/19/15 04/29/17 Yes Gold, Wilder Glade, PA-C  HYDROcodone-acetaminophen (NORCO/VICODIN) 5-325 MG tablet Take 1-2 tablets by mouth every 4 (four) hours as needed for moderate pain. 03/28/16  Yes Clayburn Pert, MD  lisinopril (PRINIVIL,ZESTRIL) 20 MG tablet TAKE 1 TABLET BY MOUTH ONCE DAILY *CALL  OFFICE  TO  SCHEDULE  FOLLOW   UP  APPOINTMENT* 04/03/17  Yes [place] Hampshire, MD  metoprolol (LOPRESSOR) 50 MG tablet Take 1 tablet (50 mg total) by mouth 2 (two) times daily. Patient taking differently: Take 50 mg by mouth daily.  10/19/15  Yes Gold, Wayne E, PA-C  mirtazapine (REMERON) 15 MG tablet Take 15 mg by mouth at bedtime. 06/14/15 04/29/17 Yes [provider]  pantoprazole (PROTONIX) 40 MG tablet Take 1 tablet (40 mg total) by mouth 2 (two) times daily. 06/09/16  Yes Regalado, Belkys A, MD  aspirin EC 81 MG tablet Take 1 tablet (81 mg total) by mouth daily. 04/29/17   Rogelia Mire, NP  nitroGLYCERIN (NITROSTAT) 0.4 MG SL tablet Place 1 tablet (0.4 mg total) under the tongue every 5 (five) minutes as needed for chest pain. MAXIMUM OF 3 DOSES. 04/29/17 07/28/17  Rogelia Mire, NP    Review of Systems    As above, he has been doing well.  He denies chest pain, palpitations, dyspnea, pnd, orthopnea, n, v, dizziness, syncope, edema, weight gain, or early satiety.  All other systems reviewed and are otherwise negative except as noted above.  Physical Exam    VS:  BP 120/68 (BP Location: Left Arm, Patient Position: Sitting, Cuff Size: Normal)   Pulse 67   Ht 6\' 2"  (1.88 m)   Wt 170 lb 12 oz (77.5 kg)   BMI 21.92 kg/m  , BMI Body mass index is 21.92 kg/m. GEN: Well nourished, well developed, in no acute distress.  HEENT: normal.  Neck: Supple, no JVD, carotid bruits, or masses. Cardiac: RRR, no murmurs, rubs, or gallops. No clubbing, cyanosis, edema.  Radials/DP/PT 2+ and equal bilaterally.  Respiratory:  Respirations regular and unlabored, clear to auscultation bilaterally. GI: Soft, nontender, nondistended, BS + x 4. MS: no deformity or atrophy. Skin: warm and dry, no rash. Neuro:  Strength and sensation are intact. Psych: Normal affect.  Accessory Clinical Findings    ECG - regular sinus rhythm, 67, no acute ST or T changes.  Assessment & Plan    1.  Coronary artery disease Status post  coronary artery bypass grafting in 2017. He has done well over the past 11 months. He remains active without experiencing chest pain or dyspnea. He remains on aspirin, statin, beta blocker, and ACE inhibitor therapy.I will reduce his aspirin 81 mg daily.  2. Essential hypertension: Blood pressure stable on beta blocker and ACE inhibitor therapy.  3. Hyperlipidemia:  LDL was 46 in February 2017. He remains on statin therapy. I will follow-up lipids and LFTs. He is not fasting today and will come back another day for this.  4. Disposition: Follow-up lipids and LFTs.  Follow-up with Dr. Fletcher Anon in 6 months or sooner if necessary.   [place] Hodgkins, NP 04/29/2017, 1:53 PM

## 2017-04-29 NOTE — Patient Instructions (Signed)
Medication Instructions:  Your physician has recommended you make the following change in your medication:  1- DECREASE Aspirin to 81 mg by mouth once a day. 2- TAKE Nitroglycerin 0.4 (1 tablet) under the tongue every 5 minutes for a MAXIMUM of 3 doses.  If a single episode of chest pain is not relieved by one tablet, the patient will try another within 5 minutes for a maximum of 3 doses; and if this doesn't relieve the pain, the patient is instructed to call 911 for transportation to an emergency department. Make sure you are sitting when you take the nitro, as it may cause a drop in blood pressure.   Labwork: Your physician recommends that you return for lab work in: AT [place] WILL NEED TO BE FASTING. DO NOT EAT OR DRINK after midnight the morning of the medications. - Please go to the [facility]. You will check in at the front desk to the right as you walk into the atrium. Valet Parking is offered if needed.    Testing/Procedures: NONE  Follow-Up: Your physician wants you to follow-up in: [place]. You will receive a reminder letter in the mail two months in advance. If you don't receive a letter, please call our office to schedule the follow-up appointment.   If you need a refill on your cardiac medications before your next appointment, please call your pharmacy.

## 2017-05-05 ENCOUNTER — Other Ambulatory Visit: Payer: Self-pay | Admitting: Cardiovascular Disease

## 2017-06-10 DIAGNOSIS — H43811 Vitreous degeneration, right eye: Secondary | ICD-10-CM | POA: Diagnosis not present

## 2017-06-10 DIAGNOSIS — H4311 Vitreous hemorrhage, right eye: Secondary | ICD-10-CM | POA: Diagnosis not present

## 2017-06-11 ENCOUNTER — Encounter (INDEPENDENT_AMBULATORY_CARE_PROVIDER_SITE_OTHER): Payer: Medicare Other | Admitting: Ophthalmology

## 2017-06-11 DIAGNOSIS — H35033 Hypertensive retinopathy, bilateral: Secondary | ICD-10-CM | POA: Diagnosis not present

## 2017-06-11 DIAGNOSIS — H43813 Vitreous degeneration, bilateral: Secondary | ICD-10-CM | POA: Diagnosis not present

## 2017-06-11 DIAGNOSIS — I10 Essential (primary) hypertension: Secondary | ICD-10-CM | POA: Diagnosis not present

## 2017-06-11 DIAGNOSIS — H4311 Vitreous hemorrhage, right eye: Secondary | ICD-10-CM

## 2017-06-25 ENCOUNTER — Encounter (INDEPENDENT_AMBULATORY_CARE_PROVIDER_SITE_OTHER): Payer: Medicare Other | Admitting: Ophthalmology

## 2017-06-25 DIAGNOSIS — I10 Essential (primary) hypertension: Secondary | ICD-10-CM

## 2017-06-25 DIAGNOSIS — H4311 Vitreous hemorrhage, right eye: Secondary | ICD-10-CM | POA: Diagnosis not present

## 2017-06-25 DIAGNOSIS — H43813 Vitreous degeneration, bilateral: Secondary | ICD-10-CM | POA: Diagnosis not present

## 2017-06-25 DIAGNOSIS — H2512 Age-related nuclear cataract, left eye: Secondary | ICD-10-CM

## 2017-06-25 DIAGNOSIS — H35033 Hypertensive retinopathy, bilateral: Secondary | ICD-10-CM

## 2017-06-25 NOTE — H&P (Signed)
Preston ROGERSON is an 76 y.o. male.   Chief Complaint: Large floaters in the vision right eye HPI: Large floaters in the vision right eye for 2+ weeks.  Past Medical History:  Diagnosis Date  . (HFpEF) heart failure with preserved ejection fraction ([place])    a. 09/2015 Echo: EF 55-60%, no rwma, Gr1 DD, triv AI, mildly dil LA, mild TR, PASP nl.  . Alcohol abuse   . Anemia   . Anxiety   . CAD, multiple vessel 10/10/2015   a. 09/2015 NSTEMI/Cath: LM 90, LAD 90ost, D2 90, LCX 80ost, RCA 42m-->CABG x 3 (LIMA->LAD, VG->OM, VG->RCA).  . Cancer ([place])    Lung and Colon  . Gallstone pancreatitis   . GERD (gastroesophageal reflux disease)   . History of colectomy   . HLD (hyperlipidemia)   . Hypercalcemia   . Hypertension   . NSTEMI (non-ST elevated myocardial infarction) ([place]) 09/2015  . Panic disorder   . Pleural effusion    Right Lung  . Pulmonary nodule    a. 02/2016 CT Chest: Partially calcified right basilar pleural plaque and bibasilar subpleural fibrosis, unchanged. Likely sequela prior asbestos exposure. No new pulm masses or nodules.  . Sepsis ([place])   . SVT (supraventricular tachycardia) (HCC)     Past Surgical History:  Procedure Laterality Date  . CARDIAC CATHETERIZATION N/A 10/09/2015   Procedure: Left Heart Cath and Coronary Angiography;  Surgeon: [place] Hampshire, MD;  Location: [place] [facility];  Service: Cardiovascular;  Laterality: N/A;  . CHOLECYSTECTOMY N/A 03/27/2016   Procedure: LAPAROSCOPIC CHOLECYSTECTOMY WITH INTRAOPERATIVE CHOLANGIOGRAM;  Surgeon: Clayburn Pert, MD;  Location: [facility] ORS;  Service: General;  Laterality: N/A;  . COLON SURGERY  2012   Colectomy ([place])  . COLOSTOMY REVERSAL  2013  . CORONARY ARTERY BYPASS GRAFT N/A 10/13/2015   Procedure: CORONARY ARTERY BYPASS GRAFTING (CABG), ON PUMP, TIMES THREE, USING LEFT INTERNAL MAMMARY ARTERY, RIGHT GREATER SAPHENOUS VEIN HARVESTED ENDOSCOPICALLY;  Surgeon: Ivin Poot, MD;  Location: [place];   Service: Open Heart Surgery;  Laterality: N/A;  LIMA-LAD; SVG-OM; SVG-RCA  . CORONARY ARTERY BYPASS GRAFT     x 3 Vessels  . ERCP N/A 06/08/2016   Procedure: ENDOSCOPIC RETROGRADE CHOLANGIOPANCREATOGRAPHY (ERCP);  Surgeon: Gatha Mayer, MD;  Location: [facility] ENDOSCOPY;  Service: Endoscopy;  Laterality: N/A;  . KNEE ARTHROSCOPY     Right-2011 left-2012  . TEE WITHOUT CARDIOVERSION N/A 10/13/2015   Procedure: TRANSESOPHAGEAL ECHOCARDIOGRAM (TEE);  Surgeon: Ivin Poot, MD;  Location: [place];  Service: Open Heart Surgery;  Laterality: N/A;  . THORACENTESIS      Family History  Problem Relation Age of Onset  . Cervical cancer Mother   . Lung cancer Father   . Lung cancer Brother   . Hypertension Brother   . Hypertension Son    Social History:  reports that he quit smoking about 33 years ago. He has a 30.00 pack-year smoking history. He has never used smokeless tobacco. He reports that he uses drugs, including Marijuana, about 3 times per week. He reports that he does not drink alcohol.  Allergies: No Known Allergies  No prescriptions prior to admission.    Review of systems otherwise negative  There were no vitals taken for this visit.  Physical exam: Mental status: oriented x3. Eyes: See eye exam associated with this date of surgery in media tab.  Scanned in by scanning center Ears, Nose, Throat: within normal limits Neck: Within Normal limits General: within normal limits  Chest: Within normal limits Breast: deferred Heart: Within normal limits Abdomen: Within normal limits GU: deferred Extremities: within normal limits Skin: within normal limits  Assessment/Plan Vitreous hemorrhage right eye. Plan: To [facility] for pars plana vitrectomy, laser, gas, right eye.  Hayden Pedro 06/25/2017, 3:52 PM

## 2017-06-30 NOTE — Progress Notes (Signed)
Anesthesia Chart Review:  Pt is a same day work up.   Pt is a 76 year old Bell scheduled for R pars plana vitrectomy on 07/01/2017 with Tempie Hoist, MD  - Cardiologist is Kathlyn Sacramento, MD. Last office visit 04/29/17 with [place] Hodgkins, NP  PMH includes:  CAD (s/p CABG 10/13/15), HFpEF, SVT, HTN, hyperlipidemia, pulmonary nodule, anemia. Hx alcohol abuse. Former smoker.  S/p cholecystectomy 03/27/16.   Medications include: ASA 81 mg, Lipitor, lisinopril, metoprolol, Protonix  Labs will be obtained today of surgery.  EKG 04/29/17: NSR  TEE (post CABG) 10/13/15:  - Left ventricle: The cavity size was normal. Wall thickness was increased in a pattern of mild LVH. Systolic function was normal. The estimated ejection fraction was in the range of Preston% to 60%. Wall motion was normal; there were no regional wall motion abnormalities. - Aortic valve: There was trivial regurgitation. - Mitral valve: There was mild regurgitation. - Left atrium: The atrium was dilated. - Right atrium: No evidence of thrombus in the atrial cavity or appendage. - Atrial septum: No defect or patent foramen ovale was identified. Echo contrast study showed no right-to-left atrial level shunt, following an increase in RA pressure induced by provocative maneuvers. - Pulmonic valve: No evidence of vegetation.  Carotid duplex 10/13/15:  - Right - 40% to 59% ICA stenosis. Vertebral artery flow is antegrade. - Left - 40% to 59% ICA stenosis. Vertebral artery flow is antegrade.  Last cardiac cath 10/09/15 done prior to CABG.   If labs acceptable day of surgery, I anticipate pt can proceed as scheduled.   Willeen Cass, FNP-BC [facility] Stay [facility]/Anesthesiology Phone: [phone] 06/30/2017 1:38 PM

## 2017-07-01 ENCOUNTER — Ambulatory Visit (HOSPITAL_COMMUNITY): Payer: Medicare Other | Admitting: Emergency Medicine

## 2017-07-01 ENCOUNTER — Ambulatory Visit (HOSPITAL_COMMUNITY)
Admission: RE | Admit: 2017-07-01 | Discharge: 2017-07-02 | Disposition: A | Discharge status: Home / Self Care | Payer: Medicare Other | Source: Ambulatory Visit | Attending: Ophthalmology | Admitting: Ophthalmology

## 2017-07-01 ENCOUNTER — Encounter (HOSPITAL_COMMUNITY): Payer: Self-pay

## 2017-07-01 ENCOUNTER — Encounter (HOSPITAL_COMMUNITY)
Admission: RE | Disposition: A | Discharge status: Home / Self Care | Payer: Self-pay | Source: Ambulatory Visit | Attending: Ophthalmology

## 2017-07-01 DIAGNOSIS — I10 Essential (primary) hypertension: Secondary | ICD-10-CM | POA: Insufficient documentation

## 2017-07-01 DIAGNOSIS — E785 Hyperlipidemia, unspecified: Secondary | ICD-10-CM | POA: Diagnosis not present

## 2017-07-01 DIAGNOSIS — I2510 Atherosclerotic heart disease of native coronary artery without angina pectoris: Secondary | ICD-10-CM | POA: Diagnosis not present

## 2017-07-01 DIAGNOSIS — H43811 Vitreous degeneration, right eye: Secondary | ICD-10-CM | POA: Insufficient documentation

## 2017-07-01 DIAGNOSIS — I252 Old myocardial infarction: Secondary | ICD-10-CM | POA: Diagnosis not present

## 2017-07-01 DIAGNOSIS — F410 Panic disorder [episodic paroxysmal anxiety] without agoraphobia: Secondary | ICD-10-CM | POA: Diagnosis not present

## 2017-07-01 DIAGNOSIS — K219 Gastro-esophageal reflux disease without esophagitis: Secondary | ICD-10-CM | POA: Insufficient documentation

## 2017-07-01 DIAGNOSIS — H4311 Vitreous hemorrhage, right eye: Secondary | ICD-10-CM | POA: Diagnosis present

## 2017-07-01 DIAGNOSIS — Z85038 Personal history of other malignant neoplasm of large intestine: Secondary | ICD-10-CM | POA: Insufficient documentation

## 2017-07-01 DIAGNOSIS — Z9049 Acquired absence of other specified parts of digestive tract: Secondary | ICD-10-CM | POA: Diagnosis not present

## 2017-07-01 DIAGNOSIS — E039 Hypothyroidism, unspecified: Secondary | ICD-10-CM | POA: Insufficient documentation

## 2017-07-01 DIAGNOSIS — Z85118 Personal history of other malignant neoplasm of bronchus and lung: Secondary | ICD-10-CM | POA: Insufficient documentation

## 2017-07-01 DIAGNOSIS — Z87891 Personal history of nicotine dependence: Secondary | ICD-10-CM | POA: Diagnosis not present

## 2017-07-01 DIAGNOSIS — J449 Chronic obstructive pulmonary disease, unspecified: Secondary | ICD-10-CM | POA: Diagnosis not present

## 2017-07-01 DIAGNOSIS — Z951 Presence of aortocoronary bypass graft: Secondary | ICD-10-CM | POA: Diagnosis not present

## 2017-07-01 HISTORY — PX: PARS PLANA VITRECTOMY 27 GAUGE: SHX6738

## 2017-07-01 HISTORY — PX: PHOTOCOAGULATION: SHX5303

## 2017-07-01 HISTORY — DX: Hypothyroidism, unspecified: E03.9

## 2017-07-01 HISTORY — PX: AIR/FLUID EXCHANGE: SHX6494

## 2017-07-01 LAB — BASIC METABOLIC PANEL
Anion gap: 8 (ref 5–15)
BUN: 12 mg/dL (ref 6–20)
CHLORIDE: 107 mmol/L (ref 101–111)
CO2: 25 mmol/L (ref 22–32)
CREATININE: 1.03 mg/dL (ref 0.61–1.24)
Calcium: 9.1 mg/dL (ref 8.9–10.3)
GFR calc Af Amer: >60 mL/min (ref >60)
GFR calc non Af Amer: >60 mL/min (ref >60)
Glucose, Bld: 85 mg/dL (ref 65–99)
POTASSIUM: 4.3 mmol/L (ref 3.5–5.1)
Sodium: 140 mmol/L (ref 135–145)

## 2017-07-01 LAB — CBC
HEMATOCRIT: 33.4 % — AB (ref 39.0–52.0)
HEMOGLOBIN: 10.7 g/dL — AB (ref 13.0–17.0)
MCH: 31.3 pg (ref 26.0–34.0)
MCHC: 32 g/dL (ref 30.0–36.0)
MCV: 97.7 fL (ref 78.0–100.0)
Platelets: 212 10*3/uL (ref 150–400)
RBC: 3.42 MIL/uL — ABNORMAL LOW (ref 4.22–5.81)
RDW: 15.9 % — ABNORMAL HIGH (ref 11.5–15.5)
WBC: 5.7 10*3/uL (ref 4.0–10.5)

## 2017-07-01 SURGERY — PARS PLANA VITRECTOMY 27 GAUGE
Anesthesia: General | Site: Eye | Laterality: Right

## 2017-07-01 MED ORDER — BUPIVACAINE HCL (PF) 0.75 % IJ SOLN
INTRAMUSCULAR | Status: AC
  Filled 2017-07-01: qty 10

## 2017-07-01 MED ORDER — STERILE WATER FOR IRRIGATION IR SOLN
Status: DC | PRN
  Administered 2017-07-01: 1000 mL

## 2017-07-01 MED ORDER — TRIAMCINOLONE ACETONIDE 40 MG/ML IJ SUSP
INTRAMUSCULAR | Status: AC
  Filled 2017-07-01: qty 5

## 2017-07-01 MED ORDER — BACITRACIN-POLYMYXIN B 500-10000 UNIT/GM OP OINT
TOPICAL_OINTMENT | OPHTHALMIC | Status: AC
  Filled 2017-07-01: qty 3.5

## 2017-07-01 MED ORDER — DEXAMETHASONE SODIUM PHOSPHATE 10 MG/ML IJ SOLN
INTRAMUSCULAR | Status: DC | PRN
  Administered 2017-07-01: 10 mg via INTRAVENOUS

## 2017-07-01 MED ORDER — HYALURONIDASE HUMAN 150 UNIT/ML IJ SOLN
INTRAMUSCULAR | Status: AC
  Filled 2017-07-01: qty 1

## 2017-07-01 MED ORDER — METOPROLOL TARTRATE 50 MG PO TABS
50.0000 mg | ORAL_TABLET | Freq: Two times a day (BID) | ORAL | Status: DC
  Administered 2017-07-01: 50 mg via ORAL
  Filled 2017-07-01: qty 1

## 2017-07-01 MED ORDER — ROCURONIUM BROMIDE 10 MG/ML (PF) SYRINGE
PREFILLED_SYRINGE | INTRAVENOUS | Status: DC | PRN
  Administered 2017-07-01: 50 mg via INTRAVENOUS

## 2017-07-01 MED ORDER — BUPIVACAINE HCL (PF) 0.75 % IJ SOLN
INTRAMUSCULAR | Status: DC | PRN
  Administered 2017-07-01: 10 mL

## 2017-07-01 MED ORDER — LISINOPRIL 5 MG PO TABS: 5.0000 mg | ORAL_TABLET | Freq: Every day | ORAL | Status: DC

## 2017-07-01 MED ORDER — PHENYLEPHRINE HCL 2.5 % OP SOLN
OPHTHALMIC | Status: AC
  Administered 2017-07-01: 1 [drp] via OPHTHALMIC
  Filled 2017-07-01: qty 2

## 2017-07-01 MED ORDER — CYCLOPENTOLATE HCL 1 % OP SOLN
1.0000 [drp] | OPHTHALMIC | Status: AC | PRN
  Administered 2017-07-01 (×3): 1 [drp] via OPHTHALMIC
  Filled 2017-07-01: qty 2

## 2017-07-01 MED ORDER — BACITRACIN-POLYMYXIN B 500-10000 UNIT/GM OP OINT
1.0000 "application " | TOPICAL_OINTMENT | Freq: Three times a day (TID) | OPHTHALMIC | Status: DC
  Administered 2017-07-01: 1 via OPHTHALMIC
  Filled 2017-07-01: qty 3.5

## 2017-07-01 MED ORDER — DEXAMETHASONE SODIUM PHOSPHATE 10 MG/ML IJ SOLN
INTRAMUSCULAR | Status: AC
  Filled 2017-07-01: qty 1

## 2017-07-01 MED ORDER — CYCLOPENTOLATE HCL 1 % OP SOLN
OPHTHALMIC | Status: AC
  Administered 2017-07-01: 1 [drp] via OPHTHALMIC
  Filled 2017-07-01: qty 2

## 2017-07-01 MED ORDER — HYDROCODONE-ACETAMINOPHEN 5-325 MG PO TABS
1.0000 | ORAL_TABLET | ORAL | Status: DC | PRN
Start: 2017-07-01 — End: 2017-07-02
  Administered 2017-07-01: 2 via ORAL
  Filled 2017-07-01: qty 2

## 2017-07-01 MED ORDER — GATIFLOXACIN 0.5 % OP SOLN
1.0000 [drp] | Freq: Four times a day (QID) | OPHTHALMIC | Status: DC
  Administered 2017-07-02: 1 [drp] via OPHTHALMIC
  Filled 2017-07-01: qty 2.5

## 2017-07-01 MED ORDER — GLYCOPYRROLATE 0.2 MG/ML IV SOSY
PREFILLED_SYRINGE | INTRAVENOUS | Status: DC | PRN
  Administered 2017-07-01: .2 mg via INTRAVENOUS

## 2017-07-01 MED ORDER — MAGNESIUM HYDROXIDE 400 MG/5ML PO SUSP: 15.0000 mL | Freq: Four times a day (QID) | ORAL | Status: DC | PRN

## 2017-07-01 MED ORDER — PROMETHAZINE HCL 25 MG/ML IJ SOLN: 6.2500 mg | INTRAMUSCULAR | Status: DC | PRN

## 2017-07-01 MED ORDER — TROPICAMIDE 1 % OP SOLN
1.0000 [drp] | OPHTHALMIC | Status: AC | PRN
  Administered 2017-07-01 (×3): 1 [drp] via OPHTHALMIC
  Filled 2017-07-01: qty 2

## 2017-07-01 MED ORDER — PANTOPRAZOLE SODIUM 40 MG PO TBEC
40.0000 mg | DELAYED_RELEASE_TABLET | Freq: Two times a day (BID) | ORAL | Status: DC
  Administered 2017-07-01: 40 mg via ORAL
  Filled 2017-07-01: qty 1

## 2017-07-01 MED ORDER — FENTANYL CITRATE (PF) 250 MCG/5ML IJ SOLN
INTRAMUSCULAR | Status: AC
  Filled 2017-07-01: qty 5

## 2017-07-01 MED ORDER — DEXAMETHASONE SODIUM PHOSPHATE 10 MG/ML IJ SOLN
INTRAMUSCULAR | Status: DC | PRN
Start: 2017-07-01 — End: 2017-07-01
  Administered 2017-07-01: 10 mg

## 2017-07-01 MED ORDER — BSS IO SOLN
INTRAOCULAR | Status: DC | PRN
  Administered 2017-07-01: 15 mL

## 2017-07-01 MED ORDER — STERILE WATER FOR INJECTION IJ SOLN
INTRAMUSCULAR | Status: DC | PRN
  Administered 2017-07-01: 20 mL

## 2017-07-01 MED ORDER — LATANOPROST 0.005 % OP SOLN
1.0000 [drp] | Freq: Every day | OPHTHALMIC | Status: DC
  Filled 2017-07-01: qty 2.5

## 2017-07-01 MED ORDER — SODIUM CHLORIDE 0.45 % IV SOLN
INTRAVENOUS | Status: DC
  Administered 2017-07-01: 16:00:00 via INTRAVENOUS

## 2017-07-01 MED ORDER — ATORVASTATIN CALCIUM 20 MG PO TABS
20.0000 mg | ORAL_TABLET | ORAL | Status: DC
  Administered 2017-07-02: 20 mg via ORAL
  Filled 2017-07-01: qty 1

## 2017-07-01 MED ORDER — SODIUM HYALURONATE 10 MG/ML IO SOLN
INTRAOCULAR | Status: DC | PRN
  Administered 2017-07-01: 0.85 mL via INTRAOCULAR

## 2017-07-01 MED ORDER — SUGAMMADEX SODIUM 200 MG/2ML IV SOLN
INTRAVENOUS | Status: DC | PRN
  Administered 2017-07-01: 200 mg via INTRAVENOUS

## 2017-07-01 MED ORDER — MORPHINE SULFATE (PF) 4 MG/ML IV SOLN: 1.0000 mg | INTRAVENOUS | Status: DC | PRN

## 2017-07-01 MED ORDER — HYPROMELLOSE (GONIOSCOPIC) 2.5 % OP SOLN
OPHTHALMIC | Status: AC
  Filled 2017-07-01: qty 15

## 2017-07-01 MED ORDER — FENTANYL CITRATE (PF) 100 MCG/2ML IJ SOLN: 25.0000 ug | INTRAMUSCULAR | Status: DC | PRN

## 2017-07-01 MED ORDER — NITROGLYCERIN 0.4 MG SL SUBL: 0.4000 mg | SUBLINGUAL_TABLET | SUBLINGUAL | Status: DC | PRN

## 2017-07-01 MED ORDER — GATIFLOXACIN 0.5 % OP SOLN
1.0000 [drp] | OPHTHALMIC | Status: AC | PRN
  Administered 2017-07-01 (×3): 1 [drp] via OPHTHALMIC

## 2017-07-01 MED ORDER — BSS IO SOLN
INTRAOCULAR | Status: AC
  Filled 2017-07-01: qty 15

## 2017-07-01 MED ORDER — MIDAZOLAM HCL 2 MG/2ML IJ SOLN: 0.5000 mg | Freq: Once | INTRAMUSCULAR | Status: DC | PRN

## 2017-07-01 MED ORDER — ACETAMINOPHEN 325 MG PO TABS: 325.0000 mg | ORAL_TABLET | ORAL | Status: DC | PRN

## 2017-07-01 MED ORDER — EPHEDRINE SULFATE-NACL 50-0.9 MG/10ML-% IV SOSY
PREFILLED_SYRINGE | INTRAVENOUS | Status: DC | PRN
  Administered 2017-07-01: 5 mg via INTRAVENOUS

## 2017-07-01 MED ORDER — EPINEPHRINE PF 1 MG/ML IJ SOLN
INTRAMUSCULAR | Status: AC
  Filled 2017-07-01: qty 1

## 2017-07-01 MED ORDER — SODIUM CHLORIDE 0.9 % IJ SOLN
INTRAMUSCULAR | Status: AC
  Filled 2017-07-01: qty 10

## 2017-07-01 MED ORDER — LIDOCAINE HCL 2 % IJ SOLN
INTRAMUSCULAR | Status: AC
  Filled 2017-07-01: qty 20

## 2017-07-01 MED ORDER — ACETAZOLAMIDE SODIUM 500 MG IJ SOLR
INTRAMUSCULAR | Status: AC
  Filled 2017-07-01: qty 500

## 2017-07-01 MED ORDER — PROPOFOL 10 MG/ML IV BOLUS
INTRAVENOUS | Status: DC | PRN
  Administered 2017-07-01: 130 mg via INTRAVENOUS

## 2017-07-01 MED ORDER — SODIUM HYALURONATE 10 MG/ML IO SOLN
INTRAOCULAR | Status: AC
  Filled 2017-07-01: qty 0.85

## 2017-07-01 MED ORDER — STERILE WATER FOR INJECTION IJ SOLN
INTRAMUSCULAR | Status: AC
  Filled 2017-07-01: qty 20

## 2017-07-01 MED ORDER — MIRTAZAPINE 15 MG PO TABS
15.0000 mg | ORAL_TABLET | Freq: Every day | ORAL | Status: DC
  Administered 2017-07-01: 15 mg via ORAL
  Filled 2017-07-01: qty 1

## 2017-07-01 MED ORDER — LATANOPROST 0.005 % OP SOLN
1.0000 [drp] | Freq: Every day | OPHTHALMIC | Status: DC
  Administered 2017-07-02: 1 [drp] via OPHTHALMIC
  Filled 2017-07-01: qty 2.5

## 2017-07-01 MED ORDER — SODIUM CHLORIDE 0.9 % IV SOLN
INTRAVENOUS | Status: DC | PRN
  Administered 2017-07-01 (×2): via INTRAVENOUS

## 2017-07-01 MED ORDER — POLYMYXIN B SULFATE 500000 UNITS IJ SOLR
INTRAMUSCULAR | Status: AC
  Filled 2017-07-01: qty 500000

## 2017-07-01 MED ORDER — PROPOFOL 10 MG/ML IV BOLUS
INTRAVENOUS | Status: AC
Start: 2017-07-01 — End: ?
  Filled 2017-07-01: qty 20

## 2017-07-01 MED ORDER — PHENYLEPHRINE HCL 2.5 % OP SOLN
1.0000 [drp] | OPHTHALMIC | Status: AC | PRN
  Administered 2017-07-01 (×3): 1 [drp] via OPHTHALMIC
  Filled 2017-07-01: qty 2

## 2017-07-01 MED ORDER — TEMAZEPAM 15 MG PO CAPS: 15.0000 mg | ORAL_CAPSULE | Freq: Every evening | ORAL | Status: DC | PRN

## 2017-07-01 MED ORDER — EPINEPHRINE PF 1 MG/ML IJ SOLN
INTRAMUSCULAR | Status: DC | PRN
  Administered 2017-07-01: 500 mL

## 2017-07-01 MED ORDER — CEFAZOLIN SODIUM-DEXTROSE 2-4 GM/100ML-% IV SOLN
2.0000 g | INTRAVENOUS | Status: AC
  Administered 2017-07-01: 2 g via INTRAVENOUS
  Filled 2017-07-01: qty 100

## 2017-07-01 MED ORDER — BACITRACIN-POLYMYXIN B 500-10000 UNIT/GM OP OINT
TOPICAL_OINTMENT | OPHTHALMIC | Status: DC | PRN
  Administered 2017-07-01: 1 via OPHTHALMIC

## 2017-07-01 MED ORDER — BRIMONIDINE TARTRATE 0.2 % OP SOLN
1.0000 [drp] | Freq: Two times a day (BID) | OPHTHALMIC | Status: DC
  Administered 2017-07-02: 1 [drp] via OPHTHALMIC
  Filled 2017-07-01: qty 5

## 2017-07-01 MED ORDER — DORZOLAMIDE HCL 2 % OP SOLN
1.0000 [drp] | Freq: Three times a day (TID) | OPHTHALMIC | Status: DC
  Administered 2017-07-01: 1 [drp] via OPHTHALMIC
  Filled 2017-07-01: qty 10

## 2017-07-01 MED ORDER — TETRACAINE HCL 0.5 % OP SOLN
2.0000 [drp] | Freq: Once | OPHTHALMIC | Status: AC
  Administered 2017-07-02: 2 [drp] via OPHTHALMIC
  Filled 2017-07-01: qty 4

## 2017-07-01 MED ORDER — MEPERIDINE HCL 25 MG/ML IJ SOLN: 6.2500 mg | INTRAMUSCULAR | Status: DC | PRN

## 2017-07-01 MED ORDER — BUPIVACAINE-EPINEPHRINE (PF) 0.25% -1:200000 IJ SOLN
INTRAMUSCULAR | Status: AC
  Filled 2017-07-01: qty 30

## 2017-07-01 MED ORDER — ATROPINE SULFATE 1 % OP SOLN
OPHTHALMIC | Status: AC
  Filled 2017-07-01: qty 5

## 2017-07-01 MED ORDER — HEMOSTATIC AGENTS (NO CHARGE) OPTIME
TOPICAL | Status: DC | PRN
  Administered 2017-07-01: 1 via TOPICAL

## 2017-07-01 MED ORDER — HYDROCODONE-ACETAMINOPHEN 5-325 MG PO TABS
1.0000 | ORAL_TABLET | ORAL | Status: DC | PRN
  Administered 2017-07-01: 2 via ORAL
  Filled 2017-07-01: qty 2

## 2017-07-01 MED ORDER — ACETAZOLAMIDE SODIUM 500 MG IJ SOLR
500.0000 mg | Freq: Once | INTRAMUSCULAR | Status: AC
  Administered 2017-07-02: 500 mg via INTRAVENOUS
  Filled 2017-07-01: qty 500

## 2017-07-01 MED ORDER — 0.9 % SODIUM CHLORIDE (POUR BTL) OPTIME
TOPICAL | Status: DC | PRN
  Administered 2017-07-01: 1000 mL

## 2017-07-01 MED ORDER — ONDANSETRON HCL 4 MG/2ML IJ SOLN: 4.0000 mg | Freq: Four times a day (QID) | INTRAMUSCULAR | Status: DC | PRN

## 2017-07-01 MED ORDER — CEFTAZIDIME 1 G IJ SOLR
INTRAMUSCULAR | Status: AC
  Filled 2017-07-01: qty 1

## 2017-07-01 MED ORDER — TROPICAMIDE 1 % OP SOLN
OPHTHALMIC | Status: AC
  Administered 2017-07-01: 1 [drp] via OPHTHALMIC
  Filled 2017-07-01: qty 15

## 2017-07-01 MED ORDER — ONDANSETRON HCL 4 MG/2ML IJ SOLN
INTRAMUSCULAR | Status: DC | PRN
  Administered 2017-07-01: 4 mg via INTRAVENOUS

## 2017-07-01 MED ORDER — BSS PLUS IO SOLN
INTRAOCULAR | Status: AC
  Filled 2017-07-01: qty 500

## 2017-07-01 MED ORDER — LIDOCAINE 2% (20 MG/ML) 5 ML SYRINGE
INTRAMUSCULAR | Status: DC | PRN
  Administered 2017-07-01: 40 mg via INTRAVENOUS

## 2017-07-01 MED ORDER — FENTANYL CITRATE (PF) 250 MCG/5ML IJ SOLN
INTRAMUSCULAR | Status: DC | PRN
  Administered 2017-07-01 (×2): 50 ug via INTRAVENOUS

## 2017-07-01 MED ORDER — PREDNISOLONE ACETATE 1 % OP SUSP
1.0000 [drp] | Freq: Four times a day (QID) | OPHTHALMIC | Status: DC
  Administered 2017-07-02: 1 [drp] via OPHTHALMIC
  Filled 2017-07-01: qty 5

## 2017-07-01 MED ORDER — GATIFLOXACIN 0.5 % OP SOLN
OPHTHALMIC | Status: AC
  Administered 2017-07-01: 1 [drp] via OPHTHALMIC
  Filled 2017-07-01: qty 2.5

## 2017-07-01 SURGICAL SUPPLY — 53 items
CANNULA ANTERIOR CHAMBER 27GA (MISCELLANEOUS) IMPLANT
CANNULA TROCAR 25GA VLV (OPHTHALMIC) IMPLANT
CANNULA VLV SOFT TIP 27GA (OPHTHALMIC) ×3 IMPLANT
COTTONBALL LRG STERILE PKG (GAUZE/BANDAGES/DRESSINGS) ×9 IMPLANT
DRAPE OPHTHALMIC 77X100 STRL (CUSTOM PROCEDURE TRAY) ×3 IMPLANT
FILTER BLUE MILLIPORE (MISCELLANEOUS) IMPLANT
FILTER STRAW FLUID ASPIR (MISCELLANEOUS) IMPLANT
FORCEPS ECKARDT ILM 25G SERR (OPHTHALMIC RELATED) IMPLANT
FORCEPS GRIESHABER ILM 27G (INSTRUMENTS) IMPLANT
GLOVE SS BIOGEL STRL SZ 6.5 (GLOVE) ×1 IMPLANT
GLOVE SS BIOGEL STRL SZ 7 (GLOVE) ×1 IMPLANT
GLOVE SUPERSENSE BIOGEL SZ 6.5 (GLOVE) ×2
GLOVE SUPERSENSE BIOGEL SZ 7 (GLOVE) ×2
GLOVE SURG 8.5 LATEX PF (GLOVE) ×3 IMPLANT
GOWN STRL REUS W/ TWL LRG LVL3 (GOWN DISPOSABLE) ×3 IMPLANT
GOWN STRL REUS W/TWL LRG LVL3 (GOWN DISPOSABLE) ×6
HANDLE PNEUMATIC FOR CONSTEL (OPHTHALMIC) IMPLANT
KIT BASIN OR (CUSTOM PROCEDURE TRAY) ×3 IMPLANT
KNIFE CRESCENT 2.5 55 ANG (BLADE) IMPLANT
MICROPICK 25G (MISCELLANEOUS)
NEEDLE 18GX1X1/2 (RX/OR ONLY) (NEEDLE) ×9 IMPLANT
NEEDLE 25GX 5/8IN NON SAFETY (NEEDLE) ×3 IMPLANT
NEEDLE FILTER BLUNT 18X 1/2SAF (NEEDLE) ×2
NEEDLE FILTER BLUNT 18X1 1/2 (NEEDLE) ×1 IMPLANT
NEEDLE HYPO 30X.5 LL (NEEDLE) IMPLANT
NS IRRIG 1000ML POUR BTL (IV SOLUTION) ×3 IMPLANT
PACK VITRECTOMY CUSTOM (CUSTOM PROCEDURE TRAY) ×3 IMPLANT
PAD ARMBOARD 7.5X6 YLW CONV (MISCELLANEOUS) ×6 IMPLANT
PAK VITRECTOMY PIK  27GA (OPHTHALMIC) ×2
PAK VITRECTOMY PIK 27GA (OPHTHALMIC) ×1 IMPLANT
PIC ILLUMINATED 25G (OPHTHALMIC) ×3
PICK MICROPICK 25G (MISCELLANEOUS) IMPLANT
PIK ILLUMINATED 25G (OPHTHALMIC) ×1 IMPLANT
PROBE LASER ILLUM FLEX 27GA (OPHTHALMIC) ×3 IMPLANT
PROBE LASER ILLUM FLEX CVD 25G (OPHTHALMIC) ×3 IMPLANT
ROLLS DENTAL (MISCELLANEOUS) ×6 IMPLANT
SCISSORS TIP ADVANCED DSP 25GA (INSTRUMENTS) IMPLANT
SCRAPER DIAMOND 25GA (OPHTHALMIC RELATED) IMPLANT
SCRAPER DIAMOND DUST MEMBRANE (MISCELLANEOUS) IMPLANT
SPONGE SURGIFOAM ABS GEL 12-7 (HEMOSTASIS) ×3 IMPLANT
STOPCOCK 4 WAY LG BORE MALE ST (IV SETS) IMPLANT
SUT CHROMIC 7 0 TG140 8 (SUTURE) IMPLANT
SUT ETHILON 10 0 CS140 6 (SUTURE) IMPLANT
SUT ETHILON 9 0 TG140 8 (SUTURE) IMPLANT
SUT POLY NON ABSORB 10-0 8 STR (SUTURE) IMPLANT
SUT SILK 4 0 RB 1 (SUTURE) IMPLANT
SYR 10ML LL (SYRINGE) IMPLANT
SYR 20CC LL (SYRINGE) ×6 IMPLANT
SYR BULB 3OZ (MISCELLANEOUS) ×3 IMPLANT
SYR TB 1ML LUER SLIP (SYRINGE) ×3 IMPLANT
TUBING HIGH PRESS EXTEN 6IN (TUBING) IMPLANT
WATER STERILE IRR 1000ML POUR (IV SOLUTION) ×3 IMPLANT
WIPE INSTRUMENT VISIWIPE 73X73 (MISCELLANEOUS) IMPLANT

## 2017-07-01 NOTE — Anesthesia Procedure Notes (Signed)
Procedure Name: Intubation Performed by: Valda Favia, CRNA Pre-anesthesia Checklist: Patient identified, Emergency Drugs available, Suction available, Patient being monitored and Timeout performed Patient Re-evaluated:Patient Re-evaluated prior to induction Oxygen Delivery Method: Circle system utilized Preoxygenation: Pre-oxygenation with 100% oxygen Induction Type: IV induction Ventilation: Mask ventilation without difficulty Laryngoscope Size: Mac and 4 Grade View: Grade I Tube type: Oral Tube size: 7.5 mm Number of attempts: 1 Airway Equipment and Method: Stylet Placement Confirmation: ETT inserted through vocal cords under direct vision,  positive ETCO2 and breath sounds checked- equal and bilateral Secured at: 21 cm Tube secured with: Tape Dental Injury: Teeth and Oropharynx as per pre-operative assessment

## 2017-07-01 NOTE — Anesthesia Postprocedure Evaluation (Signed)
Anesthesia Post Note  Patient: Preston Bell  Procedure(s) Performed: PARS PLANA VITRECTOMY 27 GAUGE (Right Eye) PHOTOCOAGULATION (Right Eye) AIR/FLUID EXCHANGE (Right Eye)     Patient location during evaluation: PACU Anesthesia Type: General Level of consciousness: awake and alert, patient cooperative and oriented Pain management: pain level controlled Vital Signs Assessment: post-procedure vital signs reviewed and stable Respiratory status: spontaneous breathing, nonlabored ventilation and respiratory function stable Cardiovascular status: blood pressure returned to baseline and stable Postop Assessment: no apparent nausea or vomiting Anesthetic complications: no    Last Vitals:  Vitals:   07/01/17 1336 07/01/17 1357  BP:  (!) 152/67  Pulse: 85 85  Resp: 16 16  Temp: (!) 36.1 C 36.6 C  SpO2: 93% 97%    Last Pain:  Vitals:   07/01/17 1654  TempSrc:   PainSc: 1                  Korrine Sicard,E. Stellar Gensel

## 2017-07-01 NOTE — H&P (Signed)
I examined the patient today and there is no change in the medical status 

## 2017-07-01 NOTE — Transfer of Care (Signed)
Immediate Anesthesia Transfer of Care Note  Patient: Preston Bell  Procedure(s) Performed: PARS PLANA VITRECTOMY 27 GAUGE (Right Eye) PHOTOCOAGULATION (Right Eye) AIR/FLUID EXCHANGE (Right Eye)  Patient Location: PACU  Anesthesia Type:General  Level of Consciousness: awake, alert  and oriented  Airway & Oxygen Therapy: Patient Spontanous Breathing and Patient connected to nasal cannula oxygen  Post-op Assessment: Report given to RN and Post -op Vital signs reviewed and stable  Post vital signs: Reviewed and stable  Last Vitals:  Vitals:   07/01/17 0908 07/01/17 1253  BP: (!) 140/59 (!) 153/69  Pulse: 72 86  Resp: 18 18  Temp: 36.6 C 36.6 C  SpO2: 99% 95%    Last Pain:  Vitals:   07/01/17 0908  TempSrc: Oral      Patients Stated Pain Goal: 1 (82/95/62 1308)  Complications: No apparent anesthesia complications

## 2017-07-01 NOTE — Progress Notes (Signed)
Patient arrived to 6S06 A&Ox4 and noted to have shield over right eye following a pars plana vitrectomy.  VSS, IV intact and infusing.  Patient complains of no pain at this time.  Family present at bedside.  Will continue to monitor.

## 2017-07-01 NOTE — Brief Op Note (Signed)
07/01/2017  12:48 PM  PATIENT:  Bralen Wiltgen [place]  76 y.o. male  PRE-OPERATIVE DIAGNOSIS:  vitreous hemorrhage right eye  POST-OPERATIVE DIAGNOSIS:  vitreous hemorrhage right eye  PROCEDURE:  Procedure(s): PARS PLANA VITRECTOMY 27 GAUGE (Right) PHOTOCOAGULATION (Right) AIR/FLUID EXCHANGE (Right)  SURGEON:  Surgeon(s) and Role:    * Hayden Pedro, MD - Primary  PHYSICIAN ASSISTANT:   Brief Operative note   Preoperative diagnosis:  vitreous hemorrhage right eye Postoperative diagnosis  * No Diagnosis Codes entered *  Procedures: as above  Surgeon:  Hayden Pedro, MD...  Assistant:  Deatra Ina SA    Anesthesia: General  Specimen: none  Estimated blood loss:  1cc  Complications: none  Patient sent to PACU in good condition  Composed by Hayden Pedro MD  Dictation number: [id number]

## 2017-07-01 NOTE — Anesthesia Preprocedure Evaluation (Addendum)
Anesthesia Evaluation  Patient identified by MRN, date of birth, ID band Patient awake    Reviewed: Allergy & Precautions, NPO status , Patient's Chart, lab work & pertinent test results  History of Anesthesia Complications Negative for: history of anesthetic complications  Airway Mallampati: I  TM Distance: >3 FB Neck ROM: Full    Dental  (+) Edentulous Upper, Edentulous Lower   Pulmonary COPD, former smoker (quit 1985),  H/o Lung cancer   breath sounds clear to auscultation       Cardiovascular hypertension, Pt. on medications and Pt. on home beta blockers (-) angina+ CAD and + CABG  + dysrhythmias Supra Ventricular Tachycardia  Rhythm:Regular Rate:Normal  '17 TEE: EF 55-60%   Neuro/Psych Anxiety negative neurological ROS     GI/Hepatic Neg liver ROS, GERD  Medicated and Controlled,H/o colon cancer   Endo/Other  Hypothyroidism   Renal/GU negative Renal ROS     Musculoskeletal   Abdominal   Peds  Hematology  (+) Blood dyscrasia (Hb 10.7), anemia ,   Anesthesia Other Findings   Reproductive/Obstetrics                            Anesthesia Physical Anesthesia Plan  ASA: III  Anesthesia Plan: General   Post-op Pain Management:    Induction: Intravenous  PONV Risk Score and Plan: 2 and Ondansetron and Dexamethasone  Airway Management Planned: Oral ETT  Additional Equipment:   Intra-op Plan:   Post-operative Plan: Extubation in OR  Informed Consent: I have reviewed the patients History and Physical, chart, labs and discussed the procedure including the risks, benefits and alternatives for the proposed anesthesia with the patient or authorized representative who has indicated his/her understanding and acceptance.   Dental advisory given  Plan Discussed with: CRNA and Surgeon  Anesthesia Plan Comments: (Plan routine monitors, GETA)        Anesthesia Quick  Evaluation

## 2017-07-02 ENCOUNTER — Encounter (HOSPITAL_COMMUNITY): Payer: Self-pay | Admitting: Ophthalmology

## 2017-07-02 DIAGNOSIS — J449 Chronic obstructive pulmonary disease, unspecified: Secondary | ICD-10-CM | POA: Diagnosis not present

## 2017-07-02 DIAGNOSIS — I2510 Atherosclerotic heart disease of native coronary artery without angina pectoris: Secondary | ICD-10-CM | POA: Diagnosis not present

## 2017-07-02 DIAGNOSIS — E785 Hyperlipidemia, unspecified: Secondary | ICD-10-CM | POA: Diagnosis not present

## 2017-07-02 DIAGNOSIS — H43811 Vitreous degeneration, right eye: Secondary | ICD-10-CM | POA: Diagnosis not present

## 2017-07-02 DIAGNOSIS — K219 Gastro-esophageal reflux disease without esophagitis: Secondary | ICD-10-CM | POA: Diagnosis not present

## 2017-07-02 DIAGNOSIS — I10 Essential (primary) hypertension: Secondary | ICD-10-CM | POA: Diagnosis not present

## 2017-07-02 MED ORDER — GATIFLOXACIN 0.5 % OP SOLN: 1.0000 [drp] | Freq: Four times a day (QID) | OPHTHALMIC | Status: DC

## 2017-07-02 MED ORDER — BACITRACIN-POLYMYXIN B 500-10000 UNIT/GM OP OINT: 1.0000 "application " | TOPICAL_OINTMENT | Freq: Three times a day (TID) | OPHTHALMIC | 0 refills | Status: DC

## 2017-07-02 MED ORDER — PREDNISOLONE ACETATE 1 % OP SUSP: 1.0000 [drp] | Freq: Four times a day (QID) | OPHTHALMIC | 0 refills | Status: DC

## 2017-07-02 NOTE — Discharge Summary (Signed)
Discharge summary not needed on [facility] patients per medical records. 

## 2017-07-02 NOTE — Progress Notes (Signed)
Discussed discharge summary with patient and family member. Reviewed all medications with patient. Patient received eye drops and supplies for post eye care. Patient ready for discharge.

## 2017-07-02 NOTE — Op Note (Signed)
NAME:  Preston Bell, Preston Bell                    ACCOUNT NO.:  MEDICAL RECORD NO.:  [id number]  LOCATION:                                 FACILITY:  PHYSICIAN:  John D. Zigmund Daniel, M.D.      DATE OF BIRTH:  DATE OF PROCEDURE:  07/01/2017 DATE OF DISCHARGE:                              OPERATIVE REPORT   ADMISSION DIAGNOSIS:  Vitreous hemorrhage, posterior vitreous detachment in the right eye.  PROCEDURES:  Pars plana vitrectomy with retinal photocoagulation and gas- fluid exchange.  SURGEON:  Chrystie Nose. Zigmund Daniel, M.D.  ASSISTANT:  Deatra Ina.  ANESTHESIA:  General.  DETAILS:  Usual prep and drape, 27-gauge trocars were placed at 8, 10, and 2 o'clock.  Infusion at 8 o'clock.  Provisc placed on the corneal surface.  The light pipe and the cutter were placed at 10 and 2 o'clock respectively.  Pars plana vitrectomy was begun just behind the pseudophakias.  Cranberry colored blood was seen in the vitreous cavity. There were moderate number of clots throughout the vitreous cavity. These were carefully removed under low suction and rapid cutting.  There was a concentration of clots at 6 o'clock, and scleral depression was used to gain access to this area.  The blood was trimmed down to the retinal surface.  Some surface retinal hemorrhage was seen at 6 o'clock as well.  This was carefully removed under low suction and rapid cutting.  An inspection was taken of the entire retina and the entire periphery.  No retinal breaks were seen.  No neovascularization or vascular issues were found.  The vitrectomy was carried into the far periphery with the super wide field BIOM viewing lens.  Once all vitreous and vitreous hemorrhage was removed, a 30% gas-fluid exchange was carried out.  The endolaser was positioned in the eye, 1568 burns were placed around the retinal periphery in 2 concentric rows.  The power was 300 mW, 1000 microns each, and 0.1 seconds each.  The retina was inspected again looking  for breaks and vascular problems.  None were found.  The instruments were removed from the eye.  The trocars were removed from the eye.  The wounds were tested and found to be secure. Polymyxin and ceftazidime were rinsed around the globe for antibiotic coverage.  Decadron 10 mg was injected into the lower subconjunctival space.  Marcaine was injected around the globe for postop pain. Polysporin ophthalmic ointment, a patch, and shield were placed.  The patient was awakened and taken to recovery in satisfactory condition. Closing pressure 10 with a [place] tonometer.  COMPLICATIONS:  None.  DURATION:  One hour.     Chrystie Nose. Zigmund Daniel, M.D.     JDM/MEDQ  D:  07/01/2017  T:  07/01/2017  Job:  [id number]

## 2017-07-02 NOTE — Progress Notes (Signed)
07/02/2017, 6:17 AM  Mental Status:  Awake, Alert, Oriented  Anterior segment: Cornea  Clear    Anterior Chamber Clear    Lens:   IOL  Intra Ocular Pressure 17 mmHg with Tonopen  Vitreous: Clear 25%gas bubble   Retina:  Attached Good laser reaction   Impression: Excellent result Retina attached   Final Diagnosis: Principal Problem:   Vitreous hemorrhage, right eye (HCC) Active Problems:   Vitreous hemorrhage, right (HCC)   Plan: start post operative eye drops.  Discharge to home.  Give post operative instructions  Hayden Pedro 07/02/2017, 6:17 AM

## 2017-07-07 ENCOUNTER — Other Ambulatory Visit: Payer: Self-pay

## 2017-07-07 ENCOUNTER — Telehealth: Payer: Self-pay | Admitting: Cardiovascular Disease

## 2017-07-07 DIAGNOSIS — I739 Peripheral vascular disease, unspecified: Principal | ICD-10-CM

## 2017-07-07 DIAGNOSIS — I779 Disorder of arteries and arterioles, unspecified: Secondary | ICD-10-CM

## 2017-07-07 NOTE — Telephone Encounter (Signed)
[place] Hampshire, MD  Carvel Getting, NP; Georgiana Shore, RN        Ok Thanks.   Preston Bell,  This patient needs a follow up carotid doppler for bilateral carotid disease.    Order placed. Routed to scheduling.

## 2017-07-08 ENCOUNTER — Encounter (INDEPENDENT_AMBULATORY_CARE_PROVIDER_SITE_OTHER): Payer: Medicare Other | Admitting: Ophthalmology

## 2017-07-08 DIAGNOSIS — I10 Essential (primary) hypertension: Secondary | ICD-10-CM

## 2017-07-08 DIAGNOSIS — H35031 Hypertensive retinopathy, right eye: Secondary | ICD-10-CM

## 2017-07-25 ENCOUNTER — Ambulatory Visit (INDEPENDENT_AMBULATORY_CARE_PROVIDER_SITE_OTHER): Payer: Medicare Other

## 2017-07-25 DIAGNOSIS — I739 Peripheral vascular disease, unspecified: Principal | ICD-10-CM

## 2017-07-25 DIAGNOSIS — I779 Disorder of arteries and arterioles, unspecified: Secondary | ICD-10-CM | POA: Diagnosis not present

## 2017-07-25 LAB — VAS US CAROTID
LCCAPDIAS: 24 cm/s
LCCAPSYS: 129 cm/s
LEFT VERTEBRAL DIAS: 18 cm/s
LICAPDIAS: -56 cm/s
LICAPSYS: -230 cm/s
Left CCA dist dias: -40 cm/s
Left CCA dist sys: -230 cm/s
Left ICA dist dias: -24 cm/s
Left ICA dist sys: -94 cm/s
RCCAPSYS: -100 cm/s
RIGHT VERTEBRAL DIAS: -19 cm/s
Right CCA prox dias: -17 cm/s
Right cca dist sys: -130 cm/s

## 2017-07-28 ENCOUNTER — Encounter (INDEPENDENT_AMBULATORY_CARE_PROVIDER_SITE_OTHER): Payer: Medicare Other | Admitting: Ophthalmology

## 2017-07-28 DIAGNOSIS — H35033 Hypertensive retinopathy, bilateral: Secondary | ICD-10-CM

## 2017-07-28 DIAGNOSIS — I10 Essential (primary) hypertension: Secondary | ICD-10-CM

## 2017-07-31 ENCOUNTER — Other Ambulatory Visit: Payer: Self-pay | Admitting: *Deleted

## 2017-07-31 DIAGNOSIS — I779 Disorder of arteries and arterioles, unspecified: Secondary | ICD-10-CM

## 2017-07-31 DIAGNOSIS — I739 Peripheral vascular disease, unspecified: Principal | ICD-10-CM

## 2017-08-11 ENCOUNTER — Other Ambulatory Visit: Payer: Self-pay | Admitting: Cardiovascular Disease

## 2017-09-23 DIAGNOSIS — Z961 Presence of intraocular lens: Secondary | ICD-10-CM | POA: Diagnosis not present

## 2017-09-23 DIAGNOSIS — H25012 Cortical age-related cataract, left eye: Secondary | ICD-10-CM | POA: Diagnosis not present

## 2017-09-23 DIAGNOSIS — H2512 Age-related nuclear cataract, left eye: Secondary | ICD-10-CM | POA: Diagnosis not present

## 2017-09-23 DIAGNOSIS — H2589 Other age-related cataract: Secondary | ICD-10-CM | POA: Diagnosis not present

## 2017-09-23 DIAGNOSIS — H25042 Posterior subcapsular polar age-related cataract, left eye: Secondary | ICD-10-CM | POA: Diagnosis not present

## 2017-09-24 ENCOUNTER — Telehealth: Payer: Self-pay | Admitting: Cardiovascular Disease

## 2017-09-24 NOTE — Telephone Encounter (Signed)
Low risk. No need to hold any medication.

## 2017-09-24 NOTE — Telephone Encounter (Signed)
° °  [facility] Pre-operative Risk Assessment    Request for surgical clearance:  1. What type of surgery is being performed? Cataract Extraction Left eye   2. When is this surgery scheduled? 10/20/17   3. What type of clearance is required (medical clearance vs. Pharmacy clearance to hold med vs. Both)? Medical clearance   4. Are there any medications that need to be held prior to surgery and how long?  5. Practice name and name of physician performing surgery? [facility]   6. What is your office phone and fax number? Phone [phone] Fax [phone]   7. Anesthesia type (None, local, MAC, general) ?    Placed in [place]

## 2017-09-24 NOTE — Telephone Encounter (Signed)
To Dr. Arida to review.   

## 2017-09-25 NOTE — Telephone Encounter (Signed)
Copy of phone encounter faxed to [place] and [place] at [phone]. Confirmation received.

## 2017-10-06 ENCOUNTER — Encounter (INDEPENDENT_AMBULATORY_CARE_PROVIDER_SITE_OTHER): Payer: Medicare Other | Admitting: Ophthalmology

## 2017-10-14 ENCOUNTER — Other Ambulatory Visit: Payer: Self-pay

## 2017-10-14 MED ORDER — METOPROLOL TARTRATE 50 MG PO TABS: 50.0000 mg | ORAL_TABLET | Freq: Two times a day (BID) | ORAL | 0 refills | Status: AC

## 2017-10-14 MED ORDER — LISINOPRIL 20 MG PO TABS: 20.0000 mg | ORAL_TABLET | Freq: Every day | ORAL | 0 refills | Status: AC

## 2017-10-14 MED ORDER — ATORVASTATIN CALCIUM 20 MG PO TABS: 20.0000 mg | ORAL_TABLET | ORAL | 0 refills | Status: AC

## 2017-10-14 NOTE — Telephone Encounter (Signed)
Requested Prescriptions   Pending Prescriptions Disp Refills  . metoprolol tartrate (LOPRESSOR) 50 MG tablet 60 tablet 1    Sig: Take 1 tablet (50 mg total) by mouth 2 (two) times daily.  Marland Kitchen atorvastatin (LIPITOR) 20 MG tablet 30 tablet 1    Sig: Take 1 tablet (20 mg total) by mouth every morning.  Marland Kitchen lisinopril (PRINIVIL,ZESTRIL) 20 MG tablet 90 tablet 1    Sig: Take 1 tablet (20 mg total) by mouth daily.   90 day sent to [place]

## 2017-11-03 DIAGNOSIS — H2512 Age-related nuclear cataract, left eye: Secondary | ICD-10-CM | POA: Diagnosis not present

## 2017-12-11 DIAGNOSIS — I10 Essential (primary) hypertension: Secondary | ICD-10-CM | POA: Diagnosis not present

## 2017-12-11 DIAGNOSIS — E7800 Pure hypercholesterolemia, unspecified: Secondary | ICD-10-CM | POA: Diagnosis not present

## 2017-12-11 DIAGNOSIS — I2510 Atherosclerotic heart disease of native coronary artery without angina pectoris: Secondary | ICD-10-CM | POA: Diagnosis not present

## 2017-12-11 DIAGNOSIS — F5104 Psychophysiologic insomnia: Secondary | ICD-10-CM | POA: Diagnosis not present

## 2017-12-11 DIAGNOSIS — R110 Nausea: Secondary | ICD-10-CM | POA: Diagnosis not present

## 2017-12-11 DIAGNOSIS — K2981 Duodenitis with bleeding: Secondary | ICD-10-CM | POA: Diagnosis not present

## 2018-02-11 DIAGNOSIS — F419 Anxiety disorder, unspecified: Secondary | ICD-10-CM | POA: Diagnosis not present

## 2018-02-11 DIAGNOSIS — F5104 Psychophysiologic insomnia: Secondary | ICD-10-CM | POA: Diagnosis not present

## 2018-02-11 DIAGNOSIS — I10 Essential (primary) hypertension: Secondary | ICD-10-CM | POA: Diagnosis not present

## 2018-02-11 DIAGNOSIS — I2510 Atherosclerotic heart disease of native coronary artery without angina pectoris: Secondary | ICD-10-CM | POA: Diagnosis not present

## 2018-02-11 DIAGNOSIS — F410 Panic disorder [episodic paroxysmal anxiety] without agoraphobia: Secondary | ICD-10-CM | POA: Diagnosis not present

## 2018-06-27 ENCOUNTER — Other Ambulatory Visit: Payer: Self-pay

## 2018-06-27 ENCOUNTER — Emergency Department
Admission: EM | Admit: 2018-06-27 | Discharge: 2018-06-27 | Disposition: A | Discharge status: Home / Self Care | Payer: Medicare Other | Attending: Emergency Medicine | Admitting: Emergency Medicine

## 2018-06-27 DIAGNOSIS — Z87891 Personal history of nicotine dependence: Secondary | ICD-10-CM | POA: Diagnosis not present

## 2018-06-27 DIAGNOSIS — E785 Hyperlipidemia, unspecified: Secondary | ICD-10-CM | POA: Diagnosis not present

## 2018-06-27 DIAGNOSIS — B029 Zoster without complications: Secondary | ICD-10-CM | POA: Insufficient documentation

## 2018-06-27 DIAGNOSIS — I10 Essential (primary) hypertension: Secondary | ICD-10-CM | POA: Insufficient documentation

## 2018-06-27 DIAGNOSIS — Z7982 Long term (current) use of aspirin: Secondary | ICD-10-CM | POA: Insufficient documentation

## 2018-06-27 DIAGNOSIS — I2510 Atherosclerotic heart disease of native coronary artery without angina pectoris: Secondary | ICD-10-CM | POA: Insufficient documentation

## 2018-06-27 DIAGNOSIS — I252 Old myocardial infarction: Secondary | ICD-10-CM | POA: Diagnosis not present

## 2018-06-27 DIAGNOSIS — E039 Hypothyroidism, unspecified: Secondary | ICD-10-CM | POA: Insufficient documentation

## 2018-06-27 DIAGNOSIS — Z79899 Other long term (current) drug therapy: Secondary | ICD-10-CM | POA: Diagnosis not present

## 2018-06-27 DIAGNOSIS — Z951 Presence of aortocoronary bypass graft: Secondary | ICD-10-CM | POA: Diagnosis not present

## 2018-06-27 DIAGNOSIS — R21 Rash and other nonspecific skin eruption: Secondary | ICD-10-CM | POA: Diagnosis present

## 2018-06-27 MED ORDER — PREDNISONE 10 MG (21) PO TBPK: ORAL_TABLET | ORAL | 0 refills | Status: DC

## 2018-06-27 MED ORDER — OXYCODONE-ACETAMINOPHEN 5-325 MG PO TABS
1.0000 | ORAL_TABLET | Freq: Once | ORAL | Status: AC
  Administered 2018-06-27: 1 via ORAL
  Filled 2018-06-27: qty 1

## 2018-06-27 MED ORDER — HYDROXYZINE HCL 25 MG PO TABS: 25.0000 mg | ORAL_TABLET | Freq: Three times a day (TID) | ORAL | 0 refills | Status: DC | PRN

## 2018-06-27 MED ORDER — OXYCODONE-ACETAMINOPHEN 5-325 MG PO TABS: 1.0000 | ORAL_TABLET | Freq: Four times a day (QID) | ORAL | 0 refills | Status: DC | PRN

## 2018-06-27 MED ORDER — FAMCICLOVIR 500 MG PO TABS: 500.0000 mg | ORAL_TABLET | Freq: Three times a day (TID) | ORAL | 0 refills | Status: DC

## 2018-06-27 NOTE — ED Notes (Signed)
See triage note  Presents with right for the past couple of days  States rash is painful  And moving into right leg

## 2018-06-27 NOTE — Discharge Instructions (Addendum)
Follow-up with your regular doctor.  Please call for an appointment.  Take the medications as prescribed.

## 2018-06-27 NOTE — ED Provider Notes (Signed)
[facility] Emergency Department Provider Note  ____________________________________________   First MD Initiated Contact with Patient 06/27/18 1145     (approximate)  I have reviewed the triage vital signs and the nursing notes.   HISTORY  Chief Complaint Rash    HPI Preston Bell is a 77 y.o. male presents emergency department complaining of a painful rash that starts at the lower part of his spine and radiates down into his leg.  Symptoms for 2 days.  He denies any fever or chills.  No other complaints at this time.  He would like a refill on his Atarax 25 mg as he has been unable to see his doctor in the past few weeks.    Past Medical History:  Diagnosis Date  . (HFpEF) heart failure with preserved ejection fraction ([place])    a. 09/2015 Echo: EF 55-60%, no rwma, Gr1 DD, triv AI, mildly dil LA, mild TR, PASP nl.  . Alcohol abuse   . Anemia   . Anxiety   . CAD, multiple vessel 10/10/2015   a. 09/2015 NSTEMI/Cath: LM 90, LAD 90ost, D2 90, LCX 80ost, RCA 48m-->CABG x 3 (LIMA->LAD, VG->OM, VG->RCA).  . Cancer ([employer])    Lung and Colon  . Gallstone pancreatitis   . GERD (gastroesophageal reflux disease)   . History of colectomy   . HLD (hyperlipidemia)   . Hypercalcemia   . Hypertension   . Hypothyroidism   . NSTEMI (non-ST elevated myocardial infarction) ([place]) 09/2015  . Panic disorder   . Pleural effusion    Right Lung  . Pulmonary nodule    a. 02/2016 CT Chest: Partially calcified right basilar pleural plaque and bibasilar subpleural fibrosis, unchanged. Likely sequela prior asbestos exposure. No new pulm masses or nodules.  . Sepsis ([place])   . SVT (supraventricular tachycardia) [facility])     Patient Active Problem List   Diagnosis Date Noted  . Vitreous hemorrhage, right ([place]) 07/01/2017  . Vitreous hemorrhage, right eye ([place]) 06/25/2017  . Multiple duodenal ulcers   . Bile duct stone   . UTI (lower urinary tract infection)   . Acute renal  failure (ARF) ([place]) 12/03/2015  . Right flank pain   . RUQ pain   . 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 Essential hypertension 10/12/2015  . Hyperlipidemia 10/12/2015  . Anemia, unspecified 10/12/2015  . SVT (supraventricular tachycardia) ([place]) 10/12/2015  . Sinus tachycardia 10/12/2015  . Coronary artery disease involving native coronary artery of native heart without angina pectoris   . Pancreatitis due to common bile duct stone   . Acute diastolic heart failure ([place]) - EDP severely elevated @ Cath post NSTEMI 10/11/2015  . Left main coronary artery disease: 90% LM 10/10/2015  . Right lower lobe pneumonia ([place]) 10/10/2015  . Bacteremia due to Klebsiella pneumoniae 10/10/2015  . 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 Pleural effusion on right - s/p thoracentesis   . Vomiting and diarrhea   . Elevated troponin I level 10/06/2015  . NSTEMI (non-ST elevated myocardial infarction) ([place])   . Cholecystitis 10/05/2015  . Sepsis ([place]) 10/05/2015  . Abdominal pain   . Calcium blood increased 06/16/2015  . Weight loss 06/14/2015  . Lung mass 02/22/2014  . Hernia, incisional 02/22/2014  . H/O malignant neoplasm of colon 02/22/2014  . H/O alcohol abuse 02/22/2014  . Former heavy tobacco smoker 02/22/2014  . Anxiety 02/22/2014  . Acid reflux 06/16/2002  . Episodic  paroxysmal anxiety disorder 04/03/2001    Past Surgical History:  Procedure Laterality Date  . AIR/FLUID EXCHANGE Right 07/01/2017   Procedure: AIR/FLUID EXCHANGE;  Surgeon: Hayden Pedro, MD;  Location: [place];  Service: Ophthalmology;  Laterality: Right;  . CARDIAC CATHETERIZATION N/A 10/09/2015   Procedure: Left Heart Cath and Coronary Angiography;  Surgeon: Wellington Hampshire, MD;  Location: [employer] CV LAB;  Service: Cardiovascular;  Laterality: N/A;  . CHOLECYSTECTOMY N/A 03/27/2016   Procedure: LAPAROSCOPIC CHOLECYSTECTOMY WITH INTRAOPERATIVE CHOLANGIOGRAM;  Surgeon: Clayburn Pert, MD;  Location: [facility] ORS;  Service: General;  Laterality: N/A;  . COLON SURGERY  2012   Colectomy ([place])  . COLOSTOMY REVERSAL  2013  . CORONARY ARTERY BYPASS GRAFT N/A 10/13/2015   Procedure: CORONARY ARTERY BYPASS GRAFTING (CABG), ON PUMP, TIMES THREE, USING LEFT INTERNAL MAMMARY ARTERY, RIGHT GREATER SAPHENOUS VEIN HARVESTED ENDOSCOPICALLY;  Surgeon: Ivin Poot, MD;  Location: [place];  Service: Open Heart Surgery;  Laterality: N/A;  LIMA-LAD; SVG-OM; SVG-RCA  . CORONARY ARTERY BYPASS GRAFT     x 3 Vessels  . ERCP N/A 06/08/2016   Procedure: ENDOSCOPIC RETROGRADE CHOLANGIOPANCREATOGRAPHY (ERCP);  Surgeon: Gatha Mayer, MD;  Location: [facility] ENDOSCOPY;  Service: Endoscopy;  Laterality: N/A;  . KNEE ARTHROSCOPY     Right-2011 left-2012  . PARS PLANA VITRECTOMY 27 GAUGE Right 07/01/2017   Procedure: PARS PLANA VITRECTOMY 27 GAUGE;  Surgeon: Hayden Pedro, MD;  Location: [place];  Service: Ophthalmology;  Laterality: Right;  . PHOTOCOAGULATION Right 07/01/2017   Procedure: PHOTOCOAGULATION;  Surgeon: Hayden Pedro, MD;  Location: [place];  Service: Ophthalmology;  Laterality: Right;  . TEE WITHOUT CARDIOVERSION N/A 10/13/2015   Procedure: TRANSESOPHAGEAL ECHOCARDIOGRAM (TEE);  Surgeon: Ivin Poot, MD;  Location: [place];  Service: Open Heart Surgery;  Laterality: N/A;  . THORACENTESIS      Prior to Admission medications   Medication Sig Start Date End Date Taking? Authorizing Provider  aspirin EC 81 MG tablet Take 1 tablet (81 mg total) by mouth daily. 04/29/17   Theora Gianotti, NP  atorvastatin (LIPITOR) 20 MG tablet Take 1 tablet (20 mg total) by mouth every morning. 10/14/17 04/25/19  Wellington Hampshire, MD  famciclovir (FAMVIR) 500 MG tablet Take 1 tablet (500 mg total) by mouth 3 (three) times daily. 06/27/18   Llewellyn Schoenberger, Linden Dolin, PA-C  gatifloxacin (ZYMAXID) 0.5 % SOLN Place 1 drop 4 (four) times daily into the right eye. 07/02/17   Hayden Pedro, MD  hydrOXYzine  (ATARAX/VISTARIL) 25 MG tablet Take 1 tablet (25 mg total) by mouth 3 (three) times daily as needed. 06/27/18   Eula Mazzola, Linden Dolin, PA-C  lisinopril (PRINIVIL,ZESTRIL) 20 MG tablet Take 1 tablet (20 mg total) by mouth daily. 10/14/17   Wellington Hampshire, MD  metoprolol tartrate (LOPRESSOR) 50 MG tablet Take 1 tablet (50 mg total) by mouth 2 (two) times daily. 10/14/17   Wellington Hampshire, MD  mirtazapine (REMERON) 15 MG tablet Take 15 mg by mouth at bedtime. 06/14/15 04/29/17  [provider]  nitroGLYCERIN (NITROSTAT) 0.4 MG SL tablet Place 1 tablet (0.4 mg total) under the tongue every 5 (five) minutes as needed for chest pain. MAXIMUM OF 3 DOSES. 04/29/17 07/28/17  Theora Gianotti, NP  oxyCODONE-acetaminophen (PERCOCET/ROXICET) 5-325 MG tablet Take 1-2 tablets by mouth every 6 (six) hours as needed for severe pain. 06/27/18   Vaani Morren, Linden Dolin, PA-C  pantoprazole (PROTONIX) 40 MG tablet Take 1 tablet (40 mg total)  by mouth 2 (two) times daily. 06/09/16   Regalado, Belkys A, MD  prednisoLONE acetate (PRED FORTE) 1 % ophthalmic suspension Place 1 drop 4 (four) times daily into the right eye. 07/02/17   Hayden Pedro, MD  predniSONE (STERAPRED UNI-PAK 21 TAB) 10 MG (21) TBPK tablet Take 6 pills on day one then decrease by 1 pill each day 06/27/18   Versie Starks, PA-C    Allergies Patient has no known allergies.  Family History  Problem Relation Age of Onset  . Cervical cancer Mother   . Lung cancer Father   . Lung cancer Brother   . Hypertension Brother   . Hypertension Son     Social History Social History   Tobacco Use  . Smoking status: Former Smoker    Packs/day: 1.00    Years: 30.00    Pack years: 30.00    Last attempt to quit: 10/06/1983    Years since quitting: 34.7  . Smokeless tobacco: Never Used  Substance Use Topics  . 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 Drug use: No    Types:  Marijuana    Review of Systems  Constitutional: No fever/chills Eyes: No visual changes. ENT: No sore throat. Respiratory: Denies cough Genitourinary: Negative for dysuria. Musculoskeletal: Negative for back pain. Skin: Positive for rash.    ____________________________________________   PHYSICAL EXAM:  VITAL SIGNS: ED Triage Vitals  Enc Vitals Group     BP 06/27/18 1125 124/62     Pulse Rate 06/27/18 1125 (!) 129     Resp 06/27/18 1125 18     Temp 06/27/18 1126 97.6 F (36.4 C)     Temp Source 06/27/18 1125 Oral     SpO2 06/27/18 1125 99 %     Weight 06/27/18 1124 160 lb (72.6 kg)     Height 06/27/18 1124 6\' 2"  (1.88 m)     Head Circumference --      Peak Flow --      Pain Score 06/27/18 1123 10     Pain Loc --      Pain Edu? --      Excl. in [place]? --     Constitutional: Alert and oriented. Well appearing and in no acute distress. Eyes: Conjunctivae are normal.  Head: Atraumatic. Nose: No congestion/rhinnorhea. Mouth/Throat: Mucous membranes are moist.   Neck:  supple no lymphadenopathy noted Cardiovascular: Normal rate, regular rhythm. Heart sounds are normal Respiratory: Normal respiratory effort.  No retractions, lungs c t a  GU: deferred Musculoskeletal: FROM all extremities, warm and well perfused Neurologic:  Normal speech and language.  Skin:  Skin is warm, dry and intact.  Positive for rash that starts at the right side of the lumbar spine and descends down along the dermatome to the right leg.  It is a erythematous based with vesicles type rash.  No active drainage is noted at this time. Psychiatric: Mood and affect are normal. Speech and behavior are normal.  ____________________________________________   LABS (all labs ordered are listed, but only abnormal results are displayed)  Labs Reviewed - No data to  display ____________________________________________   ____________________________________________  RADIOLOGY    ____________________________________________   PROCEDURES  Procedure(s) performed: No  Procedures    ____________________________________________   INITIAL IMPRESSION / ASSESSMENT AND PLAN / ED COURSE  Pertinent labs & imaging results that were available during my care of the patient  were reviewed by me and considered in my medical decision making (see chart for details).   Patient is 77 year old male presents emergency department with a painful rash.  Physical exam patient has a shingles type rash radiating from the lower spine on the right side down the right leg.  Explained the findings to the patient and his family member.  He was given a Percocet while here in the emergency department.  He was given a prescription for Famvir, prednisone, Percocet, and a refill on his Atarax which helps him with his "nerves".  He was instructed to follow-up with his regular doctor next week.  Return emergency department worsening.  They both state they understand and will comply.  He was discharged in stable condition.     As part of my medical decision making, I reviewed the following data within the [place] History obtained from family, Nursing notes reviewed and incorporated, Old chart reviewed, Notes from prior ED visits and [place] Controlled Substance Database  ____________________________________________   FINAL CLINICAL IMPRESSION(S) / ED DIAGNOSES  Final diagnoses:  Herpes zoster without complication      NEW MEDICATIONS STARTED DURING THIS VISIT:  New Prescriptions   FAMCICLOVIR (FAMVIR) 500 MG TABLET    Take 1 tablet (500 mg total) by mouth 3 (three) times daily.   HYDROXYZINE (ATARAX/VISTARIL) 25 MG TABLET    Take 1 tablet (25 mg total) by mouth 3 (three) times daily as needed.   OXYCODONE-ACETAMINOPHEN (PERCOCET/ROXICET) 5-325 MG TABLET     Take 1-2 tablets by mouth every 6 (six) hours as needed for severe pain.   PREDNISONE (STERAPRED UNI-PAK 21 TAB) 10 MG (21) TBPK TABLET    Take 6 pills on day one then decrease by 1 pill each day     Note:  This document was prepared using Dragon voice recognition software and may include unintentional dictation errors.    Versie Starks, PA-C 06/27/18 1215    Schaevitz, Randall An, MD 06/27/18 1248

## 2018-06-27 NOTE — ED Triage Notes (Signed)
Rash to R buttock going down R leg x 2 days. Painful to pt. A&O. Ambulatory.

## 2018-06-27 NOTE — ED Triage Notes (Signed)
First Nurse Note:  C/O right side painful rash x 2 days.  AAOx3.  Skin warm and dry. NAD

## 2018-08-03 NOTE — Progress Notes (Signed)
Cardiology Office Note Date:  08/05/2018  Patient ID:  CORDAI RODRIGUE, DOB May 03, 1941, MRN [id number] PCP:  System, Pcp Not In  Cardiologist:  Dr. Fletcher Anon, MD    Chief Complaint: Follow up  History of Present Illness: Preston Bell is a 77 y.o. male with history of CAD s/p 3-vessel CABG (LIMA to LAD, SVG to OM, SVG to RCA) in 02/3418 complicated by pleural effusion requiring thoracentesis, HFpEF, HTN, HLD, anemia, remote tobacco and alcohol abuse, and GERD who presents for follow up of his CAD and HFpEF.   He was admitted to the hospital in 09/2015 with a NSTEMI. Echo showed an EF of 55-60%, mild concentric LVH, no RWMA, Gr1DD, trivial AI, mildly dilated LA, mild TR, PASP normal. LHC in 09/2015 showed severe distal left main stenosis of 90%, ostial LAD 70% stenosis, distal LAD 40% stenosis, ostial to proximal LCx 80% stenosis, mid RCA 95% stenosis, LVSF normal. In this setting, he was transferred to [facility] and underwent successful 3-vessel CABG. He was last seen in the office in 04/2017 for follow up and was doing well. No ischemic evaluations since his bypass surgery.   Labs: 06/2017 - potassium 4.3, SCr 1.03, HGB 10.7 09/2015 - LDL 46  He comes in accompanied by his daughter today.  He is doing well from a cardiac perspective.  No chest pain, shortness of breath, palpitations, dizziness, presyncope, or syncope.  No lower extremity swelling, abdominal distention, orthopnea, PND, or early satiety.  He was recently diagnosed with shingles affecting the right flank and right leg and has since gotten over this.  In this setting, he did notice some increased fatigue and decreased appetite which are improving.  His daughter reports significant anxiety which leads to upset stomach and decreased appetite with weight loss.  He is out of his Zoloft and PRN hydroxyzine.  He has an appointment scheduled to see his PCP next month and they request a refill of his Zoloft and PRN hydroxyzine at this time.   Since he was last seen, he has not had any falls.  No BRBPR or melena.  He does not check his blood pressure at home.  Outside of refilling the above 2 medications he does not have any issues or concerns at this time.  Past Medical History:  Diagnosis Date  . (HFpEF) heart failure with preserved ejection fraction ([place])    a. 09/2015 Echo: EF 55-60%, no rwma, Gr1 DD, triv AI, mildly dil LA, mild TR, PASP nl.  . Alcohol abuse   . Anemia   . Anxiety   . CAD, multiple vessel 10/10/2015   a. 09/2015 NSTEMI/Cath: LM 90, LAD 90ost, D2 90, LCX 80ost, RCA 21m-->CABG x 3 (LIMA->LAD, VG->OM, VG->RCA).  . Cancer ([place])    Lung and Colon  . Gallstone pancreatitis   . GERD (gastroesophageal reflux disease)   . History of colectomy   . HLD (hyperlipidemia)   . Hypercalcemia   . Hypertension   . Hypothyroidism   . NSTEMI (non-ST elevated myocardial infarction) ([place]) 09/2015  . Panic disorder   . Pleural effusion    Right Lung  . Pulmonary nodule    a. 02/2016 CT Chest: Partially calcified right basilar pleural plaque and bibasilar subpleural fibrosis, unchanged. Likely sequela prior asbestos exposure. No new pulm masses or nodules.  . Sepsis ([place])   . SVT (supraventricular tachycardia) ([facility])     Past Surgical History:  Procedure Laterality Date  . AIR/FLUID EXCHANGE Right 07/01/2017   Procedure: AIR/FLUID  EXCHANGE;  Surgeon: Hayden Pedro, MD;  Location: [facility];  Service: Ophthalmology;  Laterality: Right;  . CARDIAC CATHETERIZATION N/A 10/09/2015   Procedure: Left Heart Cath and Coronary Angiography;  Surgeon: Wellington Hampshire, MD;  Location: [facility] CV LAB;  Service: Cardiovascular;  Laterality: N/A;  . CHOLECYSTECTOMY N/A 03/27/2016   Procedure: LAPAROSCOPIC CHOLECYSTECTOMY WITH INTRAOPERATIVE CHOLANGIOGRAM;  Surgeon: Clayburn Pert, MD;  Location: [facility] ORS;  Service: General;  Laterality: N/A;  . COLON SURGERY  2012   Colectomy ([place])  . COLOSTOMY REVERSAL  2013  . CORONARY  ARTERY BYPASS GRAFT N/A 10/13/2015   Procedure: CORONARY ARTERY BYPASS GRAFTING (CABG), ON PUMP, TIMES THREE, USING LEFT INTERNAL MAMMARY ARTERY, RIGHT GREATER SAPHENOUS VEIN HARVESTED ENDOSCOPICALLY;  Surgeon: Ivin Poot, MD;  Location: [place];  Service: Open Heart Surgery;  Laterality: N/A;  LIMA-LAD; SVG-OM; SVG-RCA  . CORONARY ARTERY BYPASS GRAFT     x 3 Vessels  . ERCP N/A 06/08/2016   Procedure: ENDOSCOPIC RETROGRADE CHOLANGIOPANCREATOGRAPHY (ERCP);  Surgeon: Gatha Mayer, MD;  Location: [facility] ENDOSCOPY;  Service: Endoscopy;  Laterality: N/A;  . KNEE ARTHROSCOPY     Right-2011 left-2012  . PARS PLANA VITRECTOMY 27 GAUGE Right 07/01/2017   Procedure: PARS PLANA VITRECTOMY 27 GAUGE;  Surgeon: Hayden Pedro, MD;  Location: [place];  Service: Ophthalmology;  Laterality: Right;  . PHOTOCOAGULATION Right 07/01/2017   Procedure: PHOTOCOAGULATION;  Surgeon: Hayden Pedro, MD;  Location: [place];  Service: Ophthalmology;  Laterality: Right;  . TEE WITHOUT CARDIOVERSION N/A 10/13/2015   Procedure: TRANSESOPHAGEAL ECHOCARDIOGRAM (TEE);  Surgeon: Ivin Poot, MD;  Location: [facility];  Service: Open Heart Surgery;  Laterality: N/A;  . THORACENTESIS      Current Meds  Medication Sig  . aspirin EC 81 MG tablet Take 1 tablet (81 mg total) by mouth daily.  Marland Kitchen atorvastatin (LIPITOR) 20 MG tablet Take 1 tablet (20 mg total) by mouth every morning.  . hydrOXYzine (ATARAX/VISTARIL) 25 MG tablet Take 1 tablet (25 mg total) by mouth 3 (three) times daily as needed.  Marland Kitchen lisinopril (PRINIVIL,ZESTRIL) 20 MG tablet Take 1 tablet (20 mg total) by mouth daily.  . metoprolol tartrate (LOPRESSOR) 50 MG tablet Take 1 tablet (50 mg total) by mouth 2 (two) times daily.  . nitroGLYCERIN (NITROSTAT) 0.4 MG SL tablet Place 1 tablet (0.4 mg total) under the tongue every 5 (five) minutes as needed for chest pain. MAXIMUM OF 3 DOSES.  Marland Kitchen pantoprazole (PROTONIX) 40 MG tablet Take 1 tablet (40 mg total) by mouth 2 (two) times  daily.  . sertraline (ZOLOFT) 100 MG tablet Take 100 mg by mouth daily.     Allergies:   Patient has no known allergies.   Social History:  The patient  reports that he quit smoking about 34 years ago. He has a 30.00 pack-year smoking history. He has never used smokeless tobacco. He reports that he does not drink alcohol or use drugs.   Family History:  The patient's family history includes Cervical cancer in his mother; Hypertension in his brother and son; Lung cancer in his brother and father.  ROS:   Review of Systems  Constitutional: Positive for malaise/fatigue. Negative for chills, diaphoresis, fever and weight loss.  HENT: Negative for congestion.   Eyes: Negative for discharge and redness.  Respiratory: Negative for cough, hemoptysis, sputum production, shortness of breath and wheezing.   Cardiovascular: Negative for chest pain, palpitations, orthopnea, claudication, leg swelling and PND.  Gastrointestinal: Negative for  abdominal pain, blood in stool, heartburn, melena, nausea and vomiting.  Genitourinary: Negative for hematuria.  Musculoskeletal: Negative for falls and myalgias.  Skin: Negative for rash.  Neurological: Negative for dizziness, tingling, tremors, sensory change, speech change, focal weakness, loss of consciousness and weakness.  Endo/Heme/Allergies: Does not bruise/bleed easily.  Psychiatric/Behavioral: Negative for substance abuse. The patient is nervous/anxious.   All other systems reviewed and are negative.    PHYSICAL EXAM:  VS:  BP (!) 110/52 (BP Location: Left Arm, Patient Position: Sitting, Cuff Size: Normal)   Pulse 75   Ht 6\' 2"  (1.88 m)   Wt 155 lb 8 oz (70.5 kg)   BMI 19.97 kg/m  BMI: Body mass index is 19.97 kg/m.  Physical Exam  Constitutional: He is oriented to person, place, and time. He appears well-developed and well-nourished.  HENT:  Head: Normocephalic and atraumatic.  Eyes: Right eye exhibits no discharge. Left eye exhibits no  discharge.  Neck: Normal range of motion. No JVD present.  Cardiovascular: Normal rate, regular rhythm, S1 normal, S2 normal and normal heart sounds. Exam reveals no distant heart sounds, no friction rub, no midsystolic click and no opening snap.  No murmur heard. Pulses:      Posterior tibial pulses are 2+ on the right side, and 2+ on the left side.  Pulmonary/Chest: Effort normal and breath sounds normal. No respiratory distress. He has no decreased breath sounds. He has no wheezes. He has no rales. He exhibits no tenderness.  Abdominal: Soft. He exhibits no distension. There is no tenderness.  Musculoskeletal: He exhibits no edema.  Neurological: He is alert and oriented to person, place, and time.  Skin: Skin is warm and dry. No cyanosis. Nails show no clubbing.  Psychiatric: He has a normal mood and affect. His speech is normal and behavior is normal. Judgment and thought content normal.     EKG:  Was ordered and interpreted by me today. Shows NSR, 75 bpm, nonspecific inferior st/t changes  Recent Labs: No results found for requested labs within last 8760 hours.  No results found for requested labs within last 8760 hours.   CrCl cannot be calculated (Patient's most recent lab result is older than the maximum 21 days allowed.).   Wt Readings from Last 3 Encounters:  08/05/18 155 lb 8 oz (70.5 kg)  06/27/18 160 lb (72.6 kg)  07/01/17 169 lb (76.7 kg)     Other studies reviewed: Additional studies/records reviewed today include: summarized above  ASSESSMENT AND PLAN:  1. CAD s/p CABG: He is doing well without any symptoms concerning for angina.  He remains active without experiencing any chest pain or dyspnea.  Continue aspirin, Lipitor, metoprolol, and lisinopril.  Continue aggressive risk factor modification and secondary prevention.  No plans for ischemic evaluation at this time.  2. HFpEF: He appears euvolemic and is not requiring daily diuretic therapy.  CHF  education.  3. HTN: Blood pressure is well controlled today.  Remains on lisinopril and metoprolol as above.  4. HLD: Most recent LDL was 46 from 09/2015.  We will check a lipid panel, direct LDL, and CMP today to evaluate his renal function, liver function, and lipids.  Goal LDL less than 70.  5. Anxiety: I have authorized a one-time refill of his Zoloft and hydroxyzine.  Further refills will need to come from his PCP.  He has an appointment with them next month.  Disposition: F/u with Dr. Fletcher Anon or an APP in 6 months, sooner if needed  Current medicines are reviewed at length with the patient today.  The patient did not have any concerns regarding medicines.  Signed, Christell Faith, PA-C 08/05/2018 1:25 PM     [address] [place], [place] [address] [phone]

## 2018-08-05 ENCOUNTER — Encounter: Payer: Self-pay | Admitting: Physician Assistant

## 2018-08-05 ENCOUNTER — Ambulatory Visit (INDEPENDENT_AMBULATORY_CARE_PROVIDER_SITE_OTHER): Payer: Medicare Other | Admitting: Physician Assistant

## 2018-08-05 VITALS — BP 110/52 | HR 75 | Ht 74.0 in | Wt 155.5 lb

## 2018-08-05 DIAGNOSIS — Z951 Presence of aortocoronary bypass graft: Secondary | ICD-10-CM

## 2018-08-05 DIAGNOSIS — I10 Essential (primary) hypertension: Secondary | ICD-10-CM | POA: Diagnosis not present

## 2018-08-05 DIAGNOSIS — I5032 Chronic diastolic (congestive) heart failure: Secondary | ICD-10-CM

## 2018-08-05 DIAGNOSIS — F419 Anxiety disorder, unspecified: Secondary | ICD-10-CM

## 2018-08-05 DIAGNOSIS — E785 Hyperlipidemia, unspecified: Secondary | ICD-10-CM | POA: Diagnosis not present

## 2018-08-05 DIAGNOSIS — I2510 Atherosclerotic heart disease of native coronary artery without angina pectoris: Secondary | ICD-10-CM | POA: Diagnosis not present

## 2018-08-05 MED ORDER — HYDROXYZINE HCL 25 MG PO TABS: 25.0000 mg | ORAL_TABLET | Freq: Three times a day (TID) | ORAL | 0 refills | Status: AC | PRN

## 2018-08-05 MED ORDER — SERTRALINE HCL 100 MG PO TABS: 100.0000 mg | ORAL_TABLET | Freq: Every day | ORAL | 11 refills | Status: AC

## 2018-08-05 NOTE — Patient Instructions (Signed)
Medication Instructions:  Your physician recommends that you continue on your current medications as directed. Please refer to the Current Medication list given to you today.  If you need a refill on your cardiac medications before your next appointment, please call your pharmacy.   Lab work: Your physician recommends that you return for lab work today (Direct LDL, lipids, CMET)  If you have labs (blood work) drawn today and your tests are completely normal, you will receive your results only by: Marland Kitchen MyChart Message (if you have MyChart) OR . A paper copy in the mail If you have any lab test that is abnormal or we need to change your treatment, we will call you to review the results.  Testing/Procedures: None ordered   Follow-Up: At [facility], you and your health needs are our priority.  As part of our continuing mission to provide you with exceptional heart care, we have created designated Provider Care Teams.  These Care Teams include your primary Cardiologist (physician) and Advanced Practice Providers (APPs -  Physician Assistants and Nurse Practitioners) who all work together to provide you with the care you need, when you need it. You will need a follow up appointment in 6 months.  Please call our office 2 months in advance to schedule this appointment.  You may see Dr. Fletcher Anon or one of the following Advanced Practice Providers on your designated Care Team:   Murray Hodgkins, NP Christell Faith, PA-C . Marrianne Mood, PA-C

## 2018-08-06 LAB — COMPREHENSIVE METABOLIC PANEL
ALK PHOS: 74 IU/L (ref 39–117)
ALT: 8 IU/L (ref 0–44)
AST: 14 IU/L (ref 0–40)
Albumin/Globulin Ratio: 1.5 (ref 1.2–2.2)
Albumin: 4.1 g/dL (ref 3.5–4.8)
BUN/Creatinine Ratio: 12 (ref 10–24)
BUN: 12 mg/dL (ref 8–27)
Bilirubin Total: 0.4 mg/dL (ref 0.0–1.2)
CALCIUM: 9.5 mg/dL (ref 8.6–10.2)
CO2: 26 mmol/L (ref 20–29)
Chloride: 100 mmol/L (ref 96–106)
Creatinine, Ser: 0.99 mg/dL (ref 0.76–1.27)
GFR, EST AFRICAN AMERICAN: 85 mL/min/{1.73_m2} (ref >59)
GFR, EST NON AFRICAN AMERICAN: 74 mL/min/{1.73_m2} (ref >59)
Globulin, Total: 2.7 g/dL (ref 1.5–4.5)
Glucose: 122 mg/dL — ABNORMAL HIGH (ref 65–99)
Potassium: 4 mmol/L (ref 3.5–5.2)
SODIUM: 139 mmol/L (ref 134–144)
Total Protein: 6.8 g/dL (ref 6.0–8.5)

## 2018-08-06 LAB — LIPID PANEL
Chol/HDL Ratio: 2.8 ratio (ref 0.0–5.0)
Cholesterol, Total: 103 mg/dL (ref 100–199)
HDL: 37 mg/dL — ABNORMAL LOW (ref >39)
LDL Calculated: 46 mg/dL (ref 0–99)
Triglycerides: 100 mg/dL (ref 0–149)
VLDL Cholesterol Cal: 20 mg/dL (ref 5–40)

## 2018-08-10 ENCOUNTER — Telehealth: Payer: Self-pay

## 2018-08-10 NOTE — Telephone Encounter (Signed)
Pt not reached, left message to call back.    "Notes recorded by Theora Gianotti, NP on 08/07/2018 at 2:43 PM EST Glucose mildly elevated but electrolytes, kidney function, and liver function within normal limits. LDL cholesterol at goal at 46."

## 2018-08-12 NOTE — Telephone Encounter (Signed)
Spoke with pt on phone and discussed lab results. Pt verbalized understanding and had no further questions.    Advised pt to call for any further questions or concerns

## 2018-08-12 NOTE — Telephone Encounter (Signed)
I left a message for the patient to call back 

## 2018-09-28 DIAGNOSIS — G2581 Restless legs syndrome: Secondary | ICD-10-CM | POA: Diagnosis not present

## 2018-09-28 DIAGNOSIS — F419 Anxiety disorder, unspecified: Secondary | ICD-10-CM | POA: Diagnosis not present

## 2018-09-28 DIAGNOSIS — E7800 Pure hypercholesterolemia, unspecified: Secondary | ICD-10-CM | POA: Diagnosis not present

## 2018-09-28 DIAGNOSIS — I2510 Atherosclerotic heart disease of native coronary artery without angina pectoris: Secondary | ICD-10-CM | POA: Diagnosis not present

## 2018-09-28 DIAGNOSIS — K267 Chronic duodenal ulcer without hemorrhage or perforation: Secondary | ICD-10-CM | POA: Diagnosis not present

## 2018-09-28 DIAGNOSIS — F410 Panic disorder [episodic paroxysmal anxiety] without agoraphobia: Secondary | ICD-10-CM | POA: Diagnosis not present

## 2018-09-28 DIAGNOSIS — I10 Essential (primary) hypertension: Secondary | ICD-10-CM | POA: Diagnosis not present

## 2018-09-28 DIAGNOSIS — F5101 Primary insomnia: Secondary | ICD-10-CM | POA: Diagnosis not present

## 2018-09-28 DIAGNOSIS — K2981 Duodenitis with bleeding: Secondary | ICD-10-CM | POA: Diagnosis not present

## 2019-11-29 DIAGNOSIS — I2510 Atherosclerotic heart disease of native coronary artery without angina pectoris: Secondary | ICD-10-CM | POA: Diagnosis not present

## 2019-11-29 DIAGNOSIS — F410 Panic disorder [episodic paroxysmal anxiety] without agoraphobia: Secondary | ICD-10-CM | POA: Diagnosis not present

## 2019-11-29 DIAGNOSIS — I10 Essential (primary) hypertension: Secondary | ICD-10-CM | POA: Diagnosis not present

## 2019-11-29 DIAGNOSIS — K269 Duodenal ulcer, unspecified as acute or chronic, without hemorrhage or perforation: Secondary | ICD-10-CM | POA: Diagnosis not present

## 2023-06-01 NOTE — Progress Notes (Signed)
Patient was not seen for appt d/t no call, no show, or late arrival >10 mins past appt time.    Debera Lat PA [facility] [address] [place], [place] [address] [phone] (phone) [phone] (fax) [facility]  See past labs  Heme  Chem  Endocrine  Serology  Results Review (optional):1}   Objective    There were no vitals taken for this visit. {Insert last BP/Wt (optional):23777}{See vitals history (optional):1}   Physical Exam Vitals reviewed.  Constitutional:      General: He is not in acute distress.    Appearance: Normal appearance. He is not diaphoretic.  HENT:     Head: Normocephalic and atraumatic.  Eyes:     General: No scleral icterus.    Conjunctiva/sclera: Conjunctivae normal.  Cardiovascular:     Rate and Rhythm: Normal rate and regular rhythm.     Pulses: Normal pulses.     Heart sounds: Normal heart sounds. No murmur heard. Pulmonary:     Effort: Pulmonary effort is normal. No respiratory distress.     Breath sounds: Normal breath sounds. No wheezing or rhonchi.  Musculoskeletal:     Cervical back: Neck supple.     Right lower leg: No edema.     Left lower leg: No edema.  Lymphadenopathy:     Cervical: No cervical adenopathy.  Skin:    General: Skin is warm and dry.     Findings: No rash.  Neurological:     Mental Status: He is alert and oriented to person, place, and time. Mental status is at baseline.  Psychiatric:        Mood and Affect: Mood normal.        Behavior: Behavior normal.     Depression Screen     No data to display         No results found for any visits on 06/02/23.  Assessment & Plan                    Encounter to establish care Welcomed to our clinic Reviewed past medical hx, social hx, family hx and surgical hx Pt advised to send all vaccination records or screening   No follow-ups on file.    The patient was advised to call back or seek an in-person evaluation if the symptoms worsen or if the condition fails to improve as anticipated.  I discussed the assessment and treatment plan with the patient. The patient was provided an opportunity to ask questions and all were answered. The patient agreed with the plan and demonstrated an understanding of the instructions.  I, Debera Lat, PA-C have reviewed all documentation for this visit. The documentation on  06/02/23 for the exam, diagnosis, procedures, and orders are all accurate and complete.  Debera Lat, [facility], MMS [facility] [phone] (phone) [phone] (fax)  [facility]

## 2023-06-02 ENCOUNTER — Ambulatory Visit: Payer: Medicare Other | Admitting: Physician Assistant

## 2023-06-02 DIAGNOSIS — Z91199 Patient's noncompliance with other medical treatment and regimen due to unspecified reason: Secondary | ICD-10-CM
